# Patient Record
Sex: Female | Born: 1970
Health system: Southern US, Community
[De-identification: ages and names within clinical notes are randomized; demographics above are authoritative.]

## PROBLEM LIST (undated history)

## (undated) DIAGNOSIS — U071 COVID-19: Secondary | ICD-10-CM

## (undated) DIAGNOSIS — M797 Fibromyalgia: Secondary | ICD-10-CM

## (undated) DIAGNOSIS — F32A Depression, unspecified: Secondary | ICD-10-CM

## (undated) DIAGNOSIS — M1990 Unspecified osteoarthritis, unspecified site: Secondary | ICD-10-CM

## (undated) DIAGNOSIS — I10 Essential (primary) hypertension: Secondary | ICD-10-CM

## (undated) DIAGNOSIS — I2699 Other pulmonary embolism without acute cor pulmonale: Secondary | ICD-10-CM

## (undated) DIAGNOSIS — E119 Type 2 diabetes mellitus without complications: Secondary | ICD-10-CM

## (undated) DIAGNOSIS — K589 Irritable bowel syndrome without diarrhea: Secondary | ICD-10-CM

## (undated) DIAGNOSIS — F419 Anxiety disorder, unspecified: Secondary | ICD-10-CM

## (undated) HISTORY — PX: WRIST FRACTURE SURGERY: SHX121

## (undated) HISTORY — DX: Other pulmonary embolism without acute cor pulmonale: I26.99

## (undated) HISTORY — PX: OTHER SURGICAL HISTORY: SHX169

---
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
## 1999-07-09 ENCOUNTER — Ambulatory Visit (HOSPITAL_COMMUNITY): Admission: RE | Admit: 1999-07-09 | Discharge: 1999-07-09 | Payer: Self-pay | Admitting: Psychiatry

## 1999-07-20 ENCOUNTER — Ambulatory Visit (HOSPITAL_COMMUNITY): Admission: RE | Admit: 1999-07-20 | Discharge: 1999-07-20 | Payer: Self-pay | Admitting: Psychiatry

## 2001-12-07 ENCOUNTER — Encounter: Payer: Self-pay | Admitting: Emergency Medicine

## 2001-12-07 ENCOUNTER — Emergency Department (HOSPITAL_COMMUNITY): Admission: EM | Admit: 2001-12-07 | Discharge: 2001-12-07 | Payer: Self-pay | Admitting: Emergency Medicine

## 2003-06-23 ENCOUNTER — Emergency Department (HOSPITAL_COMMUNITY): Admission: EM | Admit: 2003-06-23 | Discharge: 2003-06-23 | Payer: Self-pay

## 2013-04-24 ENCOUNTER — Encounter (HOSPITAL_COMMUNITY): Payer: Self-pay | Admitting: Emergency Medicine

## 2013-04-24 ENCOUNTER — Emergency Department (HOSPITAL_COMMUNITY)
Admission: EM | Admit: 2013-04-24 | Discharge: 2013-04-24 | Disposition: A | Discharge status: Home / Self Care | Payer: BC Managed Care – PPO | Source: Home / Self Care | Attending: Family Medicine | Admitting: Family Medicine

## 2013-04-24 ENCOUNTER — Emergency Department (INDEPENDENT_AMBULATORY_CARE_PROVIDER_SITE_OTHER): Payer: BC Managed Care – PPO

## 2013-04-24 DIAGNOSIS — R0789 Other chest pain: Secondary | ICD-10-CM

## 2013-04-24 MED ORDER — DICLOFENAC POTASSIUM 50 MG PO TABS: 50.0000 mg | ORAL_TABLET | Freq: Three times a day (TID) | ORAL | Status: DC

## 2013-04-24 NOTE — ED Notes (Addendum)
Patient has concerned for weird feeling in chest, intermittent tightness, something doesn't feel right in chest.  Reports sitting in lobby felt fine.  Patient reports she feels fine until she starts thinking about chest and /or thinking of family/friends that have died recently or diagnosed with significant illnesses.  Patient recently returned to work out routine.  .  Patient reports the feeling is minor, its the location that concerns her.  Reports onset yesterday of symptoms

## 2013-04-24 NOTE — ED Provider Notes (Signed)
CSN: [id number]     Arrival date & time 04/24/13  0803 History   First MD Initiated Contact with Patient 04/24/13 [phone]     Chief Complaint  Patient presents with  . Muscle Pain   (Consider location/radiation/quality/duration/timing/severity/associated sxs/prior Treatment) Patient is a 42 y.o. female presenting with musculoskeletal pain. The history is provided by the patient.  Muscle Pain This is a new problem. The current episode started yesterday. The problem has been resolved. Associated symptoms include chest pain. Pertinent negatives include no abdominal pain, no headaches and no shortness of breath. Associated symptoms comments: 1 week ago started workout routine with weights, mother and friend with heart attakcks, pt worried.Marland Kitchen    History reviewed. No pertinent past medical history. History reviewed. No pertinent past surgical history. No family history on file. History  Substance Use Topics  . Smoking status: Current Every Day Smoker  . Smokeless tobacco: Not on file  . Alcohol Use: Yes   OB History   Grav Para Term Preterm Abortions TAB SAB Ect Mult Living                 Review of Systems  Constitutional: Negative.   Respiratory: Negative for cough, chest tightness and shortness of breath.   Cardiovascular: Positive for chest pain. Negative for palpitations and leg swelling.  Gastrointestinal: Negative.  Negative for abdominal pain.  Neurological: Negative for headaches.    Allergies  Review of patient's allergies indicates no known allergies.  Home Medications   Current Outpatient Rx  Name  Route  Sig  Dispense  Refill  . diclofenac (CATAFLAM) 50 MG tablet   Oral   Take 1 tablet (50 mg total) by mouth 3 (three) times daily. For chest pain   21 tablet   0    BP 154/86  Pulse 81  Temp(Src) 98.4 F (36.9 C) (Oral)  Resp 20  SpO2 98%  LMP 04/07/2013 Physical Exam  Nursing note and vitals reviewed. Constitutional: She is oriented to person, place, and  time. She appears well-developed and well-nourished.  HENT:  Head: Normocephalic.  Mouth/Throat: Oropharynx is clear and moist.  Eyes: Conjunctivae are normal. Pupils are equal, round, and reactive to light.  Neck: Normal range of motion. Neck supple.  Cardiovascular: Normal rate, regular rhythm, normal heart sounds and intact distal pulses.   Pulmonary/Chest: Effort normal and breath sounds normal. She exhibits tenderness.  Neurological: She is alert and oriented to person, place, and time.  Skin: Skin is warm and dry.    ED Course  Procedures (including critical care time) Labs Review Labs Reviewed - No data to display Imaging Review Dg Chest 2 View  04/24/2013   *RADIOLOGY REPORT*  Clinical Data: Left-sided chest pain, history smoking  CHEST - 2 VIEW  Comparison: None.  Findings:  Examination is degraded secondary to patient body habitus.  Normal cardiac silhouette and mediastinal contours. Lungs appear mildly hyperexpanded with mild diffuse slightly nodular thickening of the interstitium. Vertically aligned skin folds overlie the peripheral aspects of the bilateral lower lungs.  No definite pneumothorax.  No pleural effusion or evidence of pulmonary edema. No acute osseous abnormality.  IMPRESSION: Mild lung hyperexpansion and bronchitic change without acute cardiopulmonary disease.   Original Report Authenticated By: Tacey Ruiz, MD    MDM   1. Musculoskeletal chest pain    X-rays reviewed and report per radiologist.    Linna Hoff, MD 04/24/13 [phone]

## 2013-07-06 ENCOUNTER — Other Ambulatory Visit (HOSPITAL_COMMUNITY)
Admission: RE | Admit: 2013-07-06 | Discharge: 2013-07-06 | Disposition: A | Discharge status: Home / Self Care | Payer: BC Managed Care – PPO | Source: Ambulatory Visit | Attending: Family Medicine | Admitting: Family Medicine

## 2013-07-06 ENCOUNTER — Other Ambulatory Visit: Payer: Self-pay | Admitting: Family Medicine

## 2013-07-06 DIAGNOSIS — Z0000 Encounter for general adult medical examination without abnormal findings: Secondary | ICD-10-CM | POA: Insufficient documentation

## 2013-07-10 ENCOUNTER — Other Ambulatory Visit: Payer: Self-pay | Admitting: Family Medicine

## 2013-07-10 DIAGNOSIS — Z1231 Encounter for screening mammogram for malignant neoplasm of breast: Secondary | ICD-10-CM

## 2013-08-01 ENCOUNTER — Other Ambulatory Visit: Payer: Self-pay | Admitting: Family Medicine

## 2013-08-01 ENCOUNTER — Ambulatory Visit
Admission: RE | Admit: 2013-08-01 | Discharge: 2013-08-01 | Disposition: A | Discharge status: Home / Self Care | Payer: BC Managed Care – PPO | Source: Ambulatory Visit | Attending: Family Medicine | Admitting: Family Medicine

## 2013-08-01 DIAGNOSIS — M79609 Pain in unspecified limb: Secondary | ICD-10-CM

## 2013-08-23 DIAGNOSIS — I10 Essential (primary) hypertension: Secondary | ICD-10-CM

## 2013-08-23 HISTORY — DX: Essential (primary) hypertension: I10

## 2013-08-24 ENCOUNTER — Ambulatory Visit
Admission: RE | Admit: 2013-08-24 | Discharge: 2013-08-24 | Disposition: A | Discharge status: Home / Self Care | Payer: BC Managed Care – PPO | Source: Ambulatory Visit | Attending: Family Medicine | Admitting: Family Medicine

## 2013-08-24 DIAGNOSIS — Z1231 Encounter for screening mammogram for malignant neoplasm of breast: Secondary | ICD-10-CM

## 2013-08-28 ENCOUNTER — Other Ambulatory Visit: Payer: Self-pay | Admitting: Family Medicine

## 2013-08-28 DIAGNOSIS — N644 Mastodynia: Secondary | ICD-10-CM

## 2013-08-28 DIAGNOSIS — N63 Unspecified lump in unspecified breast: Secondary | ICD-10-CM

## 2013-09-05 ENCOUNTER — Ambulatory Visit
Admission: RE | Admit: 2013-09-05 | Discharge: 2013-09-05 | Disposition: A | Discharge status: Home / Self Care | Payer: BC Managed Care – PPO | Source: Ambulatory Visit | Attending: Family Medicine | Admitting: Family Medicine

## 2013-09-05 DIAGNOSIS — N63 Unspecified lump in unspecified breast: Secondary | ICD-10-CM

## 2013-09-05 DIAGNOSIS — N644 Mastodynia: Secondary | ICD-10-CM

## 2014-07-29 ENCOUNTER — Encounter (HOSPITAL_COMMUNITY): Payer: Self-pay

## 2014-07-29 ENCOUNTER — Emergency Department (HOSPITAL_COMMUNITY)
Admission: EM | Admit: 2014-07-29 | Discharge: 2014-07-29 | Disposition: A | Discharge status: Home / Self Care | Payer: BC Managed Care – PPO | Source: Home / Self Care | Attending: Family Medicine | Admitting: Family Medicine

## 2014-07-29 DIAGNOSIS — G5601 Carpal tunnel syndrome, right upper limb: Secondary | ICD-10-CM | POA: Diagnosis not present

## 2014-07-29 NOTE — ED Provider Notes (Signed)
CSN: [id number]     Arrival date & time 07/29/14  1255 History   First MD Initiated Contact with Patient 07/29/14 1407     Chief Complaint  Patient presents with  . Hand Problem   (Consider location/radiation/quality/duration/timing/severity/associated sxs/prior Treatment) Patient is a 43 y.o. female presenting with hand pain. The history is provided by the patient.  Hand Pain This is a new problem. The current episode started 6 to 12 hours ago. 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 no definite injury.). Pertinent negatives include no chest pain and no abdominal pain.    History reviewed. No pertinent past medical history. History reviewed. No pertinent past surgical history. History reviewed. No pertinent family history. History  Substance Use Topics  . Smoking status: Current Every Day Smoker  . Smokeless tobacco: Not on file  . Alcohol Use: Yes   OB History    No data available     Review of Systems  Constitutional: Negative.   Cardiovascular: Negative for chest pain.  Gastrointestinal: Negative for abdominal pain.  Musculoskeletal: Negative.   Skin: Negative.     Allergies  Review of patient's allergies indicates no known allergies.  Home Medications   Prior to Admission medications   Medication Sig Start Date End Date Taking? Authorizing Provider  diclofenac (CATAFLAM) 50 MG tablet Take 1 tablet (50 mg total) by mouth 3 (three) times daily. For chest pain 04/24/13   Linna HoffJames D Kitrina Maurin, MD   BP 154/95 mmHg  Pulse 90  Temp(Src) 99.1 F (37.3 C) (Oral)  Resp 18  SpO2 97% Physical Exam  Constitutional: She is oriented to person, place, and time. She appears well-developed and well-nourished.  Musculoskeletal: Normal range of motion. She exhibits tenderness.  No changes except increased sens to palp of finger., full rom.. Nl warmth.  Neurological: She is alert and  oriented to person, place, and time.  Skin: Skin is warm and dry. No rash noted. No erythema.  Nursing note and vitals reviewed.   ED Course  Procedures (including critical care time) Labs Review Labs Reviewed - No data to display  Imaging Review No results found.   MDM   1. Carpal tunnel syndrome on right        Linna HoffJames D Quiara Killian, MD 07/29/14 1455

## 2014-07-29 NOTE — ED Notes (Signed)
C/o episodic numbness, pain, swelling, hot/cold sensations in her right hand. 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 Denies injury

## 2015-04-05 ENCOUNTER — Encounter (HOSPITAL_COMMUNITY): Payer: Self-pay | Admitting: Emergency Medicine

## 2015-04-05 ENCOUNTER — Emergency Department (HOSPITAL_COMMUNITY)
Admission: EM | Admit: 2015-04-05 | Discharge: 2015-04-05 | Disposition: A | Discharge status: Home / Self Care | Payer: 59 | Attending: Emergency Medicine | Admitting: Emergency Medicine

## 2015-04-05 ENCOUNTER — Emergency Department (HOSPITAL_COMMUNITY): Payer: 59

## 2015-04-05 DIAGNOSIS — R1011 Right upper quadrant pain: Secondary | ICD-10-CM | POA: Diagnosis present

## 2015-04-05 DIAGNOSIS — Z79899 Other long term (current) drug therapy: Secondary | ICD-10-CM | POA: Diagnosis not present

## 2015-04-05 DIAGNOSIS — I10 Essential (primary) hypertension: Secondary | ICD-10-CM | POA: Diagnosis not present

## 2015-04-05 DIAGNOSIS — R000 Tachycardia, unspecified: Secondary | ICD-10-CM | POA: Insufficient documentation

## 2015-04-05 DIAGNOSIS — K8020 Calculus of gallbladder without cholecystitis without obstruction: Secondary | ICD-10-CM

## 2015-04-05 DIAGNOSIS — Z720 Tobacco use: Secondary | ICD-10-CM | POA: Diagnosis not present

## 2015-04-05 HISTORY — DX: Essential (primary) hypertension: I10

## 2015-04-05 LAB — CBC WITH DIFFERENTIAL/PLATELET
Basophils Absolute: 0 10*3/uL (ref 0.0–0.1)
Basophils Relative: 0 % (ref 0–1)
Eosinophils Absolute: 0.1 10*3/uL (ref 0.0–0.7)
Eosinophils Relative: 1 % (ref 0–5)
HCT: 43.8 % (ref 36.0–46.0)
Hemoglobin: 14.2 g/dL (ref 12.0–15.0)
Lymphocytes Relative: 21 % (ref 12–46)
Lymphs Abs: 2.8 10*3/uL (ref 0.7–4.0)
MCH: 28.1 pg (ref 26.0–34.0)
MCHC: 32.4 g/dL (ref 30.0–36.0)
MCV: 86.6 fL (ref 78.0–100.0)
Monocytes Absolute: 0.6 10*3/uL (ref 0.1–1.0)
Monocytes Relative: 4 % (ref 3–12)
Neutro Abs: 10.1 10*3/uL — ABNORMAL HIGH (ref 1.7–7.7)
Neutrophils Relative %: 74 % (ref 43–77)
Platelets: 431 10*3/uL — ABNORMAL HIGH (ref 150–400)
RBC: 5.06 MIL/uL (ref 3.87–5.11)
RDW: 14.5 % (ref 11.5–15.5)
WBC: 13.6 10*3/uL — ABNORMAL HIGH (ref 4.0–10.5)

## 2015-04-05 LAB — COMPREHENSIVE METABOLIC PANEL
ALT: 14 U/L (ref 14–54)
AST: 19 U/L (ref 15–41)
Albumin: 3.9 g/dL (ref 3.5–5.0)
Alkaline Phosphatase: 75 U/L (ref 38–126)
Anion gap: 10 (ref 5–15)
BUN: 13 mg/dL (ref 6–20)
CO2: 23 mmol/L (ref 22–32)
Calcium: 9 mg/dL (ref 8.9–10.3)
Chloride: 103 mmol/L (ref 101–111)
Creatinine, Ser: 0.61 mg/dL (ref 0.44–1.00)
GFR calc Af Amer: >60 mL/min (ref >60)
GFR calc non Af Amer: >60 mL/min (ref >60)
Glucose, Bld: 110 mg/dL — ABNORMAL HIGH (ref 65–99)
Potassium: 3.5 mmol/L (ref 3.5–5.1)
Sodium: 136 mmol/L (ref 135–145)
Total Bilirubin: 0.7 mg/dL (ref 0.3–1.2)
Total Protein: 7.9 g/dL (ref 6.5–8.1)

## 2015-04-05 LAB — I-STAT CG4 LACTIC ACID, ED: Lactic Acid, Venous: 2.15 mmol/L (ref 0.5–2.0)

## 2015-04-05 LAB — I-STAT CHEM 8, ED
BUN: 13 mg/dL (ref 6–20)
Calcium, Ion: 1.12 mmol/L (ref 1.12–1.23)
Chloride: 103 mmol/L (ref 101–111)
Creatinine, Ser: 0.6 mg/dL (ref 0.44–1.00)
Glucose, Bld: 112 mg/dL — ABNORMAL HIGH (ref 65–99)
HCT: 48 % — ABNORMAL HIGH (ref 36.0–46.0)
Hemoglobin: 16.3 g/dL — ABNORMAL HIGH (ref 12.0–15.0)
Potassium: 3.5 mmol/L (ref 3.5–5.1)
Sodium: 138 mmol/L (ref 135–145)
TCO2: 21 mmol/L (ref 0–100)

## 2015-04-05 LAB — LIPASE, BLOOD: Lipase: 16 U/L — ABNORMAL LOW (ref 22–51)

## 2015-04-05 MED ORDER — KETOROLAC TROMETHAMINE 30 MG/ML IJ SOLN
30.0000 mg | Freq: Once | INTRAMUSCULAR | Status: AC
  Administered 2015-04-05: 30 mg via INTRAVENOUS
  Filled 2015-04-05: qty 1

## 2015-04-05 MED ORDER — ONDANSETRON 4 MG PO TBDP: 4.0000 mg | ORAL_TABLET | Freq: Three times a day (TID) | ORAL | Status: DC | PRN

## 2015-04-05 MED ORDER — SODIUM CHLORIDE 0.9 % IV BOLUS (SEPSIS)
1000.0000 mL | Freq: Once | INTRAVENOUS | Status: AC
  Administered 2015-04-05: 1000 mL via INTRAVENOUS

## 2015-04-05 MED ORDER — TRAMADOL HCL 50 MG PO TABS: 50.0000 mg | ORAL_TABLET | Freq: Two times a day (BID) | ORAL | Status: DC | PRN

## 2015-04-05 NOTE — Discharge Instructions (Signed)
Cholelithiasis Holly Moon, your ultrasound results are below. Gallstones are causing your significant pain. Decrease your intake of fatty meals and spicy foods. Take tramadol for breakthrough pain. Use zofran as needed for nausea.  See surgery within 3 days for close follow up.  If symptoms worsen, come back to the ED immediately. Thank you. Cholelithiasis (also called gallstones) is a form of gallbladder disease. The gallbladder is a small organ that helps you digest fats. Symptoms of gallstones are:  Feeling sick to your stomach (nausea).  Throwing up (vomiting).  Belly pain.  Yellowing of the skin (jaundice).  Sudden pain. You may feel the pain for minutes to hours.  Fever.  Pain to the touch. HOME CARE  Only take medicines as told by your doctor.  Eat a low-fat diet until you see your doctor again. Eating fat can result in pain.  Follow up with your doctor as told. Attacks usually happen time after time. Surgery is usually needed for permanent treatment. GET HELP RIGHT AWAY IF:   Your pain gets worse.  Your pain is not helped by medicines.  You have a fever and lasting symptoms for more than 2-3 days.  You have a fever and your symptoms suddenly get worse.  You keep feeling sick to your stomach and throwing up. MAKE SURE YOU:   Understand these instructions.  Will watch your condition.  Will get help right away if you are not doing well or get worse. Document Released: 01/26/2008 Document Revised: 04/11/2013 Document Reviewed: 01/31/2013 [facility] Patient Information 2015 [place], [place]. This information is not intended to replace advice given to you by your health care provider. Make sure you discuss any questions you have with your health care provider.

## 2015-04-05 NOTE — ED Notes (Signed)
Pt returned from ultrasound

## 2015-04-05 NOTE — ED Notes (Signed)
Pt states she has pain under right rib and sometimes it transfers across under breast to left side

## 2015-04-05 NOTE — ED Provider Notes (Signed)
CSN: [id number]   Arrival date & time 04/05/15 0050  History  This chart was scribed for  Tomasita Crumble, MD by Bethel Born, ED Scribe. This patient was seen in room [facility] and the patient's care was started at 3:23 AM.  Chief Complaint  Patient presents with  . Flank Pain    HPI The history is provided by the patient. No language interpreter was used.   Holly Moon is a 44 y.o. female who presents to the Emergency Department complaining of constant right upper abdominal pain with sudden onset around 7 AM yesterday. The pain radiates to the back and across the abdomen.She describes the pain as burning and stabbing. Laying flat and deep breathing exacerbate the pain. Associated symptoms include nausea, heartburn, and anorexia (states that she last ate on 04/03/15). Pt denies vomiting and current diarrhea (notes an episode last week while on Augmentin for an ear infection), fever, dysuria, and hematuria. She saw her PCP at [facility] on 03/31/15 where she had lab work done.   Past Medical History  Diagnosis Date  . Hypertension 2015    Past Surgical History  Procedure Laterality Date  . Fracture surgery Left 1987    leg fracture    History reviewed. No pertinent family history.  Social History  Substance Use Topics  . Smoking status: Current Every Day Smoker  . Smokeless tobacco: None  . Alcohol Use: Yes     Review of Systems 10 Systems reviewed and all are negative for acute change except as noted in the HPI.  Home Medications   Prior to Admission medications   Medication Sig Start Date End Date Taking? Authorizing Provider  benazepril (LOTENSIN) 20 MG tablet Take 20 mg by mouth daily.   Yes Historical Provider, MD  buPROPion (WELLBUTRIN XL) 150 MG 24 hr tablet Take 150 mg by mouth daily.   Yes Historical Provider, MD  escitalopram (LEXAPRO) 20 MG tablet Take 20 mg by mouth daily.   Yes Historical Provider, MD  diclofenac (CATAFLAM) 50 MG tablet Take 1 tablet (50  mg total) by mouth 3 (three) times daily. For chest pain Patient not taking: Reported on 04/05/2015 04/24/13   Linna Hoff, MD    Allergies  Augmentin  Triage Vitals: BP 170/111 mmHg  Pulse 118  Temp(Src) 98.7 F (37.1 C) (Oral)  Resp 20  SpO2 98%  LMP 04/03/2015  Physical Exam  Constitutional: She is oriented to person, place, and time. She appears well-developed and well-nourished. No distress.  HENT:  Head: Normocephalic and atraumatic.  Nose: Nose normal.  Mouth/Throat: Oropharynx is clear and moist. No oropharyngeal exudate.  Eyes: Conjunctivae and EOM are normal. Pupils are equal, round, and reactive to light. No scleral icterus.  Neck: Normal range of motion. Neck supple. No JVD present. No tracheal deviation present. No thyromegaly present.  Cardiovascular: Regular rhythm and normal heart sounds.  Exam reveals no gallop and no friction rub.   No murmur heard. Tachycardia  Pulmonary/Chest: Effort normal and breath sounds normal. No respiratory distress. She has no wheezes. She exhibits no tenderness.  Abdominal: Soft. Bowel sounds are normal. She exhibits no distension and no mass. There is no tenderness. There is no rebound and no guarding.  Musculoskeletal: Normal range of motion. She exhibits no edema or tenderness.  Lymphadenopathy:    She has no cervical adenopathy.  Neurological: She is alert and oriented to person, place, and time. No cranial nerve deficit. She exhibits normal muscle tone.  Skin: Skin is  warm and dry. No rash noted. No erythema. No pallor.  Nursing note and vitals reviewed.   ED Course  Procedures   DIAGNOSTIC STUDIES: Oxygen Saturation is 98% on RA, normal by my interpretation.    COORDINATION OF CARE: 3:27 AM Discussed treatment plan which includes lab work, abdominal US, Toradol, and IVF with pt at bedside and pt agreed to plan.  Labs Review-  Labs Reviewed  CBC WITH DIFFERENTIAL/PLATELET - Abnormal; Notable for the following:    WBC  13.6 (*)    Platelets 431 (*)    Neutro Abs 10.1 (*)    All other components within normal limits  COMPREHENSIVE METABOLIC PANEL - Abnormal; Notable for the following:    Glucose, Bld 110 (*)    All other components within normal limits  LIPASE, BLOOD - Abnormal; Notable for the following:    Lipase 16 (*)    All other components within normal limits  I-STAT CHEM 8, ED - Abnormal; Notable for the following:    Glucose, Bld 112 (*)    Hemoglobin 16.3 (*)    HCT 48.0 (*)    All other components within normal limits  I-STAT CG4 LACTIC ACID, ED - Abnormal; Notable for the following:    Lactic Acid, Venous 2.15 (*)    All other components within normal limits    Imaging Review US Abdomen Complete  04/05/2015   CLINICAL DATA:  Acute onset of right upper quadrant and right flank pain. Initial encounter.  EXAM: ULTRASOUND ABDOMEN COMPLETE  COMPARISON:  None.  FINDINGS: Gallbladder: Multiple stones are seen dependently within the gallbladder, measuring up to 1.4 cm in size. No gallbladder wall thickening or pericholecystic fluid is seen. No ultrasonographic Murphy's sign is elicited.  Common bile duct: Diameter: 0.4 cm, within normal limits in caliber.  Liver: No focal lesion identified. Within normal limits in parenchymal echogenicity.  IVC: No abnormality visualized.  Pancreas: Not visualized.  Spleen: Size and appearance within normal limits.  Right Kidney: Length: 12.3 cm. Echogenicity within normal limits. No mass or hydronephrosis visualized.  Left Kidney: Length: 11.7 cm. Echogenicity within normal limits. No mass or hydronephrosis visualized.  Abdominal aorta: No aneurysm visualized. Not characterized proximally due to overlying bowel gas.  Other findings: None.  IMPRESSION: 1. Cholelithiasis noted; gallbladder otherwise unremarkable. 2. Otherwise unremarkable abdominal ultrasound.   Electronically Signed   By: Roanna Raider M.D.   On: 04/05/2015 05:01    EKG  Interpretation None      MDM   Final diagnoses:  None    Patient presents emergency department for right upper quadrant abdominal pain which radiates to her flank and mid abdomen. She denies it being worse with food however I have concern for gallbladder pathology. Will obtain ultrasound for evaluation. Patient was given IV fluids, and Toradol for pain control.  Ultrasound shows several gallstones in the gallbladder. This is likely the cause of her symptoms. Patient was given education regarding this diagnosis. She demonstrated understanding. Surgical follow-up was provided. Tachycardia has resolved to 70 after 1 L of IV fluids and Toradol. She'll be discharged with tramadol and Zofran to take at home as needed. She otherwise appears well and in no acute distress. Her vital signs were within her normal limits and she is safe for discharge.  I personally performed the services described in this documentation, which was scribed in my presence. The recorded information has been reviewed and is accurate.   Tomasita Crumble, MD 04/05/15 [phone]

## 2015-04-05 NOTE — ED Notes (Signed)
Pt arrived to the ED with a complaint of right sided flank pain with radiation to the both the upper back and abdomen.  Pt has experienced pain since Friday at 0700 hrs.  Pt states the pain is a throbbing pressure pain.

## 2016-11-07 ENCOUNTER — Encounter (HOSPITAL_COMMUNITY): Payer: Self-pay | Admitting: Emergency Medicine

## 2016-11-07 ENCOUNTER — Emergency Department (HOSPITAL_COMMUNITY): Payer: BLUE CROSS/BLUE SHIELD

## 2016-11-07 ENCOUNTER — Emergency Department (HOSPITAL_COMMUNITY)
Admission: EM | Admit: 2016-11-07 | Discharge: 2016-11-07 | Disposition: A | Discharge status: Home / Self Care | Payer: BLUE CROSS/BLUE SHIELD | Attending: Emergency Medicine | Admitting: Emergency Medicine

## 2016-11-07 DIAGNOSIS — F17200 Nicotine dependence, unspecified, uncomplicated: Secondary | ICD-10-CM | POA: Insufficient documentation

## 2016-11-07 DIAGNOSIS — I10 Essential (primary) hypertension: Secondary | ICD-10-CM | POA: Insufficient documentation

## 2016-11-07 DIAGNOSIS — K5732 Diverticulitis of large intestine without perforation or abscess without bleeding: Secondary | ICD-10-CM | POA: Diagnosis not present

## 2016-11-07 DIAGNOSIS — Z7982 Long term (current) use of aspirin: Secondary | ICD-10-CM | POA: Diagnosis not present

## 2016-11-07 DIAGNOSIS — R1032 Left lower quadrant pain: Secondary | ICD-10-CM | POA: Diagnosis present

## 2016-11-07 DIAGNOSIS — R1031 Right lower quadrant pain: Secondary | ICD-10-CM

## 2016-11-07 LAB — URINALYSIS, ROUTINE W REFLEX MICROSCOPIC
Bilirubin Urine: NEGATIVE
Glucose, UA: NEGATIVE mg/dL
Hgb urine dipstick: NEGATIVE
Ketones, ur: 20 mg/dL — AB
Leukocytes, UA: NEGATIVE
Nitrite: NEGATIVE
Protein, ur: NEGATIVE mg/dL
Specific Gravity, Urine: 1.02 (ref 1.005–1.030)
pH: 5 (ref 5.0–8.0)

## 2016-11-07 LAB — COMPREHENSIVE METABOLIC PANEL
ALT: 15 U/L (ref 14–54)
AST: 15 U/L (ref 15–41)
Albumin: 4 g/dL (ref 3.5–5.0)
Alkaline Phosphatase: 54 U/L (ref 38–126)
Anion gap: 7 (ref 5–15)
BUN: 16 mg/dL (ref 6–20)
CO2: 25 mmol/L (ref 22–32)
Calcium: 9.4 mg/dL (ref 8.9–10.3)
Chloride: 106 mmol/L (ref 101–111)
Creatinine, Ser: 0.73 mg/dL (ref 0.44–1.00)
GFR calc Af Amer: >60 mL/min (ref >60)
GFR calc non Af Amer: >60 mL/min (ref >60)
Glucose, Bld: 112 mg/dL — ABNORMAL HIGH (ref 65–99)
Potassium: 3.7 mmol/L (ref 3.5–5.1)
Sodium: 138 mmol/L (ref 135–145)
Total Bilirubin: 0.6 mg/dL (ref 0.3–1.2)
Total Protein: 7.6 g/dL (ref 6.5–8.1)

## 2016-11-07 LAB — PREGNANCY, URINE: Preg Test, Ur: NEGATIVE

## 2016-11-07 LAB — CBC
HCT: 45 % (ref 36.0–46.0)
Hemoglobin: 14.8 g/dL (ref 12.0–15.0)
MCH: 28.2 pg (ref 26.0–34.0)
MCHC: 32.9 g/dL (ref 30.0–36.0)
MCV: 85.7 fL (ref 78.0–100.0)
Platelets: 485 10*3/uL — ABNORMAL HIGH (ref 150–400)
RBC: 5.25 MIL/uL — ABNORMAL HIGH (ref 3.87–5.11)
RDW: 15.5 % (ref 11.5–15.5)
WBC: 13.2 10*3/uL — ABNORMAL HIGH (ref 4.0–10.5)

## 2016-11-07 LAB — LIPASE, BLOOD: Lipase: 14 U/L (ref 11–51)

## 2016-11-07 LAB — WET PREP, GENITAL
Sperm: NONE SEEN
Trich, Wet Prep: NONE SEEN
Yeast Wet Prep HPF POC: NONE SEEN

## 2016-11-07 MED ORDER — HYDROCODONE-ACETAMINOPHEN 5-325 MG PO TABS: 1.0000 | ORAL_TABLET | ORAL | 0 refills | Status: DC | PRN

## 2016-11-07 MED ORDER — MORPHINE SULFATE (PF) 4 MG/ML IV SOLN
4.0000 mg | Freq: Once | INTRAVENOUS | Status: AC
  Administered 2016-11-07: 4 mg via INTRAVENOUS
  Filled 2016-11-07: qty 1

## 2016-11-07 MED ORDER — CIPROFLOXACIN HCL 500 MG PO TABS
500.0000 mg | ORAL_TABLET | Freq: Once | ORAL | Status: AC
  Administered 2016-11-07: 500 mg via ORAL
  Filled 2016-11-07: qty 1

## 2016-11-07 MED ORDER — SODIUM CHLORIDE 0.9 % IV BOLUS (SEPSIS)
1000.0000 mL | Freq: Once | INTRAVENOUS | Status: AC
  Administered 2016-11-07: 1000 mL via INTRAVENOUS

## 2016-11-07 MED ORDER — METRONIDAZOLE 500 MG PO TABS
500.0000 mg | ORAL_TABLET | Freq: Once | ORAL | Status: AC
  Administered 2016-11-07: 500 mg via ORAL
  Filled 2016-11-07: qty 1

## 2016-11-07 MED ORDER — IOPAMIDOL (ISOVUE-300) INJECTION 61%
INTRAVENOUS | Status: AC
  Administered 2016-11-07: 100 mL
  Filled 2016-11-07: qty 100

## 2016-11-07 MED ORDER — CIPROFLOXACIN HCL 500 MG PO TABS: 500.0000 mg | ORAL_TABLET | Freq: Two times a day (BID) | ORAL | 0 refills | Status: DC

## 2016-11-07 MED ORDER — METRONIDAZOLE 500 MG PO TABS: 500.0000 mg | ORAL_TABLET | Freq: Three times a day (TID) | ORAL | 0 refills | Status: DC

## 2016-11-07 NOTE — ED Provider Notes (Signed)
[facility]-EMERGENCY DEPT Provider Note   CSN: [id number] Arrival date & time: 11/07/16  1802     History   Chief Complaint Chief Complaint  Patient presents with  . Abdominal Pain  . Urinary Frequency    HPI Holly Moon is a 46 y.o. female G0 presenting with 1 day of lower abdominal pain which is worse with movement and better when lying down predominantly on the left side and radiating across her lower abdomen. She also reports nausea from the pain but no vomiting. Has had urinary frequency but no burning or pain with urination. She reports history of constipation but has had a normal bowel movement yesterday. She also reports a recent history of  participating in [employer] and change in diet. She has not tried anything for the pain. Denies fever, chills, diarrhea, hematuria, blood in her stool. LMP 10/21/2016 HPI  Past Medical History:  Diagnosis Date  . Hypertension 2015    There are no active problems to display for this patient.   Past Surgical History:  Procedure Laterality Date  . FRACTURE SURGERY Left 1987   leg fracture    OB History    No data available       Home Medications    Prior to Admission medications   Medication Sig Start Date End Date Taking? Authorizing Provider  aspirin EC 325 MG tablet Take 325 mg by mouth daily as needed (headache).   Yes Historical Provider, MD  benazepril (LOTENSIN) 20 MG tablet Take 20 mg by mouth daily.   Yes Historical Provider, MD    Family History No family history on file.  Social History Social History  Substance Use Topics  . Smoking status: Current Every Day Smoker    Packs/day: 0.05  . Smokeless tobacco: Not on file  . Alcohol use Yes     Comment: very rarely      Allergies   Patient has no active allergies.   Review of Systems Review of Systems  Constitutional: Negative for appetite change, chills and fever.  HENT: Negative for congestion, ear pain and sore throat.   Eyes: Negative for  visual disturbance.  Respiratory: Negative for cough, chest tightness, shortness of breath, wheezing and stridor.   Cardiovascular: Negative for chest pain, palpitations and leg swelling.  Gastrointestinal: Positive for abdominal pain, constipation and nausea. Negative for abdominal distention, anal bleeding, blood in stool, diarrhea and vomiting.       LLQ  /suprapubic pain   Genitourinary: Positive for frequency. Negative for decreased urine volume, difficulty urinating, dysuria, flank pain, genital sores, hematuria, menstrual problem, urgency, vaginal bleeding, vaginal discharge and vaginal pain.  Musculoskeletal: Negative for back pain, gait problem, myalgias, neck pain and neck stiffness.  Skin: Negative for color change, pallor, rash and wound.  Neurological: Negative for dizziness, seizures, syncope, weakness, light-headedness and headaches.  Psychiatric/Behavioral: Negative for behavioral problems.     Physical Exam Updated Vital Signs BP 140/79 (BP Location: Left Arm)   Pulse (!) 101   Temp 98.1 F (36.7 C) (Oral)   Resp 18   Ht 5\' 5"  (1.651 m)   Wt 127 kg   SpO2 98%   BMI 46.59 kg/m   Physical Exam  Constitutional: She appears well-developed and well-nourished. No distress.  Patient is afebrile, nontoxic-appearing, lying in bed in mild discomfort in no acute distress.  HENT:  Head: Normocephalic and atraumatic.  Eyes: Conjunctivae are normal.  Neck: Normal range of motion. Neck supple.  Cardiovascular: Normal rate, regular rhythm  and normal heart sounds.   No murmur heard. Pulmonary/Chest: Effort normal and breath sounds normal. No respiratory distress. She has no wheezes. She has no rales. She exhibits no tenderness.  Abdominal: Soft. Bowel sounds are normal. She exhibits no distension and no mass. There is tenderness. There is no rebound and no guarding.  Mild left lower suprapubic tenderness to palpation.  Genitourinary: Vagina normal. No vaginal discharge found.    Genitourinary Comments: Some white discharge. No cervical motion tenderness. Adnexal mass or tenderness to palpation of adnexa.  Musculoskeletal: She exhibits no edema.  Neurological: She is alert.  Skin: Skin is warm. No rash noted. She is not diaphoretic. No erythema. No pallor.  Psychiatric: She has a normal mood and affect.  Nursing note and vitals reviewed.    ED Treatments / Results  Labs (all labs ordered are listed, but only abnormal results are displayed) Labs Reviewed  WET PREP, GENITAL - Abnormal; Notable for the following:       Result Value   Clue Cells Wet Prep HPF POC PRESENT (*)    WBC, Wet Prep HPF POC FEW (*)    All other components within normal limits  COMPREHENSIVE METABOLIC PANEL - Abnormal; Notable for the following:    Glucose, Bld 112 (*)    All other components within normal limits  CBC - Abnormal; Notable for the following:    WBC 13.2 (*)    RBC 5.25 (*)    Platelets 485 (*)    All other components within normal limits  URINALYSIS, ROUTINE W REFLEX MICROSCOPIC - Abnormal; Notable for the following:    Ketones, ur 20 (*)    All other components within normal limits  LIPASE, BLOOD  PREGNANCY, URINE  GC/CHLAMYDIA PROBE AMP ([place]) NOT AT [facility]    EKG  EKG Interpretation None       Radiology Ct Abdomen Pelvis W Contrast  Result Date: 11/07/2016 CLINICAL DATA:  Left-sided abdominal pain. EXAM: CT ABDOMEN AND PELVIS WITH CONTRAST TECHNIQUE: Multidetector CT imaging of the abdomen and pelvis was performed using the standard protocol following bolus administration of intravenous contrast. CONTRAST:  ISOVUE-300 IOPAMIDOL (ISOVUE-300) INJECTION 61% COMPARISON:  Abdominal ultrasound 04/05/2015 FINDINGS: Lower chest: No acute abnormality. Hepatobiliary: No focal liver abnormality is seen. No gallbladder wall thickening, or biliary dilatation. Biphasic attenuation of the gallbladder suggests layering gallbladder sludge or stones. Pancreas:  Unremarkable. No pancreatic ductal dilatation or surrounding inflammatory changes. Spleen: Normal in size without focal abnormality. Adrenals/Urinary Tract: Adrenal glands are unremarkable. Kidneys are normal, without renal calculi, focal lesion, or hydronephrosis. Bladder is unremarkable. Stomach/Bowel: Stomach is within normal limits. No evidence of appendicitis. No evidence of small bowel wall thickening, distention, or inflammatory changes. Diffuse left colonic diverticulosis with a relatively long segment of circumferential mucosal thickening of the sigmoid colon with pericolonic inflammatory fat stranding, particularly in the proximal sigmoid colon. Findings suggestive of diverticulitis. No evidence of abscess formation or rupture. Vascular/Lymphatic: No significant vascular findings are present. No enlarged abdominal or pelvic lymph nodes. Reproductive: Uterus and bilateral adnexa are unremarkable. Other: No abdominal wall hernia or abnormality. No abdominopelvic ascites. Musculoskeletal: No acute or significant osseous findings. IMPRESSION: Long segment of irregular mucosal thickening and pericolonic inflammatory changes of the sigmoid colon, on the background of advanced diverticulosis. The findings are most suggestive of diverticulitis, without evidence of rupture or abscess formation. Although malignancy cannot be entirely excluded, it is considered less likely with this appearance. Correlation with colonoscopy, after resolution of the acute  symptoms is recommended. Electronically Signed   By: Ted Mcalpineobrinka  Dimitrova M.D.   On: 11/07/2016 22:04    Procedures Procedures (including critical care time)  Medications Ordered in ED Medications  sodium chloride 0.9 % bolus 1,000 mL (0 mLs Intravenous Stopped 11/07/16 2200)  morphine 4 MG/ML injection 4 mg (4 mg Intravenous Given 11/07/16 2019)  iopamidol (ISOVUE-300) 61 % injection (100 mLs  Contrast Given 11/07/16 2141)  sodium chloride 0.9 % bolus 1,000 mL  (1,000 mLs Intravenous New Bag/Given 11/07/16 2202)     Initial Impression / Assessment and Plan / ED Course  I have reviewed the triage vital signs and the nursing notes.  Pertinent labs & imaging results that were available during my care of the patient were reviewed by me and considered in my medical decision making (see chart for details).   Patient presents with one days of lower suprapubic discomfort and urinary frequency. Tachycardic on arrival. Given IV fluids. Exam unremarkable, other than mild discomfort to palpation of LLQ/ lower suprapubic region > on the left.  Labs with mild leukocytosis Pelvic exam without abnormal discharge or cervical motion tenderness  Patient's pain was managed while in ED. She reported improvement on reassessment. Patient was discussed with Dr. Erma HeritageIsaacs who agrees with assessment and plan.  Ordered CT abdomen and pelvis  Transferred patient care at end of shift to [place], PA-C pending CT results. Anticipate DC home if negative and/or treat as appropriate based on CT results.  Final Clinical Impressions(s) / ED Diagnoses   Final diagnoses:  RLQ abdominal pain    New Prescriptions New Prescriptions   No medications on file     Gregary CromerJessica B Wilhelmine Krogstad, PA-C 11/07/16 2231    Shaune Pollackameron Isaacs, MD 11/08/16 [phone]

## 2016-11-07 NOTE — ED Triage Notes (Addendum)
Pt from home with complaints of abdominal pain that she describes as lower abdominal pain that she describes as "excruciating pressure". Pt state she has had urinary pressure with frequency. Pt states the pain move around her abdomen but is mostly in her lower front abdomen. Pain began last night

## 2016-11-07 NOTE — Discharge Instructions (Signed)
Follow up with gastroenterology for further evaluation and to discuss colonoscopy. Return to the emergency department if you develop a fever, severe pain, bloody stools, or other concern.

## 2016-11-07 NOTE — ED Provider Notes (Signed)
Patient signed out at the end of shift by Mathews RobinsonsJessica Mitchell, PA-C, after evaluation of abdominal pain, nausea. She has not had any fever. Getting CT r/o diverticulitis.    CT results supportive diagnosis of diverticulitis. Re-evaluation of the patient finds her comfortable. She has mild diffuse abdominal tenderness that is greatest in the LLQ. Discussed results with patient and family. Will start Cipro and Flagyl, give her something for pain and referral to GI. Discussed need for colonoscopy based on CT results.   Elpidio AnisShari Hoang Reich, PA-C 11/07/16 [id number]    Shaune Pollackameron Isaacs, MD 11/08/16 [phone]

## 2016-11-07 NOTE — ED Notes (Signed)
Patient reports that since November she has been dieting and exercising and has lost 55lbs.  Patient reports that she has has issues with constipation since diet change.  Yesterday patient reports taking laxative to help with constipation and had large BM, then small amount of BM today.  Patient was told by PCP to start taking Colace daily but patient denies starting yet.  Patient states that today has noticed increase in frequency of urination but denies any blood or dysuria.

## 2016-11-07 NOTE — ED Notes (Signed)
ED Provider at bedside. 

## 2016-11-08 LAB — GC/CHLAMYDIA PROBE AMP (~~LOC~~) NOT AT ARMC
Chlamydia: NEGATIVE
Neisseria Gonorrhea: NEGATIVE

## 2016-11-15 ENCOUNTER — Inpatient Hospital Stay
Admit: 2016-11-15 | Discharge: 2016-11-15 | Disposition: A | Discharge status: Home / Self Care | Payer: BLUE CROSS/BLUE SHIELD

## 2016-11-15 ENCOUNTER — Emergency Department: Admit: 2016-11-15 | Payer: BLUE CROSS/BLUE SHIELD

## 2016-11-15 DIAGNOSIS — S32019A Unspecified fracture of first lumbar vertebra, initial encounter for closed fracture: Principal | ICD-10-CM

## 2016-11-15 MED ORDER — CYCLOBENZAPRINE HCL 10 MG PO TABS
10 MG | ORAL_TABLET | Freq: Three times a day (TID) | ORAL | 0 refills | Status: AC | PRN
Start: 2016-11-15 — End: 2016-11-25

## 2016-11-15 MED ORDER — TETANUS-DIPHTH-ACELL PERTUSSIS 5-2.5-18.5 LF-MCG/0.5 IM SUSP
Freq: Once | INTRAMUSCULAR | Status: AC
Start: 2016-11-15 — End: 2016-11-15
  Administered 2016-11-15: 20:00:00 0.5 mL via INTRAMUSCULAR

## 2016-11-15 MED ORDER — KETOROLAC TROMETHAMINE 60 MG/2ML IM SOLN
60 MG/2ML | Freq: Once | INTRAMUSCULAR | Status: AC
Start: 2016-11-15 — End: 2016-11-15
  Administered 2016-11-15: 20:00:00 60 mg via INTRAMUSCULAR

## 2016-11-15 MED ORDER — ORPHENADRINE CITRATE 30 MG/ML IJ SOLN
30 MG/ML | Freq: Once | INTRAMUSCULAR | Status: AC
Start: 2016-11-15 — End: 2016-11-15
  Administered 2016-11-15: 20:00:00 60 mg via INTRAMUSCULAR

## 2016-11-15 MED ORDER — ETODOLAC 400 MG PO TABS
400 MG | ORAL_TABLET | Freq: Two times a day (BID) | ORAL | 0 refills | Status: AC
Start: 2016-11-15 — End: ?

## 2016-11-15 MED FILL — KETOROLAC TROMETHAMINE 60 MG/2ML IM SOLN: 60 MG/2ML | INTRAMUSCULAR | Qty: 2

## 2016-11-15 MED FILL — ORPHENADRINE CITRATE 30 MG/ML IJ SOLN: 30 MG/ML | INTRAMUSCULAR | Qty: 2

## 2016-11-15 MED FILL — BOOSTRIX 5-2.5-18.5 IM SUSP: INTRAMUSCULAR | Qty: 0.5
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
## 2017-01-19 ENCOUNTER — Other Ambulatory Visit: Payer: Self-pay | Admitting: Orthopedic Surgery

## 2017-01-19 DIAGNOSIS — M542 Cervicalgia: Secondary | ICD-10-CM

## 2017-01-30 ENCOUNTER — Ambulatory Visit
Admission: RE | Admit: 2017-01-30 | Discharge: 2017-01-30 | Disposition: A | Discharge status: Home / Self Care | Payer: BLUE CROSS/BLUE SHIELD | Source: Ambulatory Visit | Attending: Orthopedic Surgery | Admitting: Orthopedic Surgery

## 2017-01-30 DIAGNOSIS — M542 Cervicalgia: Secondary | ICD-10-CM

## 2017-03-07 ENCOUNTER — Other Ambulatory Visit: Payer: Self-pay | Admitting: Orthopedic Surgery

## 2017-03-07 DIAGNOSIS — M545 Low back pain: Secondary | ICD-10-CM

## 2017-03-23 ENCOUNTER — Ambulatory Visit
Admission: RE | Admit: 2017-03-23 | Discharge: 2017-03-23 | Disposition: A | Discharge status: Home / Self Care | Payer: BLUE CROSS/BLUE SHIELD | Source: Ambulatory Visit | Attending: Orthopedic Surgery | Admitting: Orthopedic Surgery

## 2017-03-23 DIAGNOSIS — M545 Low back pain: Secondary | ICD-10-CM

## 2017-04-06 ENCOUNTER — Other Ambulatory Visit: Payer: Self-pay | Admitting: Orthopedic Surgery

## 2017-04-06 DIAGNOSIS — G8929 Other chronic pain: Secondary | ICD-10-CM

## 2017-04-06 DIAGNOSIS — M545 Low back pain: Principal | ICD-10-CM

## 2017-04-14 ENCOUNTER — Ambulatory Visit
Admission: RE | Admit: 2017-04-14 | Discharge: 2017-04-14 | Disposition: A | Discharge status: Home / Self Care | Payer: BLUE CROSS/BLUE SHIELD | Source: Ambulatory Visit | Attending: Orthopedic Surgery | Admitting: Orthopedic Surgery

## 2017-04-14 DIAGNOSIS — G8929 Other chronic pain: Secondary | ICD-10-CM

## 2017-04-14 DIAGNOSIS — M545 Low back pain: Principal | ICD-10-CM

## 2017-04-14 MED ORDER — METHYLPREDNISOLONE ACETATE 40 MG/ML INJ SUSP (RADIOLOG
120.0000 mg | Freq: Once | INTRAMUSCULAR | Status: AC
  Administered 2017-04-14: 120 mg via EPIDURAL

## 2017-04-14 MED ORDER — IOPAMIDOL (ISOVUE-M 200) INJECTION 41%
1.0000 mL | Freq: Once | INTRAMUSCULAR | Status: AC
  Administered 2017-04-14: 1 mL via EPIDURAL

## 2017-04-14 NOTE — Discharge Instructions (Signed)

## 2017-05-04 DIAGNOSIS — Z6841 Body Mass Index (BMI) 40.0 and over, adult: Secondary | ICD-10-CM | POA: Insufficient documentation

## 2017-05-11 ENCOUNTER — Other Ambulatory Visit: Payer: Self-pay | Admitting: Orthopedic Surgery

## 2017-05-11 DIAGNOSIS — M545 Low back pain, unspecified: Secondary | ICD-10-CM

## 2017-05-11 DIAGNOSIS — G8929 Other chronic pain: Secondary | ICD-10-CM

## 2017-05-27 ENCOUNTER — Ambulatory Visit
Admission: RE | Admit: 2017-05-27 | Discharge: 2017-05-27 | Disposition: A | Discharge status: Home / Self Care | Payer: BLUE CROSS/BLUE SHIELD | Source: Ambulatory Visit | Attending: Orthopedic Surgery | Admitting: Orthopedic Surgery

## 2017-05-27 DIAGNOSIS — M545 Low back pain, unspecified: Secondary | ICD-10-CM

## 2017-05-27 DIAGNOSIS — G8929 Other chronic pain: Secondary | ICD-10-CM

## 2017-05-27 MED ORDER — METHYLPREDNISOLONE ACETATE 40 MG/ML INJ SUSP (RADIOLOG
120.0000 mg | Freq: Once | INTRAMUSCULAR | Status: AC
  Administered 2017-05-27: 120 mg via EPIDURAL

## 2017-05-27 MED ORDER — IOPAMIDOL (ISOVUE-M 200) INJECTION 41%
1.0000 mL | Freq: Once | INTRAMUSCULAR | Status: AC
  Administered 2017-05-27: 1 mL via EPIDURAL

## 2017-05-27 NOTE — Discharge Instructions (Signed)

## 2017-06-22 ENCOUNTER — Other Ambulatory Visit: Payer: Self-pay | Admitting: Orthopedic Surgery

## 2017-06-22 DIAGNOSIS — M25551 Pain in right hip: Secondary | ICD-10-CM

## 2017-06-27 ENCOUNTER — Other Ambulatory Visit: Payer: Self-pay | Admitting: Family Medicine

## 2017-06-27 DIAGNOSIS — Z1231 Encounter for screening mammogram for malignant neoplasm of breast: Secondary | ICD-10-CM

## 2017-07-08 ENCOUNTER — Ambulatory Visit
Admission: RE | Admit: 2017-07-08 | Discharge: 2017-07-08 | Disposition: A | Discharge status: Home / Self Care | Payer: BLUE CROSS/BLUE SHIELD | Source: Ambulatory Visit | Attending: Orthopedic Surgery | Admitting: Orthopedic Surgery

## 2017-07-08 DIAGNOSIS — M25551 Pain in right hip: Secondary | ICD-10-CM

## 2017-07-08 MED ORDER — IOPAMIDOL (ISOVUE-M 200) INJECTION 41%
15.0000 mL | Freq: Once | INTRAMUSCULAR | Status: AC
  Administered 2017-07-08: 15 mL via INTRA_ARTICULAR

## 2017-08-04 ENCOUNTER — Other Ambulatory Visit: Payer: Self-pay | Admitting: Orthopedic Surgery

## 2017-08-04 DIAGNOSIS — G8929 Other chronic pain: Secondary | ICD-10-CM

## 2017-08-04 DIAGNOSIS — M545 Low back pain: Principal | ICD-10-CM

## 2017-08-10 ENCOUNTER — Ambulatory Visit
Admission: RE | Admit: 2017-08-10 | Discharge: 2017-08-10 | Disposition: A | Discharge status: Home / Self Care | Payer: BLUE CROSS/BLUE SHIELD | Source: Ambulatory Visit | Attending: Family Medicine | Admitting: Family Medicine

## 2017-08-10 DIAGNOSIS — Z1231 Encounter for screening mammogram for malignant neoplasm of breast: Secondary | ICD-10-CM

## 2017-08-22 ENCOUNTER — Ambulatory Visit
Admission: RE | Admit: 2017-08-22 | Discharge: 2017-08-22 | Disposition: A | Discharge status: Home / Self Care | Payer: BLUE CROSS/BLUE SHIELD | Source: Ambulatory Visit | Attending: Orthopedic Surgery | Admitting: Orthopedic Surgery

## 2017-08-22 DIAGNOSIS — M545 Low back pain: Principal | ICD-10-CM

## 2017-08-22 DIAGNOSIS — G8929 Other chronic pain: Secondary | ICD-10-CM

## 2017-08-22 MED ORDER — METHYLPREDNISOLONE ACETATE 40 MG/ML INJ SUSP (RADIOLOG: 120.0000 mg | Freq: Once | INTRAMUSCULAR | Status: DC

## 2017-08-22 MED ORDER — IOPAMIDOL (ISOVUE-M 200) INJECTION 41%: 1.0000 mL | Freq: Once | INTRAMUSCULAR | Status: DC

## 2017-08-22 NOTE — Discharge Instructions (Signed)

## 2017-12-26 ENCOUNTER — Other Ambulatory Visit: Payer: Self-pay | Admitting: Orthopedic Surgery

## 2017-12-26 DIAGNOSIS — G8929 Other chronic pain: Secondary | ICD-10-CM

## 2017-12-26 DIAGNOSIS — M545 Low back pain: Principal | ICD-10-CM

## 2018-01-03 ENCOUNTER — Ambulatory Visit
Admission: RE | Admit: 2018-01-03 | Discharge: 2018-01-03 | Disposition: A | Discharge status: Home / Self Care | Payer: BLUE CROSS/BLUE SHIELD | Source: Ambulatory Visit | Attending: Orthopedic Surgery | Admitting: Orthopedic Surgery

## 2018-01-03 DIAGNOSIS — M545 Low back pain: Principal | ICD-10-CM

## 2018-01-03 DIAGNOSIS — G8929 Other chronic pain: Secondary | ICD-10-CM

## 2018-01-03 MED ORDER — IOPAMIDOL (ISOVUE-M 200) INJECTION 41%
1.0000 mL | Freq: Once | INTRAMUSCULAR | Status: AC
  Administered 2018-01-03: 1 mL via EPIDURAL

## 2018-01-03 MED ORDER — METHYLPREDNISOLONE ACETATE 40 MG/ML INJ SUSP (RADIOLOG
120.0000 mg | Freq: Once | INTRAMUSCULAR | Status: AC
  Administered 2018-01-03: 120 mg via EPIDURAL

## 2018-01-03 NOTE — Discharge Instructions (Signed)
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
## 2018-02-17 ENCOUNTER — Encounter (INDEPENDENT_AMBULATORY_CARE_PROVIDER_SITE_OTHER): Payer: Self-pay

## 2018-02-17 ENCOUNTER — Ambulatory Visit (INDEPENDENT_AMBULATORY_CARE_PROVIDER_SITE_OTHER): Payer: Self-pay

## 2018-02-17 ENCOUNTER — Ambulatory Visit: Payer: BLUE CROSS/BLUE SHIELD | Admitting: Rheumatology

## 2018-02-17 ENCOUNTER — Encounter: Payer: Self-pay | Admitting: Rheumatology

## 2018-02-17 VITALS — BP 150/96 | HR 80 | Ht 66.0 in | Wt 342.0 lb

## 2018-02-17 DIAGNOSIS — R52 Pain, unspecified: Secondary | ICD-10-CM

## 2018-02-17 DIAGNOSIS — M5136 Other intervertebral disc degeneration, lumbar region: Secondary | ICD-10-CM | POA: Diagnosis not present

## 2018-02-17 DIAGNOSIS — Z8679 Personal history of other diseases of the circulatory system: Secondary | ICD-10-CM | POA: Insufficient documentation

## 2018-02-17 DIAGNOSIS — Z86711 Personal history of pulmonary embolism: Secondary | ICD-10-CM | POA: Diagnosis not present

## 2018-02-17 DIAGNOSIS — G8929 Other chronic pain: Secondary | ICD-10-CM

## 2018-02-17 DIAGNOSIS — M533 Sacrococcygeal disorders, not elsewhere classified: Secondary | ICD-10-CM

## 2018-02-17 DIAGNOSIS — Z9889 Other specified postprocedural states: Secondary | ICD-10-CM | POA: Insufficient documentation

## 2018-02-17 DIAGNOSIS — R197 Diarrhea, unspecified: Secondary | ICD-10-CM

## 2018-02-17 DIAGNOSIS — Z8659 Personal history of other mental and behavioral disorders: Secondary | ICD-10-CM | POA: Insufficient documentation

## 2018-02-28 DIAGNOSIS — Z1589 Genetic susceptibility to other disease: Secondary | ICD-10-CM | POA: Insufficient documentation

## 2018-03-14 ENCOUNTER — Ambulatory Visit: Payer: Self-pay | Admitting: Rheumatology

## 2018-05-12 ENCOUNTER — Other Ambulatory Visit: Payer: Self-pay | Admitting: Orthopedic Surgery

## 2018-05-12 DIAGNOSIS — G8929 Other chronic pain: Secondary | ICD-10-CM

## 2018-05-12 DIAGNOSIS — M545 Low back pain: Principal | ICD-10-CM

## 2018-05-26 ENCOUNTER — Ambulatory Visit
Admission: RE | Admit: 2018-05-26 | Discharge: 2018-05-26 | Disposition: A | Discharge status: Home / Self Care | Payer: BLUE CROSS/BLUE SHIELD | Source: Ambulatory Visit | Attending: Orthopedic Surgery | Admitting: Orthopedic Surgery

## 2018-05-26 DIAGNOSIS — M545 Low back pain: Principal | ICD-10-CM

## 2018-05-26 DIAGNOSIS — G8929 Other chronic pain: Secondary | ICD-10-CM

## 2018-05-26 MED ORDER — METHYLPREDNISOLONE ACETATE 40 MG/ML INJ SUSP (RADIOLOG
120.0000 mg | Freq: Once | INTRAMUSCULAR | Status: AC
  Administered 2018-05-26: 120 mg via EPIDURAL

## 2018-05-26 MED ORDER — IOPAMIDOL (ISOVUE-M 200) INJECTION 41%
1.0000 mL | Freq: Once | INTRAMUSCULAR | Status: AC
  Administered 2018-05-26: 1 mL via EPIDURAL

## 2018-05-26 NOTE — Discharge Instructions (Signed)

## 2018-09-08 DIAGNOSIS — F329 Major depressive disorder, single episode, unspecified: Secondary | ICD-10-CM | POA: Insufficient documentation

## 2018-10-05 ENCOUNTER — Other Ambulatory Visit: Payer: Self-pay | Admitting: Orthopedic Surgery

## 2018-10-05 DIAGNOSIS — G8929 Other chronic pain: Secondary | ICD-10-CM

## 2018-10-05 DIAGNOSIS — M545 Low back pain: Principal | ICD-10-CM

## 2018-10-16 ENCOUNTER — Ambulatory Visit
Admission: RE | Admit: 2018-10-16 | Discharge: 2018-10-16 | Disposition: A | Discharge status: Home / Self Care | Payer: BLUE CROSS/BLUE SHIELD | Source: Ambulatory Visit | Attending: Orthopedic Surgery | Admitting: Orthopedic Surgery

## 2018-10-16 DIAGNOSIS — M545 Low back pain, unspecified: Secondary | ICD-10-CM

## 2018-10-16 DIAGNOSIS — G8929 Other chronic pain: Secondary | ICD-10-CM

## 2018-10-16 MED ORDER — IOPAMIDOL (ISOVUE-M 200) INJECTION 41%
1.0000 mL | Freq: Once | INTRAMUSCULAR | Status: AC
  Administered 2018-10-16: 1 mL via EPIDURAL

## 2018-10-16 MED ORDER — METHYLPREDNISOLONE ACETATE 40 MG/ML INJ SUSP (RADIOLOG
120.0000 mg | Freq: Once | INTRAMUSCULAR | Status: AC
  Administered 2018-10-16: 120 mg via EPIDURAL

## 2018-10-16 NOTE — Discharge Instructions (Signed)

## 2018-11-20 ENCOUNTER — Other Ambulatory Visit: Payer: Self-pay | Admitting: Orthopedic Surgery

## 2018-11-20 DIAGNOSIS — M545 Low back pain, unspecified: Secondary | ICD-10-CM

## 2019-01-22 ENCOUNTER — Other Ambulatory Visit: Payer: Self-pay

## 2019-01-22 ENCOUNTER — Ambulatory Visit
Admission: RE | Admit: 2019-01-22 | Discharge: 2019-01-22 | Disposition: A | Discharge status: Home / Self Care | Payer: BLUE CROSS/BLUE SHIELD | Source: Ambulatory Visit | Attending: Orthopedic Surgery | Admitting: Orthopedic Surgery

## 2019-01-22 DIAGNOSIS — M545 Low back pain, unspecified: Secondary | ICD-10-CM

## 2019-01-24 ENCOUNTER — Other Ambulatory Visit: Payer: Self-pay | Admitting: Orthopedic Surgery

## 2019-01-24 DIAGNOSIS — M545 Low back pain, unspecified: Secondary | ICD-10-CM

## 2019-01-24 DIAGNOSIS — G8929 Other chronic pain: Secondary | ICD-10-CM

## 2019-01-30 ENCOUNTER — Ambulatory Visit
Admission: RE | Admit: 2019-01-30 | Discharge: 2019-01-30 | Disposition: A | Discharge status: Home / Self Care | Payer: BLUE CROSS/BLUE SHIELD | Source: Ambulatory Visit | Attending: Orthopedic Surgery | Admitting: Orthopedic Surgery

## 2019-01-30 ENCOUNTER — Other Ambulatory Visit: Payer: Self-pay

## 2019-01-30 DIAGNOSIS — M545 Low back pain, unspecified: Secondary | ICD-10-CM

## 2019-01-30 DIAGNOSIS — G8929 Other chronic pain: Secondary | ICD-10-CM

## 2019-01-30 MED ORDER — IOPAMIDOL (ISOVUE-M 200) INJECTION 41%
1.0000 mL | Freq: Once | INTRAMUSCULAR | Status: AC
  Administered 2019-01-30: 1 mL via EPIDURAL

## 2019-01-30 MED ORDER — METHYLPREDNISOLONE ACETATE 40 MG/ML INJ SUSP (RADIOLOG
120.0000 mg | Freq: Once | INTRAMUSCULAR | Status: AC
  Administered 2019-01-30: 120 mg via EPIDURAL

## 2019-01-30 NOTE — Discharge Instructions (Signed)

## 2019-03-28 ENCOUNTER — Other Ambulatory Visit: Payer: Self-pay | Admitting: Sports Medicine

## 2019-03-28 DIAGNOSIS — M545 Low back pain, unspecified: Secondary | ICD-10-CM

## 2019-03-28 DIAGNOSIS — G8929 Other chronic pain: Secondary | ICD-10-CM

## 2019-04-04 ENCOUNTER — Other Ambulatory Visit: Payer: Self-pay

## 2019-04-04 ENCOUNTER — Ambulatory Visit
Admission: RE | Admit: 2019-04-04 | Discharge: 2019-04-04 | Disposition: A | Discharge status: Home / Self Care | Payer: BC Managed Care – PPO | Source: Ambulatory Visit | Attending: Sports Medicine | Admitting: Sports Medicine

## 2019-04-04 DIAGNOSIS — G8929 Other chronic pain: Secondary | ICD-10-CM

## 2019-04-04 DIAGNOSIS — M545 Low back pain, unspecified: Secondary | ICD-10-CM

## 2019-04-04 MED ORDER — IOPAMIDOL (ISOVUE-M 200) INJECTION 41%
1.0000 mL | Freq: Once | INTRAMUSCULAR | Status: AC
  Administered 2019-04-04: 1 mL via EPIDURAL

## 2019-04-04 MED ORDER — METHYLPREDNISOLONE ACETATE 40 MG/ML INJ SUSP (RADIOLOG
120.0000 mg | Freq: Once | INTRAMUSCULAR | Status: AC
  Administered 2019-04-04: 120 mg via EPIDURAL

## 2019-04-04 NOTE — Discharge Instructions (Signed)

## 2019-07-07 ENCOUNTER — Emergency Department (HOSPITAL_COMMUNITY): Payer: BC Managed Care – PPO

## 2019-07-07 ENCOUNTER — Other Ambulatory Visit: Payer: Self-pay

## 2019-07-07 ENCOUNTER — Inpatient Hospital Stay (HOSPITAL_COMMUNITY)
Admission: EM | Admit: 2019-07-07 | Discharge: 2019-07-26 | DRG: 356 | Disposition: A | Discharge status: Home Health | Payer: BC Managed Care – PPO | Attending: Internal Medicine | Admitting: Internal Medicine

## 2019-07-07 ENCOUNTER — Encounter (HOSPITAL_COMMUNITY): Payer: Self-pay

## 2019-07-07 DIAGNOSIS — Z841 Family history of disorders of kidney and ureter: Secondary | ICD-10-CM

## 2019-07-07 DIAGNOSIS — K651 Peritoneal abscess: Secondary | ICD-10-CM

## 2019-07-07 DIAGNOSIS — K5792 Diverticulitis of intestine, part unspecified, without perforation or abscess without bleeding: Secondary | ICD-10-CM | POA: Diagnosis present

## 2019-07-07 DIAGNOSIS — U071 COVID-19: Secondary | ICD-10-CM

## 2019-07-07 DIAGNOSIS — F329 Major depressive disorder, single episode, unspecified: Secondary | ICD-10-CM | POA: Diagnosis present

## 2019-07-07 DIAGNOSIS — Z6841 Body Mass Index (BMI) 40.0 and over, adult: Secondary | ICD-10-CM

## 2019-07-07 DIAGNOSIS — E43 Unspecified severe protein-calorie malnutrition: Secondary | ICD-10-CM | POA: Diagnosis not present

## 2019-07-07 DIAGNOSIS — Z86711 Personal history of pulmonary embolism: Secondary | ICD-10-CM

## 2019-07-07 DIAGNOSIS — K5720 Diverticulitis of large intestine with perforation and abscess without bleeding: Secondary | ICD-10-CM | POA: Diagnosis not present

## 2019-07-07 DIAGNOSIS — N179 Acute kidney failure, unspecified: Secondary | ICD-10-CM | POA: Diagnosis present

## 2019-07-07 DIAGNOSIS — A4189 Other specified sepsis: Secondary | ICD-10-CM | POA: Diagnosis not present

## 2019-07-07 DIAGNOSIS — Z7982 Long term (current) use of aspirin: Secondary | ICD-10-CM

## 2019-07-07 DIAGNOSIS — A419 Sepsis, unspecified organism: Secondary | ICD-10-CM | POA: Clinically undetermined

## 2019-07-07 DIAGNOSIS — R1084 Generalized abdominal pain: Secondary | ICD-10-CM | POA: Diagnosis not present

## 2019-07-07 DIAGNOSIS — M797 Fibromyalgia: Secondary | ICD-10-CM | POA: Diagnosis present

## 2019-07-07 DIAGNOSIS — Z20828 Contact with and (suspected) exposure to other viral communicable diseases: Secondary | ICD-10-CM | POA: Diagnosis present

## 2019-07-07 DIAGNOSIS — R001 Bradycardia, unspecified: Secondary | ICD-10-CM | POA: Diagnosis not present

## 2019-07-07 DIAGNOSIS — R6520 Severe sepsis without septic shock: Secondary | ICD-10-CM | POA: Diagnosis not present

## 2019-07-07 DIAGNOSIS — J9601 Acute respiratory failure with hypoxia: Secondary | ICD-10-CM | POA: Diagnosis not present

## 2019-07-07 DIAGNOSIS — J1289 Other viral pneumonia: Secondary | ICD-10-CM | POA: Diagnosis not present

## 2019-07-07 DIAGNOSIS — R188 Other ascites: Secondary | ICD-10-CM | POA: Diagnosis present

## 2019-07-07 DIAGNOSIS — F410 Panic disorder [episodic paroxysmal anxiety] without agoraphobia: Secondary | ICD-10-CM | POA: Diagnosis present

## 2019-07-07 DIAGNOSIS — M5136 Other intervertebral disc degeneration, lumbar region: Secondary | ICD-10-CM | POA: Diagnosis present

## 2019-07-07 DIAGNOSIS — J90 Pleural effusion, not elsewhere classified: Secondary | ICD-10-CM | POA: Diagnosis present

## 2019-07-07 DIAGNOSIS — I10 Essential (primary) hypertension: Secondary | ICD-10-CM | POA: Diagnosis present

## 2019-07-07 DIAGNOSIS — F17210 Nicotine dependence, cigarettes, uncomplicated: Secondary | ICD-10-CM | POA: Diagnosis present

## 2019-07-07 DIAGNOSIS — L02211 Cutaneous abscess of abdominal wall: Secondary | ICD-10-CM | POA: Diagnosis present

## 2019-07-07 DIAGNOSIS — R0902 Hypoxemia: Secondary | ICD-10-CM

## 2019-07-07 HISTORY — DX: Irritable bowel syndrome, unspecified: K58.9

## 2019-07-07 LAB — URINALYSIS, ROUTINE W REFLEX MICROSCOPIC
Bilirubin Urine: NEGATIVE
Glucose, UA: NEGATIVE mg/dL
Hgb urine dipstick: NEGATIVE
Ketones, ur: NEGATIVE mg/dL
Leukocytes,Ua: NEGATIVE
Nitrite: NEGATIVE
Protein, ur: 30 mg/dL — AB
Specific Gravity, Urine: 1.024 (ref 1.005–1.030)
pH: 5 (ref 5.0–8.0)

## 2019-07-07 LAB — COMPREHENSIVE METABOLIC PANEL
ALT: 22 U/L (ref 0–44)
AST: 22 U/L (ref 15–41)
Albumin: 4.1 g/dL (ref 3.5–5.0)
Alkaline Phosphatase: 73 U/L (ref 38–126)
Anion gap: 12 (ref 5–15)
BUN: 29 mg/dL — ABNORMAL HIGH (ref 6–20)
CO2: 21 mmol/L — ABNORMAL LOW (ref 22–32)
Calcium: 8.9 mg/dL (ref 8.9–10.3)
Chloride: 106 mmol/L (ref 98–111)
Creatinine, Ser: 1.26 mg/dL — ABNORMAL HIGH (ref 0.44–1.00)
GFR calc Af Amer: 58 mL/min — ABNORMAL LOW (ref >60)
GFR calc non Af Amer: 50 mL/min — ABNORMAL LOW (ref >60)
Glucose, Bld: 127 mg/dL — ABNORMAL HIGH (ref 70–99)
Potassium: 3.9 mmol/L (ref 3.5–5.1)
Sodium: 139 mmol/L (ref 135–145)
Total Bilirubin: 1.6 mg/dL — ABNORMAL HIGH (ref 0.3–1.2)
Total Protein: 7.9 g/dL (ref 6.5–8.1)

## 2019-07-07 LAB — I-STAT BETA HCG BLOOD, ED (MC, WL, AP ONLY): I-stat hCG, quantitative: <5 m[IU]/mL (ref <5)

## 2019-07-07 LAB — CBC
HCT: 48.6 % — ABNORMAL HIGH (ref 36.0–46.0)
Hemoglobin: 15 g/dL (ref 12.0–15.0)
MCH: 28.4 pg (ref 26.0–34.0)
MCHC: 30.9 g/dL (ref 30.0–36.0)
MCV: 91.9 fL (ref 80.0–100.0)
Platelets: 430 10*3/uL — ABNORMAL HIGH (ref 150–400)
RBC: 5.29 MIL/uL — ABNORMAL HIGH (ref 3.87–5.11)
RDW: 14.5 % (ref 11.5–15.5)
WBC: 22.2 10*3/uL — ABNORMAL HIGH (ref 4.0–10.5)
nRBC: 0 % (ref 0.0–0.2)

## 2019-07-07 LAB — LIPASE, BLOOD: Lipase: 17 U/L (ref 11–51)

## 2019-07-07 MED ORDER — ONDANSETRON HCL 4 MG/2ML IJ SOLN
4.0000 mg | Freq: Once | INTRAMUSCULAR | Status: AC
  Administered 2019-07-07: 21:00:00 4 mg via INTRAVENOUS
  Filled 2019-07-07: qty 2

## 2019-07-07 MED ORDER — MORPHINE SULFATE (PF) 4 MG/ML IV SOLN
6.0000 mg | Freq: Once | INTRAVENOUS | Status: AC
  Administered 2019-07-07: 6 mg via INTRAVENOUS
  Filled 2019-07-07: qty 2

## 2019-07-07 MED ORDER — MORPHINE SULFATE (PF) 4 MG/ML IV SOLN
4.0000 mg | Freq: Once | INTRAVENOUS | Status: AC
  Administered 2019-07-07: 23:00:00 4 mg via INTRAVENOUS
  Filled 2019-07-07: qty 1

## 2019-07-07 MED ORDER — SODIUM CHLORIDE 0.9 % IV BOLUS
1000.0000 mL | Freq: Once | INTRAVENOUS | Status: AC
  Administered 2019-07-07: 1000 mL via INTRAVENOUS

## 2019-07-07 MED ORDER — SODIUM CHLORIDE (PF) 0.9 % IJ SOLN
INTRAMUSCULAR | Status: AC
  Administered 2019-07-07: 23:00:00
  Filled 2019-07-07: qty 50

## 2019-07-07 MED ORDER — SODIUM CHLORIDE 0.9% FLUSH
3.0000 mL | Freq: Once | INTRAVENOUS | Status: AC
  Administered 2019-07-07: 21:00:00 3 mL via INTRAVENOUS

## 2019-07-07 MED ORDER — PIPERACILLIN-TAZOBACTAM 3.375 G IVPB 30 MIN
3.3750 g | Freq: Once | INTRAVENOUS | Status: AC
  Administered 2019-07-07: 3.375 g via INTRAVENOUS
  Filled 2019-07-07: qty 50

## 2019-07-07 MED ORDER — IOHEXOL 300 MG/ML  SOLN
100.0000 mL | Freq: Once | INTRAMUSCULAR | Status: AC | PRN
  Administered 2019-07-07: 100 mL via INTRAVENOUS

## 2019-07-07 NOTE — ED Provider Notes (Signed)
[facility]-EMERGENCY DEPT Provider Note   CSN: [id number] Arrival date & time: 07/07/19  1834     History   Chief Complaint Chief Complaint  Patient presents with   Abdominal Pain   Near Syncope   Fever   Diarrhea    HPI Holly Moon is a 48 y.o. female.     The history is provided by the patient. No language interpreter was used.  Abdominal Pain Associated symptoms: diarrhea and fever   Near Syncope Associated symptoms include abdominal pain.  Fever Associated symptoms: diarrhea   Diarrhea Associated symptoms: abdominal pain and fever      48 year old female recently exposed to someone with COVID-19 approximately a week ago presenting for evaluation of abdominal pain.  Patient reports since last night she developed abdominal pain.  She described pain as a crampy sharp sensation, waxing waning, spread throughout her abdomen with associated nausea, nonbloody nonbilious vomiting, and normal bowel movement.  She also endorsed low-grade fever and decrease in appetite.  Pain is moderate in severity.  She denies any associated chest pain shortness of breath or productive cough no loss of taste or smell and no dysuria.  She mention her sister recently diagnosed with COVID-19.  She was last seen and spent time with her sister approximately 1 week ago for lunch.  Her sister was diagnosed with COVID-19 2 days ago.  Patient report history of IBS and states that she has been trying to lose weight by monitoring her diet however she did eat pizza yesterday.  She still has an intact appendix.  Past Medical History:  Diagnosis Date   Hypertension 2015   IBS (irritable bowel syndrome)    Pulmonary embolism [facility])     Patient Active Problem List   Diagnosis Date Noted   History of carpal tunnel release 02/17/2018   History of anxiety 02/17/2018   History of hypertension 02/17/2018   DDD (degenerative disc disease), lumbar 02/17/2018   History of  pulmonary embolism 02/17/2018    Past Surgical History:  Procedure Laterality Date   FRACTURE SURGERY Left 1987   leg fracture   RIGHT CARPAL     WRIST FRACTURE SURGERY       OB History   No obstetric history on file.      Home Medications    Prior to Admission medications   Medication Sig Start Date End Date Taking? Authorizing Provider  aspirin EC 325 MG tablet Take 325 mg by mouth daily as needed (headache).    [provider]  aspirin EC 81 MG tablet Take 81 mg by mouth daily.    [provider]  benazepril (LOTENSIN) 20 MG tablet Take 20 mg by mouth daily.    [provider]  ciprofloxacin (CIPRO) 500 MG tablet Take 1 tablet (500 mg total) by mouth 2 (two) times daily. Patient not taking: Reported on 02/17/2018 11/07/16   Elpidio Anis, PA-C  gabapentin (NEURONTIN) 300 MG capsule  01/25/18   [provider]  HYDROcodone-acetaminophen (NORCO/VICODIN) 5-325 MG tablet Take 1 tablet by mouth every 4 (four) hours as needed. Patient not taking: Reported on 02/17/2018 11/07/16   Elpidio Anis, PA-C  metroNIDAZOLE (FLAGYL) 500 MG tablet Take 1 tablet (500 mg total) by mouth 3 (three) times daily. Patient not taking: Reported on 02/17/2018 11/07/16   Elpidio Anis, PA-C  naproxen (NAPROSYN) 500 MG tablet  02/04/18   [provider]  tiZANidine (ZANAFLEX) 4 MG tablet  02/04/18   [provider]  Family History Family History  Problem Relation Age of Onset   Kidney failure Father     Social History Social History   Tobacco Use   Smoking status: Current Every Day Smoker    Packs/day: 0.05   Smokeless tobacco: Never Used  Substance Use Topics   Alcohol use: Yes    Comment: very rarely    Drug use: No     Allergies   Patient has no known allergies.   Review of Systems Review of Systems  Constitutional: Positive for fever.  Cardiovascular: Positive for near-syncope.  Gastrointestinal: Positive for abdominal  pain and diarrhea.  All other systems reviewed and are negative.    Physical Exam Updated Vital Signs BP (!) 158/87 (BP Location: Left Arm)    Pulse (!) 108    Temp 99.2 F (37.3 C) (Oral)    Resp 18    Ht  (1.651 m)    Wt 133 kg    LMP 05/27/2019    SpO2 98%    BMI 48.79 kg/m   Physical Exam Vitals signs and nursing note reviewed.  Constitutional:      General: She is not in acute distress.    Appearance: She is well-developed. She is obese.  HENT:     Head: Atraumatic.  Eyes:     Conjunctiva/sclera: Conjunctivae normal.  Neck:     Musculoskeletal: Neck supple.  Cardiovascular:     Rate and Rhythm: Normal rate and regular rhythm.     Heart sounds: Normal heart sounds.  Pulmonary:     Effort: Pulmonary effort is normal.     Breath sounds: Normal breath sounds.  Abdominal:     General: Abdomen is flat. Bowel sounds are normal.     Palpations: Abdomen is soft.     Tenderness: There is abdominal tenderness (Abdomen is diffusely tender without focal point tenderness no guarding or rebound tenderness.).  Skin:    Findings: No rash.  Neurological:     Mental Status: She is alert.      ED Treatments / Results  Labs (all labs ordered are listed, but only abnormal results are displayed) Labs Reviewed  COMPREHENSIVE METABOLIC PANEL - Abnormal; Notable for the following components:      Result Value   CO2 21 (*)    Glucose, Bld 127 (*)    BUN 29 (*)    Creatinine, Ser 1.26 (*)    Total Bilirubin 1.6 (*)    GFR calc non Af Amer 50 (*)    GFR calc Af Amer 58 (*)    All other components within normal limits  CBC - Abnormal; Notable for the following components:   WBC 22.2 (*)    RBC 5.29 (*)    HCT 48.6 (*)    Platelets 430 (*)    All other components within normal limits  URINALYSIS, ROUTINE W REFLEX MICROSCOPIC - Abnormal; Notable for the following components:   Color, Urine AMBER (*)    APPearance CLOUDY (*)    Protein, ur 30 (*)    Bacteria, UA RARE (*)     All other components within normal limits  SARS CORONAVIRUS 2 (TAT 6-24 HRS)  LIPASE, BLOOD  I-STAT BETA HCG BLOOD, ED ([facility], [facility], [facility] ONLY)    EKG None  Radiology Ct Abdomen Pelvis W Contrast  Result Date: 07/07/2019 CLINICAL DATA:  Abdominal pain diarrhea EXAM: CT ABDOMEN AND PELVIS WITH CONTRAST TECHNIQUE: Multidetector CT imaging of the abdomen and pelvis was performed using the standard  protocol following bolus administration of intravenous contrast. CONTRAST:  100mL OMNIPAQUE IOHEXOL 300 MG/ML  SOLN COMPARISON:  None. FINDINGS: Lower chest: Lung bases demonstrate no acute consolidation or effusion. The heart size is within normal limits. Hepatobiliary: No focal hepatic abnormality or biliary dilatation. Increased intraluminal density within the gallbladder. Pancreas: Unremarkable. No pancreatic ductal dilatation or surrounding inflammatory changes. Spleen: Normal in size without focal abnormality. Adrenals/Urinary Tract: Adrenal glands are unremarkable. Kidneys are normal, without renal calculi, focal lesion, or hydronephrosis. Bladder is unremarkable. Stomach/Bowel: The stomach is nonenlarged. No dilated small bowel. Nondilated appendix. Hyperdense focus at the cecum near the origin of the appendix, possible diverticulum versus small appendicoliths but no evidence for appendiceal inflammatory change. Fluid within the colon. Sigmoid colon diverticular disease with wall thickening and inflammatory change consistent with acute diverticulitis. Multiple foci of extraluminal gas at the inflamed sigmoid colon. Vascular/Lymphatic: Nonaneurysmal aorta. No significantly enlarged lymph nodes. Reproductive: Uterus and bilateral adnexa are unremarkable. Other: Small foci of extraluminal gas within the right lower quadrant adjacent to the inflamed sigmoid colon. Tiny foci of gas within the right upper quadrant and overlying the dome of the liver. Musculoskeletal: No acute or suspicious osseous abnormality. There  are degenerative changes. IMPRESSION: 1. Focal inflammatory changes and wall thickening involving the distal sigmoid colon consistent with acute diverticulitis. Multiple small foci of gas adjacent to the inflamed sigmoid colon with small foci of free air adjacent to the liver, consistent with perforation. Negative for organized abscess at this time. 2. Increased intraluminal density within the gallbladder, possible stones or sludge. Critical Value/emergent results were called by telephone at the time of interpretation on 07/07/2019 at 11:12 pm to providerBOWIE Armanii Pressnell , who verbally acknowledged these results. Electronically Signed   By: Jasmine PangKim  Fujinaga M.D.   On: 07/07/2019 23:12   Dg Chest Portable 1 View  Result Date: 07/07/2019 CLINICAL DATA:  Fever, recent COVID exposure. EXAM: PORTABLE CHEST 1 VIEW COMPARISON:  04/24/2013 FINDINGS: The heart size and mediastinal contours are within normal limits. Both lungs are clear. The visualized skeletal structures are unremarkable. IMPRESSION: No active disease. Electronically Signed   By: Charlett NoseKevin  Dover M.D.   On: 07/07/2019 19:45    Procedures Procedures (including critical care time)  Medications Ordered in ED Medications  sodium chloride (PF) 0.9 % injection (has no administration in time range)  sodium chloride 0.9 % bolus 1,000 mL (has no administration in time range)  piperacillin-tazobactam (ZOSYN) IVPB 3.375 g (has no administration in time range)  morphine 4 MG/ML injection 4 mg (has no administration in time range)  sodium chloride flush (NS) 0.9 % injection 3 mL (3 mLs Intravenous Given 07/07/19 2110)  morphine 4 MG/ML injection 6 mg (6 mg Intravenous Given 07/07/19 2108)  ondansetron (ZOFRAN) injection 4 mg (4 mg Intravenous Given 07/07/19 2107)  sodium chloride 0.9 % bolus 1,000 mL (1,000 mLs Intravenous New Bag/Given 07/07/19 2106)  iohexol (OMNIPAQUE) 300 MG/ML solution 100 mL (100 mLs Intravenous Contrast Given 07/07/19 2243)     Initial  Impression / Assessment and Plan / ED Course  I have reviewed the triage vital signs and the nursing notes.  Pertinent labs & imaging results that were available during my care of the patient were reviewed by me and considered in my medical decision making (see chart for details).        BP 103/62    Pulse 89    Temp 98.4 F (36.9 C) (Oral)    Resp 17    Ht 5'  5" (1.651 m)    Wt 133 kg    LMP 05/27/2019    SpO2 98%    BMI 48.79 kg/m    Final Clinical Impressions(s) / ED Diagnoses   Final diagnoses:  Perforation of sigmoid colon due to diverticulitis    ED Discharge Orders    None     7:46 PM Patient here with abdominal pain nausea and vomiting reported having fever.  She has diffuse abdominal pain on exam.  Work-up initiated.  Abdominal pelvic CT scan ordered.  11:15 PM Labs remarkable for an elevated white count of 22.2.  Mild AKI with a creatinine of 1.26.  Abdominal pelvis CT scan obtained showing evidence of acute diverticulitis involving the distal sigmoid colon with evidence of perforation but no abscess noted.  Patient given pain medication, IV fluid, antibiotic including Zosyn.  Will consult general surgery for further management.  COVID-19 testing has been ordered.  Holly Moon was evaluated in Emergency Department on 07/07/2019 for the symptoms described in the history of present illness. She was evaluated in the context of the global COVID-19 pandemic, which necessitated consideration that the patient might be at risk for infection with the SARS-CoV-2 virus that causes COVID-19. Institutional protocols and algorithms that pertain to the evaluation of patients at risk for COVID-19 are in a state of rapid change based on information released by regulatory bodies including the CDC and federal and state organizations. These policies and algorithms were followed during the patient's care in the ED.  11:33 PM Appreciate consultation from General Surgery Dr. Dema Severin who  agrees to see and admit pt for further care.  Pt is aware of plan.    Domenic Moras, PA-C 07/07/19 2334    Virgel Manifold, MD 07/11/19 [phone]

## 2019-07-07 NOTE — ED Triage Notes (Signed)
Pt endorses generalized stomach pain x 1 day. Increase pain today. Diarrhea started last night. Multiple episodes last night however none today. Fever 100.2. Dizziness with pain only. Exposure to COVID 1 week ago. Has not gotten tested. Denies any other symptoms.

## 2019-07-08 ENCOUNTER — Other Ambulatory Visit: Payer: Self-pay

## 2019-07-08 DIAGNOSIS — K5792 Diverticulitis of intestine, part unspecified, without perforation or abscess without bleeding: Secondary | ICD-10-CM | POA: Diagnosis not present

## 2019-07-08 DIAGNOSIS — Z6841 Body Mass Index (BMI) 40.0 and over, adult: Secondary | ICD-10-CM | POA: Diagnosis not present

## 2019-07-08 DIAGNOSIS — L02211 Cutaneous abscess of abdominal wall: Secondary | ICD-10-CM | POA: Diagnosis present

## 2019-07-08 DIAGNOSIS — J9601 Acute respiratory failure with hypoxia: Secondary | ICD-10-CM | POA: Diagnosis not present

## 2019-07-08 DIAGNOSIS — Z86711 Personal history of pulmonary embolism: Secondary | ICD-10-CM | POA: Diagnosis not present

## 2019-07-08 DIAGNOSIS — R6520 Severe sepsis without septic shock: Secondary | ICD-10-CM | POA: Diagnosis not present

## 2019-07-08 DIAGNOSIS — Z20828 Contact with and (suspected) exposure to other viral communicable diseases: Secondary | ICD-10-CM | POA: Diagnosis not present

## 2019-07-08 DIAGNOSIS — R001 Bradycardia, unspecified: Secondary | ICD-10-CM | POA: Diagnosis not present

## 2019-07-08 DIAGNOSIS — M797 Fibromyalgia: Secondary | ICD-10-CM | POA: Diagnosis present

## 2019-07-08 DIAGNOSIS — A4189 Other specified sepsis: Secondary | ICD-10-CM | POA: Diagnosis not present

## 2019-07-08 DIAGNOSIS — K5720 Diverticulitis of large intestine with perforation and abscess without bleeding: Secondary | ICD-10-CM | POA: Diagnosis not present

## 2019-07-08 DIAGNOSIS — Z7982 Long term (current) use of aspirin: Secondary | ICD-10-CM | POA: Diagnosis not present

## 2019-07-08 DIAGNOSIS — F410 Panic disorder [episodic paroxysmal anxiety] without agoraphobia: Secondary | ICD-10-CM | POA: Diagnosis present

## 2019-07-08 DIAGNOSIS — N179 Acute kidney failure, unspecified: Secondary | ICD-10-CM | POA: Diagnosis not present

## 2019-07-08 DIAGNOSIS — U071 COVID-19: Secondary | ICD-10-CM | POA: Diagnosis not present

## 2019-07-08 DIAGNOSIS — F17210 Nicotine dependence, cigarettes, uncomplicated: Secondary | ICD-10-CM | POA: Diagnosis present

## 2019-07-08 DIAGNOSIS — M5136 Other intervertebral disc degeneration, lumbar region: Secondary | ICD-10-CM | POA: Diagnosis present

## 2019-07-08 DIAGNOSIS — J1289 Other viral pneumonia: Secondary | ICD-10-CM | POA: Diagnosis not present

## 2019-07-08 DIAGNOSIS — K651 Peritoneal abscess: Secondary | ICD-10-CM | POA: Diagnosis not present

## 2019-07-08 DIAGNOSIS — J069 Acute upper respiratory infection, unspecified: Secondary | ICD-10-CM | POA: Diagnosis not present

## 2019-07-08 DIAGNOSIS — F329 Major depressive disorder, single episode, unspecified: Secondary | ICD-10-CM | POA: Diagnosis present

## 2019-07-08 DIAGNOSIS — J90 Pleural effusion, not elsewhere classified: Secondary | ICD-10-CM | POA: Diagnosis not present

## 2019-07-08 DIAGNOSIS — I10 Essential (primary) hypertension: Secondary | ICD-10-CM | POA: Diagnosis present

## 2019-07-08 DIAGNOSIS — R1084 Generalized abdominal pain: Secondary | ICD-10-CM | POA: Diagnosis present

## 2019-07-08 DIAGNOSIS — R0902 Hypoxemia: Secondary | ICD-10-CM | POA: Diagnosis not present

## 2019-07-08 DIAGNOSIS — R188 Other ascites: Secondary | ICD-10-CM | POA: Diagnosis not present

## 2019-07-08 DIAGNOSIS — E43 Unspecified severe protein-calorie malnutrition: Secondary | ICD-10-CM | POA: Diagnosis not present

## 2019-07-08 DIAGNOSIS — J189 Pneumonia, unspecified organism: Secondary | ICD-10-CM | POA: Diagnosis not present

## 2019-07-08 DIAGNOSIS — Z841 Family history of disorders of kidney and ureter: Secondary | ICD-10-CM | POA: Diagnosis not present

## 2019-07-08 LAB — BASIC METABOLIC PANEL
Anion gap: 9 (ref 5–15)
BUN: 28 mg/dL — ABNORMAL HIGH (ref 6–20)
CO2: 19 mmol/L — ABNORMAL LOW (ref 22–32)
Calcium: 8.2 mg/dL — ABNORMAL LOW (ref 8.9–10.3)
Chloride: 109 mmol/L (ref 98–111)
Creatinine, Ser: 1.19 mg/dL — ABNORMAL HIGH (ref 0.44–1.00)
GFR calc Af Amer: >60 mL/min (ref >60)
GFR calc non Af Amer: 54 mL/min — ABNORMAL LOW (ref >60)
Glucose, Bld: 134 mg/dL — ABNORMAL HIGH (ref 70–99)
Potassium: 3.8 mmol/L (ref 3.5–5.1)
Sodium: 137 mmol/L (ref 135–145)

## 2019-07-08 LAB — CBC WITH DIFFERENTIAL/PLATELET
Abs Immature Granulocytes: 0.25 10*3/uL — ABNORMAL HIGH (ref 0.00–0.07)
Basophils Absolute: 0.1 10*3/uL (ref 0.0–0.1)
Basophils Relative: 0 %
Eosinophils Absolute: 0 10*3/uL (ref 0.0–0.5)
Eosinophils Relative: 0 %
HCT: 41.2 % (ref 36.0–46.0)
Hemoglobin: 12.7 g/dL (ref 12.0–15.0)
Immature Granulocytes: 2 %
Lymphocytes Relative: 7 %
Lymphs Abs: 1.1 10*3/uL (ref 0.7–4.0)
MCH: 28.4 pg (ref 26.0–34.0)
MCHC: 30.8 g/dL (ref 30.0–36.0)
MCV: 92.2 fL (ref 80.0–100.0)
Monocytes Absolute: 0.4 10*3/uL (ref 0.1–1.0)
Monocytes Relative: 3 %
Neutro Abs: 14.3 10*3/uL — ABNORMAL HIGH (ref 1.7–7.7)
Neutrophils Relative %: 88 %
Platelets: 372 10*3/uL (ref 150–400)
RBC: 4.47 MIL/uL (ref 3.87–5.11)
RDW: 14.6 % (ref 11.5–15.5)
WBC: 16.2 10*3/uL — ABNORMAL HIGH (ref 4.0–10.5)
nRBC: 0 % (ref 0.0–0.2)

## 2019-07-08 LAB — SARS CORONAVIRUS 2 (TAT 6-24 HRS): SARS Coronavirus 2: NEGATIVE

## 2019-07-08 LAB — HIV ANTIBODY (ROUTINE TESTING W REFLEX): HIV Screen 4th Generation wRfx: NONREACTIVE

## 2019-07-08 MED ORDER — GABAPENTIN 300 MG PO CAPS: 300.0000 mg | ORAL_CAPSULE | Freq: Three times a day (TID) | ORAL | Status: DC

## 2019-07-08 MED ORDER — DESVENLAFAXINE SUCCINATE ER 50 MG PO TB24
50.0000 mg | ORAL_TABLET | Freq: Every day | ORAL | Status: DC
  Administered 2019-07-08 – 2019-07-26 (×18): 50 mg via ORAL
  Filled 2019-07-08 (×20): qty 1

## 2019-07-08 MED ORDER — BUSPIRONE HCL 10 MG PO TABS
30.0000 mg | ORAL_TABLET | Freq: Two times a day (BID) | ORAL | Status: DC
  Administered 2019-07-08 – 2019-07-26 (×37): 30 mg via ORAL
  Filled 2019-07-08 (×4): qty 3
  Filled 2019-07-08: qty 6
  Filled 2019-07-08 (×11): qty 3
  Filled 2019-07-08 (×2): qty 6
  Filled 2019-07-08 (×20): qty 3

## 2019-07-08 MED ORDER — ONDANSETRON HCL 4 MG/2ML IJ SOLN
4.0000 mg | Freq: Four times a day (QID) | INTRAMUSCULAR | Status: DC | PRN
  Administered 2019-07-09 – 2019-07-25 (×31): 4 mg via INTRAVENOUS
  Filled 2019-07-08 (×34): qty 2

## 2019-07-08 MED ORDER — ALPRAZOLAM 0.5 MG PO TABS
0.5000 mg | ORAL_TABLET | Freq: Two times a day (BID) | ORAL | Status: DC | PRN
  Administered 2019-07-08 – 2019-07-20 (×9): 0.5 mg via ORAL
  Filled 2019-07-08 (×8): qty 1

## 2019-07-08 MED ORDER — HEPARIN SODIUM (PORCINE) 5000 UNIT/ML IJ SOLN
5000.0000 [IU] | Freq: Three times a day (TID) | INTRAMUSCULAR | Status: DC
  Administered 2019-07-08 – 2019-07-09 (×4): 5000 [IU] via SUBCUTANEOUS
  Filled 2019-07-08 (×4): qty 1

## 2019-07-08 MED ORDER — PNEUMOCOCCAL VAC POLYVALENT 25 MCG/0.5ML IJ INJ
0.5000 mL | INJECTION | INTRAMUSCULAR | Status: DC
  Filled 2019-07-08: qty 0.5

## 2019-07-08 MED ORDER — DESVENLAFAXINE SUCCINATE ER 50 MG PO TB24: 50.0000 mg | ORAL_TABLET | Freq: Every day | ORAL | Status: DC

## 2019-07-08 MED ORDER — PIPERACILLIN-TAZOBACTAM 3.375 G IVPB
3.3750 g | Freq: Three times a day (TID) | INTRAVENOUS | Status: DC
  Administered 2019-07-08 – 2019-07-24 (×49): 3.375 g via INTRAVENOUS
  Filled 2019-07-08 (×47): qty 50

## 2019-07-08 MED ORDER — SIMETHICONE 80 MG PO CHEW: 40.0000 mg | CHEWABLE_TABLET | Freq: Four times a day (QID) | ORAL | Status: DC | PRN

## 2019-07-08 MED ORDER — TRAMADOL HCL 50 MG PO TABS
50.0000 mg | ORAL_TABLET | Freq: Four times a day (QID) | ORAL | Status: DC | PRN
  Administered 2019-07-08 – 2019-07-11 (×5): 50 mg via ORAL
  Filled 2019-07-08 (×6): qty 1

## 2019-07-08 MED ORDER — GABAPENTIN 300 MG PO CAPS
300.0000 mg | ORAL_CAPSULE | Freq: Three times a day (TID) | ORAL | Status: DC
  Administered 2019-07-08 – 2019-07-23 (×44): 300 mg via ORAL
  Filled 2019-07-08 (×3): qty 1
  Filled 2019-07-08: qty 3
  Filled 2019-07-08 (×42): qty 1

## 2019-07-08 MED ORDER — ONDANSETRON 4 MG PO TBDP
4.0000 mg | ORAL_TABLET | Freq: Four times a day (QID) | ORAL | Status: DC | PRN
  Administered 2019-07-11 – 2019-07-26 (×4): 4 mg via ORAL
  Filled 2019-07-08 (×5): qty 1

## 2019-07-08 MED ORDER — ACETAMINOPHEN 500 MG PO TABS
1000.0000 mg | ORAL_TABLET | Freq: Four times a day (QID) | ORAL | Status: DC
  Administered 2019-07-08 – 2019-07-26 (×62): 1000 mg via ORAL
  Filled 2019-07-08 (×71): qty 2

## 2019-07-08 MED ORDER — DIPHENHYDRAMINE HCL 50 MG/ML IJ SOLN
12.5000 mg | Freq: Four times a day (QID) | INTRAMUSCULAR | Status: DC | PRN
  Administered 2019-07-25: 12.5 mg via INTRAVENOUS
  Filled 2019-07-08 (×2): qty 1

## 2019-07-08 MED ORDER — DIPHENHYDRAMINE HCL 12.5 MG/5ML PO ELIX
12.5000 mg | ORAL_SOLUTION | Freq: Four times a day (QID) | ORAL | Status: DC | PRN
  Administered 2019-07-24 (×3): 12.5 mg via ORAL
  Filled 2019-07-08 (×3): qty 5

## 2019-07-08 MED ORDER — HYDROMORPHONE HCL 1 MG/ML IJ SOLN
0.5000 mg | INTRAMUSCULAR | Status: DC | PRN
  Administered 2019-07-08 – 2019-07-11 (×8): 0.5 mg via INTRAVENOUS
  Filled 2019-07-08 (×9): qty 0.5

## 2019-07-08 MED ORDER — TIZANIDINE HCL 4 MG PO TABS
4.0000 mg | ORAL_TABLET | Freq: Every day | ORAL | Status: DC
  Administered 2019-07-08 – 2019-07-20 (×14): 4 mg via ORAL
  Filled 2019-07-08 (×14): qty 1

## 2019-07-08 MED ORDER — TOPIRAMATE 100 MG PO TABS
100.0000 mg | ORAL_TABLET | Freq: Every day | ORAL | Status: DC
  Administered 2019-07-08 – 2019-07-26 (×18): 100 mg via ORAL
  Filled 2019-07-08 (×18): qty 1

## 2019-07-08 MED ORDER — LACTATED RINGERS IV SOLN
INTRAVENOUS | Status: DC
  Administered 2019-07-08 (×2): via INTRAVENOUS

## 2019-07-08 MED ORDER — BENAZEPRIL HCL 20 MG PO TABS
20.0000 mg | ORAL_TABLET | Freq: Every day | ORAL | Status: DC
  Administered 2019-07-10 – 2019-07-26 (×16): 20 mg via ORAL
  Filled 2019-07-08 (×19): qty 1
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
## 2019-07-08 NOTE — Progress Notes (Signed)
Have paged Dr. Redmond Pulling and received returned call. Have reported pt's low BP. MD states to recheck BP in one hour. If still systolic in 76'B give one liter NS bolus. Will continue to monitor and treat as needed. Pt alert and without c/o at this time.

## 2019-07-08 NOTE — Progress Notes (Signed)
Patient ID: Holly Moon, female   DOB: 12/28/1970, 48 y.o.   MRN: [id number]   Acute Care Surgery Service Progress Note:    Chief Complaint/Subjective: Couldn't get comfortable last pm - chronic back issues, fibro But less generalized abd pain, now mainly in lower abdomen prob not as intense  Objective: Vital signs in last 24 hours: Temp:  [98.3 F (36.8 C)-99.2 F (37.3 C)] 98.3 F (36.8 C) (11/15 0538) Pulse Rate:  [66-108] 66 (11/15 0538) Resp:  [16-32] 18 (11/15 0538) BP: (99-158)/(54-128) 139/96 (11/15 0538) SpO2:  [92 %-99 %] 95 % (11/15 0538) Weight:  [133 kg] 133 kg (11/14 1900) Last BM Date: 07/06/19  Intake/Output from previous day: 11/14 0701 - 11/15 0700 In: 1562.8 [I.V.:462.8; IV Piggyback:1100] Out: -  Intake/Output this shift: No intake/output data recorded.  Lungs:  nonlabored  Cardiovascular: reg  Abd: obese, soft, TTP in lower abd - suprapubic, no peritonitis  Extremities: no edema, +SCDs  Neuro: alert, nonfocal  Gen: nontoxic, severe obesity  Lab Results: CBC  Recent Labs    07/07/19 2057 07/08/19 0532  WBC 22.2* 16.2*  HGB 15.0 12.7  HCT 48.6* 41.2  PLT 430* 372   BMET Recent Labs    07/07/19 2057 07/08/19 0532  NA 139 137  K 3.9 3.8  CL 106 109  CO2 21* 19*  GLUCOSE 127* 134*  BUN 29* 28*  CREATININE 1.26* 1.19*  CALCIUM 8.9 8.2*   LFT Hepatic Function Latest Ref Rng & Units 07/07/2019 11/07/2016 04/05/2015  Total Protein 6.5 - 8.1 g/dL 7.9 7.6 7.9  Albumin 3.5 - 5.0 g/dL 4.1 4.0 3.9  AST 15 - 41 U/L 22 15 19  ALT 0 - 44 U/L 22 15 14  Alk Phosphatase 38 - 126 U/L 73 54 75  Total Bilirubin 0.3 - 1.2 mg/dL 1.6(H) 0.6 0.7   PT/INR No results for input(s): LABPROT, INR in the last 72 hours. ABG No results for input(s): PHART, HCO3 in the last 72 hours.  Invalid input(s): PCO2, PO2  Studies/Results:  Anti-infectives: Anti-infectives (From admission, onward)   Start     Dose/Rate Route Frequency Ordered Stop   07/08/19 0600  piperacillin-tazobactam (ZOSYN) IVPB 3.375 g     3.375 g 12.5 mL/hr over 240 Minutes Intravenous Every 8 hours 07/08/19 0129     07/07/19 2315  piperacillin-tazobactam (ZOSYN) IVPB 3.375 g     3.375 g 100 mL/hr over 30 Minutes Intravenous  Once 07/07/19 2314 07/07/19 2356      Medications: Scheduled Meds: . acetaminophen  1,000 mg Oral Q6H  . benazepril  20 mg Oral Daily  . busPIRone  30 mg Oral BID  . desvenlafaxine  50 mg Oral Daily  . gabapentin  300 mg Oral TID  . heparin  5,000 Units Subcutaneous Q8H  . [START ON 07/09/2019] pneumococcal 23 valent vaccine  0.5 mL Intramuscular Tomorrow-1000  . tiZANidine  4 mg Oral QHS  . topiramate  100 mg Oral Daily   Continuous Infusions: . lactated ringers 125 mL/hr at 07/08/19 0600  . piperacillin-tazobactam (ZOSYN)  IV 3.375 g (07/08/19 0603)   PRN Meds:.ALPRAZolam, diphenhydrAMINE **OR** diphenhydrAMINE, HYDROmorphone (DILAUDID) injection, ondansetron **OR** ondansetron (ZOFRAN) IV, simethicone, traMADol  Assessment/Plan: Patient Active Problem List   Diagnosis Date Noted  . Diverticulitis 07/08/2019  . History of carpal tunnel release 02/17/2018  . History of anxiety 02/17/2018  . History of hypertension 02/17/2018  . DDD (degenerative disc disease), lumbar 02/17/2018  . History of pulmonary embolism 02/17/2018     Severe obesity Tobacco use Sigmoid diverticulitis with microperf H/o PE HTN IBS Chronic back pain  No fever, vitals improved, improved pain but still TTP, wbc better Will allow Clears Cont iv abx rediscussed diverticular dis and mgmt with pt; if this episode resolves, will need outpt GI consult to discuss colonoscopy OOB, in chair, ambulate VTE prophy - scds, subcu heparin  Disposition: clears, encouraged pt to take slow, ambulate, oob  LOS: 0 days    Leighton Ruff. Redmond Pulling, MD, FACS General, Bariatric, & Minimally Invasive Surgery [phone] [facility]

## 2019-07-09 MED ORDER — METHOCARBAMOL 500 MG PO TABS
500.0000 mg | ORAL_TABLET | Freq: Four times a day (QID) | ORAL | Status: DC | PRN
  Administered 2019-07-09 – 2019-07-15 (×4): 500 mg via ORAL
  Filled 2019-07-09 (×4): qty 1

## 2019-07-09 MED ORDER — ENOXAPARIN SODIUM 60 MG/0.6ML ~~LOC~~ SOLN
60.0000 mg | SUBCUTANEOUS | Status: DC
  Administered 2019-07-09 – 2019-07-16 (×8): 60 mg via SUBCUTANEOUS
  Filled 2019-07-09 (×8): qty 0.6

## 2019-07-09 MED ORDER — SODIUM CHLORIDE 0.9 % IV SOLN
INTRAVENOUS | Status: DC
  Administered 2019-07-09 – 2019-07-13 (×6): via INTRAVENOUS

## 2019-07-09 NOTE — Progress Notes (Signed)
CC: Abdominal pain she thought it was a recurrent UTI  Subjective: Continues to complain of abdominal pain she complains of it rather diffusely in the epigastric area and in both left and right lower quadrants.  I cannot sense that one area is more tender than another.  She also has fibromyalgia which she says is all over. Objective: Vital signs in last 24 hours: Temp:  [97.8 F (36.6 C)-98.4 F (36.9 C)] 98.4 F (36.9 C) (11/16 0521) Pulse Rate:  [62-84] 78 (11/16 0521) Resp:  [16-18] 16 (11/16 0521) BP: (81-123)/(42-75) 105/58 (11/16 0521) SpO2:  [95 %-98 %] 96 % (11/16 0521) Last BM Date: 07/06/19 240 p.o. 2100 IV Urine x1 recorded No BM recorded Afebrile vital signs are stable she was a little hypotensive early a.m. yesterday that somewhat better today. Creatinine improved at 1.19 WBC 16.2 H/H 12.7/41.2. Intake/Output from previous day: 11/15 0701 - 11/16 0700 In: 2313.7 [P.O.:240; I.V.:2023.7; IV Piggyback:50] Out: -  Intake/Output this shift: No intake/output data recorded.  General appearance: alert, cooperative and no distress Resp: clear to auscultation bilaterally GI: Soft, she complains of pain in the epigastric area, the right lower quadrant and the left lower quadrant.  She actually complains more of the right lower quadrant in the left lower quadrant.  Lab Results:  Recent Labs    07/07/19 2057 07/08/19 0532  WBC 22.2* 16.2*  HGB 15.0 12.7  HCT 48.6* 41.2  PLT 430* 372    BMET Recent Labs    07/07/19 2057 07/08/19 0532  NA 139 137  K 3.9 3.8  CL 106 109  CO2 21* 19*  GLUCOSE 127* 134*  BUN 29* 28*  CREATININE 1.26* 1.19*  CALCIUM 8.9 8.2*   PT/INR No results for input(s): LABPROT, INR in the last 72 hours.  Recent Labs  Lab 07/07/19 2057  AST 22  ALT 22  ALKPHOS 73  BILITOT 1.6*  PROT 7.9  ALBUMIN 4.1     Lipase     Component Value Date/Time   LIPASE 17 07/07/2019 2057   Prior to Admission medications   Medication  Sig Start Date End Date Taking? Authorizing Provider  ALPRAZolam Prudy Feeler) 0.5 MG tablet Take 0.5 mg by mouth 2 (two) times daily as needed. 06/30/19  Yes [provider]  aspirin EC 81 MG tablet Take 81 mg by mouth daily.   Yes [provider]  benazepril (LOTENSIN) 20 MG tablet Take 20 mg by mouth daily.   Yes [provider]  busPIRone (BUSPAR) 30 MG tablet Take 1 tablet by mouth 2 (two) times daily. 06/29/19  Yes [provider]  desvenlafaxine (PRISTIQ) 50 MG 24 hr tablet Take 50 mg by mouth daily. 07/04/19  Yes [provider]  gabapentin (NEURONTIN) 300 MG capsule  01/25/18  Yes [provider]  naproxen (NAPROSYN) 500 MG tablet  02/04/18  Yes [provider]  tiZANidine (ZANAFLEX) 4 MG tablet  02/04/18  Yes [provider]  topiramate (TOPAMAX) 100 MG tablet Take 1 tablet by mouth daily. 06/29/19  Yes [provider]  venlafaxine XR (EFFEXOR-XR) 150 MG 24 hr capsule Take 1 capsule by mouth daily. 06/29/19  Yes [provider]  aspirin EC 325 MG tablet Take 325 mg by mouth daily as needed (headache).    [provider]  ciprofloxacin (CIPRO) 500 MG tablet Take 1 tablet (500 mg total) by mouth 2 (two) times daily. Patient not taking: Reported on 02/17/2018 11/07/16   Elpidio Anis, PA-C  HYDROcodone-acetaminophen (NORCO/VICODIN) 5-325 MG tablet Take 1 tablet by mouth every 4 (four) hours as needed. Patient not taking: Reported on 02/17/2018 11/07/16   Charlann Lange, PA-C  metroNIDAZOLE (FLAGYL) 500 MG tablet Take 1 tablet (500 mg total) by mouth 3 (three) times daily. Patient not taking: Reported on 02/17/2018 11/07/16   Charlann Lange, PA-C      Medications: . acetaminophen  1,000 mg Oral Q6H  . benazepril  20 mg Oral Daily  . busPIRone  30 mg Oral BID  . desvenlafaxine  50 mg Oral Daily  . gabapentin  300 mg Oral TID  . heparin  5,000 Units Subcutaneous Q8H  . pneumococcal 23 valent vaccine  0.5  mL Intramuscular Tomorrow-1000  . tiZANidine  4 mg Oral QHS  . topiramate  100 mg Oral Daily   . lactated ringers 125 mL/hr at 07/08/19 1430  . piperacillin-tazobactam (ZOSYN)  IV 3.375 g (07/09/19 0631)   PCP:  Harrison Mons, PA  Assessment/Plan Hx IBS/questionable Crohn's disease AKI Hypertension Fibromyalgia -generalized Hx PE -off anticoagulants Tobacco use EtOH use Morbid obesity - BMI 48.79  Acute sigmoid diverticulitis  FEN: IV fluids/clear liquids ID: Zosyn 9/14 >> day 3 DVT: SCDs only; will start Lovenox today. Follow-up: TBD  Plan: Continue IV antibiotics and clear liquids as tolerated.  We are changing the IV to normal saline so she can get her Zosyn she only has 1 small IV access site.    LOS: 1 day    Sherel Fennell 07/09/2019 Please see Amion

## 2019-07-09 NOTE — Progress Notes (Signed)
ANTICOAGULATION CONSULT NOTE - Initial Consult  Pharmacy Consult for lovenox  Indication: VTE prophylaxis  No Known Allergies  Patient Measurements: Height: 5\' 5"  (165.1 cm) Weight: 293 lb 3.4 oz (133 kg) IBW/kg (Calculated) : 57 Heparin Dosing Weight:   Vital Signs: Temp: 98.4 F (36.9 C) (11/16 0521) Temp Source: Oral (11/16 0521) BP: 105/58 (11/16 0521) Pulse Rate: 78 (11/16 0521)  Labs: Recent Labs    07/07/19 2057 07/08/19 0532  HGB 15.0 12.7  HCT 48.6* 41.2  PLT 430* 372  CREATININE 1.26* 1.19*    Estimated Creatinine Clearance: 79.8 mL/min (A) (by C-G formula based on SCr of 1.19 mg/dL (H)).   Medical History: Past Medical History:  Diagnosis Date  . Hypertension 2015  . IBS (irritable bowel syndrome)   . Pulmonary embolism [facility])     Assessment: Patient is a 48 y.o F with hx IBS and diverticulitis presented to the ED on 11/14 with c/o abdominal pain, diarrhea and fever.  She was found to have acute sigmoid diverticulitis.  CCS recommends medical management at this time with abx.  Pharmacy has been consulted to transition patient from heparin SQ to Lovenox for VTE prophylaxis.  - BMI >30 - crcl 80    Plan:  - d/c heparin SQ - start lovenox 60 mg SQ q24h (~0.5 mg/kg/day- adjusted dose for high BMI) - cbc q72h - pharmacy will sign off. Re-consult [place] if need further assistance.  Tranae Laramie P 07/09/2019,12:28 PM

## 2019-07-10 LAB — CBC
HCT: 37.7 % (ref 36.0–46.0)
Hemoglobin: 11.4 g/dL — ABNORMAL LOW (ref 12.0–15.0)
MCH: 28 pg (ref 26.0–34.0)
MCHC: 30.2 g/dL (ref 30.0–36.0)
MCV: 92.6 fL (ref 80.0–100.0)
Platelets: 325 10*3/uL (ref 150–400)
RBC: 4.07 MIL/uL (ref 3.87–5.11)
RDW: 14.9 % (ref 11.5–15.5)
WBC: 11.7 10*3/uL — ABNORMAL HIGH (ref 4.0–10.5)
nRBC: 0 % (ref 0.0–0.2)

## 2019-07-10 MED ORDER — ALUM & MAG HYDROXIDE-SIMETH 200-200-20 MG/5ML PO SUSP
30.0000 mL | Freq: Four times a day (QID) | ORAL | Status: DC | PRN
  Administered 2019-07-10 – 2019-07-14 (×2): 30 mL via ORAL
  Filled 2019-07-10 (×2): qty 30

## 2019-07-10 NOTE — Progress Notes (Signed)
    CC: Abdominal pain  Subjective: Complains of pain with ambulation and getting up.  Some ongoing pain which is primarily in the right lower quadrant.  She seems to be feeling better though and would like to go forward with full liquids.  Objective: Vital signs in last 24 hours: Temp:  [97.6 F (36.4 C)-98.3 F (36.8 C)] 97.6 F (36.4 C) (11/17 0428) Pulse Rate:  [61-65] 61 (11/17 0428) Resp:  [18-20] 19 (11/17 0428) BP: (108-130)/(63-69) 108/63 (11/17 0428) SpO2:  [96 %-99 %] 99 % (11/17 0428) Last BM Date: 07/09/19 Nothing p.o. recorded 472 IV recorded  Stool not recorded Febrile vital signs are stable WBC improving down to 11.7 today.  intake/Output from previous day: 11/16 0701 - 11/17 0700 In: 472.4 [I.V.:421.7; IV Piggyback:50.7] Out: -  Intake/Output this shift: No intake/output data recorded.  General appearance: alert, cooperative and no distress Resp: clear to auscultation bilaterally GI: Soft, complains of pain primarily in the right lower quadrant but she is now that tender to palpation abdomen soft.  She is having bowel movements.  She did have some blood with her bowel movement earlier today.  She has a history of hemorrhoids.  Lab Results:  Recent Labs    07/08/19 0532 07/10/19 0642  WBC 16.2* 11.7*  HGB 12.7 11.4*  HCT 41.2 37.7  PLT 372 325    BMET Recent Labs    07/07/19 2057 07/08/19 0532  NA 139 137  K 3.9 3.8  CL 106 109  CO2 21* 19*  GLUCOSE 127* 134*  BUN 29* 28*  CREATININE 1.26* 1.19*  CALCIUM 8.9 8.2*   PT/INR No results for input(s): LABPROT, INR in the last 72 hours.  Recent Labs  Lab 07/07/19 2057  AST 22  ALT 22  ALKPHOS 73  BILITOT 1.6*  PROT 7.9  ALBUMIN 4.1     Lipase     Component Value Date/Time   LIPASE 17 07/07/2019 2057     Medications: . acetaminophen  1,000 mg Oral Q6H  . benazepril  20 mg Oral Daily  . busPIRone  30 mg Oral BID  . desvenlafaxine  50 mg Oral Daily  . enoxaparin (LOVENOX)  injection  60 mg Subcutaneous Q24H  . gabapentin  300 mg Oral TID  . pneumococcal 23 valent vaccine  0.5 mL Intramuscular Tomorrow-1000  . tiZANidine  4 mg Oral QHS  . topiramate  100 mg Oral Daily   . sodium chloride 100 mL/hr at 07/09/19 1047  . piperacillin-tazobactam (ZOSYN)  IV 3.375 g (07/10/19 0550)   Assessment/Plan Hx IBS/questionable Crohn's disease AKI Hypertension Fibromyalgia -generalized Hx PE -off anticoagulants Tobacco use EtOH use Morbid obesity - BMI 48.79  Acute sigmoid diverticulitis  FEN: IV fluids/clear liquids >> advance to full liquids ID: Zosyn 9/14 >> day 3 DVT: SCDs only;Lovenox Follow-up: TBD  Plan: Continue IV antibiotics and advance to full liquids.  Decrease IV fluids.      LOS: 2 days    Quindarius Cabello 07/10/2019 Please see Amion

## 2019-07-11 ENCOUNTER — Inpatient Hospital Stay (HOSPITAL_COMMUNITY): Payer: BC Managed Care – PPO

## 2019-07-11 LAB — BASIC METABOLIC PANEL
Anion gap: 8 (ref 5–15)
BUN: 17 mg/dL (ref 6–20)
CO2: 19 mmol/L — ABNORMAL LOW (ref 22–32)
Calcium: 8 mg/dL — ABNORMAL LOW (ref 8.9–10.3)
Chloride: 110 mmol/L (ref 98–111)
Creatinine, Ser: 0.91 mg/dL (ref 0.44–1.00)
GFR calc Af Amer: >60 mL/min (ref >60)
GFR calc non Af Amer: >60 mL/min (ref >60)
Glucose, Bld: 104 mg/dL — ABNORMAL HIGH (ref 70–99)
Potassium: 3.2 mmol/L — ABNORMAL LOW (ref 3.5–5.1)
Sodium: 137 mmol/L (ref 135–145)

## 2019-07-11 LAB — COMPREHENSIVE METABOLIC PANEL
ALT: 13 U/L (ref 0–44)
AST: 11 U/L — ABNORMAL LOW (ref 15–41)
Albumin: 3 g/dL — ABNORMAL LOW (ref 3.5–5.0)
Alkaline Phosphatase: 75 U/L (ref 38–126)
Anion gap: 9 (ref 5–15)
BUN: 16 mg/dL (ref 6–20)
CO2: 18 mmol/L — ABNORMAL LOW (ref 22–32)
Calcium: 7.9 mg/dL — ABNORMAL LOW (ref 8.9–10.3)
Chloride: 108 mmol/L (ref 98–111)
Creatinine, Ser: 0.91 mg/dL (ref 0.44–1.00)
GFR calc Af Amer: >60 mL/min (ref >60)
GFR calc non Af Amer: >60 mL/min (ref >60)
Glucose, Bld: 105 mg/dL — ABNORMAL HIGH (ref 70–99)
Potassium: 3.1 mmol/L — ABNORMAL LOW (ref 3.5–5.1)
Sodium: 135 mmol/L (ref 135–145)
Total Bilirubin: 1 mg/dL (ref 0.3–1.2)
Total Protein: 6.2 g/dL — ABNORMAL LOW (ref 6.5–8.1)

## 2019-07-11 LAB — CBC
HCT: 38.1 % (ref 36.0–46.0)
Hemoglobin: 11.8 g/dL — ABNORMAL LOW (ref 12.0–15.0)
MCH: 28.2 pg (ref 26.0–34.0)
MCHC: 31 g/dL (ref 30.0–36.0)
MCV: 91.1 fL (ref 80.0–100.0)
Platelets: 360 10*3/uL (ref 150–400)
RBC: 4.18 MIL/uL (ref 3.87–5.11)
RDW: 14.9 % (ref 11.5–15.5)
WBC: 13.4 10*3/uL — ABNORMAL HIGH (ref 4.0–10.5)
nRBC: 0 % (ref 0.0–0.2)

## 2019-07-11 LAB — MAGNESIUM: Magnesium: 2 mg/dL (ref 1.7–2.4)

## 2019-07-11 MED ORDER — OXYCODONE HCL 5 MG PO TABS
5.0000 mg | ORAL_TABLET | ORAL | Status: DC | PRN
  Administered 2019-07-11 – 2019-07-20 (×12): 10 mg via ORAL
  Filled 2019-07-11 (×16): qty 2

## 2019-07-11 MED ORDER — HYDROMORPHONE HCL 1 MG/ML IJ SOLN
0.5000 mg | INTRAMUSCULAR | Status: DC | PRN
  Administered 2019-07-11 – 2019-07-25 (×33): 1 mg via INTRAVENOUS
  Filled 2019-07-11 (×34): qty 1

## 2019-07-11 MED ORDER — IOHEXOL 300 MG/ML  SOLN
30.0000 mL | Freq: Once | INTRAMUSCULAR | Status: AC | PRN
  Administered 2019-07-11: 30 mL via ORAL

## 2019-07-11 MED ORDER — POTASSIUM CHLORIDE CRYS ER 20 MEQ PO TBCR
20.0000 meq | EXTENDED_RELEASE_TABLET | Freq: Two times a day (BID) | ORAL | Status: DC
  Administered 2019-07-11 – 2019-07-26 (×27): 20 meq via ORAL
  Filled 2019-07-11 (×29): qty 1

## 2019-07-11 MED ORDER — IOHEXOL 300 MG/ML  SOLN
100.0000 mL | Freq: Once | INTRAMUSCULAR | Status: AC | PRN
  Administered 2019-07-11: 100 mL via INTRAVENOUS

## 2019-07-11 NOTE — Progress Notes (Addendum)
    CC: Abdominal pain  Subjective: Having more pain and then had a panic attack after bending over.  Objective: Vital signs in last 24 hours: Temp:  [97.9 F (36.6 C)-99.3 F (37.4 C)] 99.3 F (37.4 C) (11/18 0516) Pulse Rate:  [53-75] 66 (11/18 0754) Resp:  [17-20] 20 (11/18 0754) BP: (112-156)/(63-92) 156/75 (11/18 0754) SpO2:  [95 %-100 %] 100 % (11/18 0754) Last BM Date: 07/10/19 590 p.o. Voided x4 Stool x3 Afebrile vital signs are stable Potassium 3.2 WBC up slightly 13.4 Intake/Output from previous day: 11/17 0701 - 11/18 0700 In: 590 [P.O.:590] Out: -  Intake/Output this shift: No intake/output data recorded.  General appearance: alert, cooperative and no distress Resp: clear to auscultation bilaterally GI: Soft complains of ongoing tenderness increased discomfort bending over.  Lab Results:  Recent Labs    07/10/19 0642 07/11/19 0557  WBC 11.7* 13.4*  HGB 11.4* 11.8*  HCT 37.7 38.1  PLT 325 360    BMET Recent Labs    07/11/19 0557  NA 137  K 3.2*  CL 110  CO2 19*  GLUCOSE 104*  BUN 17  CREATININE 0.91  CALCIUM 8.0*   PT/INR No results for input(s): LABPROT, INR in the last 72 hours.  Recent Labs  Lab 07/07/19 2057  AST 22  ALT 22  ALKPHOS 73  BILITOT 1.6*  PROT 7.9  ALBUMIN 4.1     Lipase     Component Value Date/Time   LIPASE 17 07/07/2019 2057     Medications: . acetaminophen  1,000 mg Oral Q6H  . benazepril  20 mg Oral Daily  . busPIRone  30 mg Oral BID  . desvenlafaxine  50 mg Oral Daily  . enoxaparin (LOVENOX) injection  60 mg Subcutaneous Q24H  . gabapentin  300 mg Oral TID  . pneumococcal 23 valent vaccine  0.5 mL Intramuscular Tomorrow-1000  . tiZANidine  4 mg Oral QHS  . topiramate  100 mg Oral Daily    Assessment/Plan Hx IBS/questionable Crohn's disease AKI Hypertension Fibromyalgia-generalized Back, hip, and leg pain Hx PE-off anticoagulants Tobacco use EtOH use Morbid obesity-BMI  48.79  Acute sigmoid diverticulitis  FEN: IV fluids/clear liquids >> advance to full liquids ID: Zosyn 9/14>>day 5 DVT: SCDs only;Lovenox Follow-up: TBD   Plan: Ongoing discomfort, WBC up slightly.  We will recheck labs and if she is not improved anticipate CT with contrast tomorrow.  Her last CT was 07/07/2019.  Called at [facility] pt complaining of ongoing sharpe stabbing pain right side.  No relieved by Tylenol, Dilaudid, robaxin,tramadol.  Will give her some additional Dilaudid and get CT this afternoon.    LOS: 3 days    Holly Moon 07/11/2019 Please see Amion

## 2019-07-11 NOTE — Progress Notes (Signed)
Pt c/o sharp stabbing pain RUQ/rib cage at 10/10 pain scale ongoing. Unrelieved with scheduled Tylenol and PRN flexeril,  tramadol and Dilaudid. Provider notified.   Received verbal order from [place] to give another 0.5mg  of Dilaudid, & to advise pt will order CT scan as well.

## 2019-07-12 LAB — BASIC METABOLIC PANEL
Anion gap: 10 (ref 5–15)
BUN: 10 mg/dL (ref 6–20)
CO2: 21 mmol/L — ABNORMAL LOW (ref 22–32)
Calcium: 7.6 mg/dL — ABNORMAL LOW (ref 8.9–10.3)
Chloride: 104 mmol/L (ref 98–111)
Creatinine, Ser: 0.95 mg/dL (ref 0.44–1.00)
GFR calc Af Amer: >60 mL/min (ref >60)
GFR calc non Af Amer: >60 mL/min (ref >60)
Glucose, Bld: 110 mg/dL — ABNORMAL HIGH (ref 70–99)
Potassium: 3.6 mmol/L (ref 3.5–5.1)
Sodium: 135 mmol/L (ref 135–145)

## 2019-07-12 NOTE — Progress Notes (Signed)
    CC: Abdominal pain  Subjective: Still has pain on the right side more so than the left today.  She seems less distressed today.  Pain is worse with movement.  Objective: Vital signs in last 24 hours: Temp:  [97.8 F (36.6 C)-98.4 F (36.9 C)] 97.8 F (36.6 C) (11/19 0537) Pulse Rate:  [65-70] 65 (11/19 0537) Resp:  [17-20] 17 (11/19 0537) BP: (117-137)/(61-63) 117/63 (11/19 0537) SpO2:  [94 %-96 %] 96 % (11/19 0537) Last BM Date: 07/10/19 590 p.o. Voided x4 No emesis recorded Stool x3 Afebrile vital signs are stable BMP stable CBC was not done Intake/Output from previous day: No intake/output data recorded. Intake/Output this shift: No intake/output data recorded.  General appearance: alert, cooperative and no distress Resp: clear to auscultation bilaterally GI: She is soft, she is tender to palpation especially on the right side.  No nausea or vomiting positive bowel sounds.  She does not have peritonitis.  Lab Results:  Recent Labs    07/10/19 0642 07/11/19 0557  WBC 11.7* 13.4*  HGB 11.4* 11.8*  HCT 37.7 38.1  PLT 325 360    BMET Recent Labs    07/11/19 0914 07/12/19 0606  NA 135 135  K 3.1* 3.6  CL 108 104  CO2 18* 21*  GLUCOSE 105* 110*  BUN 16 10  CREATININE 0.91 0.95  CALCIUM 7.9* 7.6*   PT/INR No results for input(s): LABPROT, INR in the last 72 hours.  Recent Labs  Lab 07/07/19 2057 07/11/19 0914  AST 22 11*  ALT 22 13  ALKPHOS 73 75  BILITOT 1.6* 1.0  PROT 7.9 6.2*  ALBUMIN 4.1 3.0*     Lipase     Component Value Date/Time   LIPASE 17 07/07/2019 2057     Medications: . acetaminophen  1,000 mg Oral Q6H  . benazepril  20 mg Oral Daily  . busPIRone  30 mg Oral BID  . desvenlafaxine  50 mg Oral Daily  . enoxaparin (LOVENOX) injection  60 mg Subcutaneous Q24H  . gabapentin  300 mg Oral TID  . pneumococcal 23 valent vaccine  0.5 mL Intramuscular Tomorrow-1000  . potassium chloride  20 mEq Oral BID  . tiZANidine  4 mg  Oral QHS  . topiramate  100 mg Oral Daily    Assessment/Plan Hx IBS/questionable Crohn's disease AKI Hypertension Fibromyalgia-generalized Back, hip, and leg pain Hx PE-off anticoagulants Tobacco use EtOH use Morbid obesity-BMI 48.79  Acute sigmoid diverticulitis  -CT 07/11/2019: Diffuse colonic diverticulosis, worsening inflammation around the sigmoid colon with new inflammation along the anterior wall of the ascending colon increased pneumoperitoneum throughout the abdomen pelvis along with perihepatic ascites.  Compatible with perforation around the ascending colon.  She had some of this before but there is more free air on this exam.  FEN: IV fluids/clear liquids>>advancetofull liquids ID: Zosyn 9/14>>day 5 DVT: SCDs only;Lovenox Follow-up: TBD  Plan: Continue antibiotics, I will keep her on clear liquids for now.  Recheck labs in a.m.      LOS: 4 days    Kamill Fulbright 07/12/2019 Please see Amion

## 2019-07-13 LAB — BASIC METABOLIC PANEL
Anion gap: 6 (ref 5–15)
BUN: 7 mg/dL (ref 6–20)
CO2: 23 mmol/L (ref 22–32)
Calcium: 7.7 mg/dL — ABNORMAL LOW (ref 8.9–10.3)
Chloride: 106 mmol/L (ref 98–111)
Creatinine, Ser: 0.83 mg/dL (ref 0.44–1.00)
GFR calc Af Amer: >60 mL/min (ref >60)
GFR calc non Af Amer: >60 mL/min (ref >60)
Glucose, Bld: 98 mg/dL (ref 70–99)
Potassium: 3.2 mmol/L — ABNORMAL LOW (ref 3.5–5.1)
Sodium: 135 mmol/L (ref 135–145)

## 2019-07-13 LAB — CBC
HCT: 41.2 % (ref 36.0–46.0)
Hemoglobin: 12.7 g/dL (ref 12.0–15.0)
MCH: 28 pg (ref 26.0–34.0)
MCHC: 30.8 g/dL (ref 30.0–36.0)
MCV: 90.9 fL (ref 80.0–100.0)
Platelets: 454 10*3/uL — ABNORMAL HIGH (ref 150–400)
RBC: 4.53 MIL/uL (ref 3.87–5.11)
RDW: 15.4 % (ref 11.5–15.5)
WBC: 15 10*3/uL — ABNORMAL HIGH (ref 4.0–10.5)
nRBC: 0 % (ref 0.0–0.2)

## 2019-07-13 MED ORDER — POTASSIUM CHLORIDE IN NACL 20-0.9 MEQ/L-% IV SOLN
INTRAVENOUS | Status: DC
  Administered 2019-07-13 – 2019-07-17 (×7): via INTRAVENOUS
  Filled 2019-07-13 (×10): qty 1000

## 2019-07-13 MED ORDER — PRO-STAT SUGAR FREE PO LIQD
30.0000 mL | Freq: Two times a day (BID) | ORAL | Status: DC
  Administered 2019-07-13 – 2019-07-26 (×12): 30 mL via ORAL
  Filled 2019-07-13 (×14): qty 30

## 2019-07-13 MED ORDER — ENSURE MAX PROTEIN PO LIQD
11.0000 [oz_av] | Freq: Two times a day (BID) | ORAL | Status: DC
  Administered 2019-07-13 – 2019-07-26 (×9): 11 [oz_av] via ORAL
  Filled 2019-07-13 (×27): qty 330

## 2019-07-13 MED ORDER — ADULT MULTIVITAMIN W/MINERALS CH
1.0000 | ORAL_TABLET | Freq: Every day | ORAL | Status: DC
  Administered 2019-07-13 – 2019-07-15 (×3): 1 via ORAL
  Filled 2019-07-13 (×3): qty 1

## 2019-07-13 NOTE — Progress Notes (Signed)
Initial Nutrition Assessment  RD working remotely.   DOCUMENTATION CODES:   Morbid obesity  INTERVENTION:  - will order Ensure Max BID, each supplement provides 150 kcal and 30 grams protein. - will order 30 ml prostat BID, each packet provides 100 kcal and 15 grams protein. - will order daily multivitamin with minerals. - continue to encourage PO intakes and to advance diet as medically feasible.    NUTRITION DIAGNOSIS:   Inadequate oral intake related to other (see comment)(current diet order) as evidenced by other (comment)(FLD does not meet estimated needs.).  GOAL:   Patient will meet greater than or equal to 90% of their needs  MONITOR:   PO intake, Supplement acceptance, Diet advancement, Labs, Weight trends  REASON FOR ASSESSMENT:   Consult Assessment of nutrition requirement/status  ASSESSMENT:   48 y.o. female with hx of HTN, IBS, and diverticulitis. She presented to the ED on 11/14 due to abdominal pain which began on 11/13 after eating pizza for dinner. She experienced N/V x1 PTA. She has family members, including her sister, who tested positive for COVID-19. She reported that Rheumatologist told her she may have Crohn's disease, but it is unclear what this is based on.  Diet changes since admission are as follows: 11/15 at 0853: NPO to CLD 11/17 at 91: CLD to [place] 11/18 at 1955: [place] to NPO 11/19 at 34: NPO to CLD 11/20 at 84: CLD to FLD  The only documented intakes since admission are 100% of lunch on 11/17 and 0% of breakfast this AM. Will order supplements as outlined above to assist while on liquid diet. Surgery PA consult received requesting that patient be provided increased nutrition but not glucose. Supplements ordered provide the least amount of added sugar/carb of those that we have on formulary.   Per chart review, current weight is 293 lb. Weight on 9/30 at [facility] was 336 lb. This indicates 43 lb weight loss (12.7% body weight) in the past 2  months; question accuracy of this but will continue to monitor weight trends closely.   Per notes: - ongoing R-sided abdominal pain--improving and L-side is completely pain-free - acute sigmoid diverticulitis   Labs reviewed; K: 3.2 mmol/l, Ca: 7.7 mg/dl. Medications reviewed; 20 mEq Klor-Con BID.  IVF; NS-20 mEq KCl @ 100 ml/hr.     NUTRITION - FOCUSED PHYSICAL EXAM:  unable to complete at this time.   Diet Order:   Diet Order            Diet full liquid Room service appropriate? Yes; Fluid consistency: Thin  Diet effective now              EDUCATION NEEDS:   Not appropriate for education at this time  Skin:  Skin Assessment: Reviewed RN Assessment  Last BM:  11/19  Height:   Ht Readings from Last 1 Encounters:  07/07/19 5\' 5"  (1.651 m)    Weight:   Wt Readings from Last 1 Encounters:  07/07/19 133 kg    Ideal Body Weight:  56.8 kg  BMI:  Body mass index is 48.79 kg/m.  Estimated Nutritional Needs:   Kcal:  3419-3790 kcal  Protein:  100-113 grams  Fluid:  >/= 2 L/day      Jarome Matin, MS, RD, LDN, [facility] Inpatient Clinical Dietitian Pager # [phone] After hours/weekend pager # [phone]

## 2019-07-13 NOTE — Progress Notes (Signed)
    CC: Abdominal pain  Subjective: She feels better this a.m.  She still has pain primarily on the right side.  The left side is completely pain-free.  Pain is with movement and palpation.  She does not seem to be significantly uncomfortable with either.  She came from the bathroom into the room without any issues.  Objective: Vital signs in last 24 hours: Temp:  [98.3 F (36.8 C)-99.1 F (37.3 C)] 98.3 F (36.8 C) (11/20 0445) Pulse Rate:  [64-84] 64 (11/20 0445) Resp:  [18-19] 18 (11/20 0445) BP: (135-144)/(59-71) 135/71 (11/20 0445) SpO2:  [91 %-97 %] 97 % (11/20 0445) Last BM Date: 07/12/19 240 p.o. IV not recorded Voided x3 No BM recorded Afebrile vital signs are stable WBC still elevated 15.0 K+ 3.2 CT 11/18 Intake/Output from previous day: 11/19 0701 - 11/20 0700 In: 240 [P.O.:240] Out: -  Intake/Output this shift: No intake/output data recorded.  General appearance: alert, cooperative and no distress Resp: clear to auscultation bilaterally GI: Soft, no tenderness on the left side at all.  She still has some right upper and lower quadrant discomfort on palpation.  Lab Results:  Recent Labs    07/11/19 0557 07/13/19 0616  WBC 13.4* 15.0*  HGB 11.8* 12.7  HCT 38.1 41.2  PLT 360 454*    BMET Recent Labs    07/12/19 0606 07/13/19 0616  NA 135 135  K 3.6 3.2*  CL 104 106  CO2 21* 23  GLUCOSE 110* 98  BUN 10 7  CREATININE 0.95 0.83  CALCIUM 7.6* 7.7*   PT/INR No results for input(s): LABPROT, INR in the last 72 hours.  Recent Labs  Lab 07/07/19 2057 07/11/19 0914  AST 22 11*  ALT 22 13  ALKPHOS 73 75  BILITOT 1.6* 1.0  PROT 7.9 6.2*  ALBUMIN 4.1 3.0*     Lipase     Component Value Date/Time   LIPASE 17 07/07/2019 2057     Medications: . acetaminophen  1,000 mg Oral Q6H  . benazepril  20 mg Oral Daily  . busPIRone  30 mg Oral BID  . desvenlafaxine  50 mg Oral Daily  . enoxaparin (LOVENOX) injection  60 mg Subcutaneous Q24H  .  gabapentin  300 mg Oral TID  . pneumococcal 23 valent vaccine  0.5 mL Intramuscular Tomorrow-1000  . potassium chloride  20 mEq Oral BID  . tiZANidine  4 mg Oral QHS  . topiramate  100 mg Oral Daily   . sodium chloride 100 mL/hr at 07/13/19 0046  . 0.9 % NaCl with KCl 20 mEq / L 100 mL/hr at 07/13/19 3295  . piperacillin-tazobactam (ZOSYN)  IV 3.375 g (07/13/19 1884)   Assessment/Plan Hx IBS/questionable Crohn's disease AKI Hypertension Fibromyalgia-generalized Back,hip,and leg pain Hx PE-off anticoagulants Tobacco use EtOH use Morbid obesity-BMI 48.79  Acute sigmoid diverticulitis  -CT 07/11/2019: Diffuse colonic diverticulosis, worsening inflammation around the sigmoid colon with new inflammation along the anterior wall of the ascending colon increased pneumoperitoneum throughout the abdomen pelvis along with perihepatic ascites.  Compatible with perforation around the ascending colon.  She had some of this before but there is more free air on this exam.  FEN: IV fluids/clear liquids>>advancetofull liquids ID: Zosyn 9/14>>day5 DVT: SCDs only;Lovenox Follow-up: TBD  Plan: Again advance her to full liquids and see how she does.  Recheck labs in AM.   LOS: 5 days    Holly Moon 07/13/2019 Please see Amion

## 2019-07-14 LAB — CBC
HCT: 37.1 % (ref 36.0–46.0)
Hemoglobin: 11.4 g/dL — ABNORMAL LOW (ref 12.0–15.0)
MCH: 27.9 pg (ref 26.0–34.0)
MCHC: 30.7 g/dL (ref 30.0–36.0)
MCV: 90.9 fL (ref 80.0–100.0)
Platelets: 476 10*3/uL — ABNORMAL HIGH (ref 150–400)
RBC: 4.08 MIL/uL (ref 3.87–5.11)
RDW: 15.6 % — ABNORMAL HIGH (ref 11.5–15.5)
WBC: 15.1 10*3/uL — ABNORMAL HIGH (ref 4.0–10.5)
nRBC: 0 % (ref 0.0–0.2)

## 2019-07-14 LAB — BASIC METABOLIC PANEL
Anion gap: 7 (ref 5–15)
BUN: 7 mg/dL (ref 6–20)
CO2: 23 mmol/L (ref 22–32)
Calcium: 8 mg/dL — ABNORMAL LOW (ref 8.9–10.3)
Chloride: 109 mmol/L (ref 98–111)
Creatinine, Ser: 0.74 mg/dL (ref 0.44–1.00)
GFR calc Af Amer: >60 mL/min (ref >60)
GFR calc non Af Amer: >60 mL/min (ref >60)
Glucose, Bld: 92 mg/dL (ref 70–99)
Potassium: 3.7 mmol/L (ref 3.5–5.1)
Sodium: 139 mmol/L (ref 135–145)

## 2019-07-14 NOTE — Progress Notes (Signed)
    CC: Abdominal pain  Subjective: She feels better this a.m.  Mainly having pain when she first gets up out of bed.   Pain is with movement and palpation.  She does not seem to be significantly uncomfortable sitting up eating breakfast  Objective: Vital signs in last 24 hours: Temp:  [98.2 F (36.8 C)-98.9 F (37.2 C)] 98.3 F (36.8 C) (11/21 0439) Pulse Rate:  [66-88] 71 (11/21 0439) Resp:  [16-17] 16 (11/21 0439) BP: (135-181)/(66-93) 135/66 (11/21 0439) SpO2:  [96 %-98 %] 97 % (11/21 0439) Last BM Date: 07/13/19  Intake/Output from previous day: 11/20 0701 - 11/21 0700 In: 8285.7 [P.O.:1320; I.V.:6508.9; IV Piggyback:456.8] Out: -  Intake/Output this shift: No intake/output data recorded.  General appearance: alert, cooperative and no distress GI: Soft  Lab Results:  Recent Labs    07/13/19 0616 07/14/19 0602  WBC 15.0* 15.1*  HGB 12.7 11.4*  HCT 41.2 37.1  PLT 454* 476*    BMET Recent Labs    07/13/19 0616 07/14/19 0602  NA 135 139  K 3.2* 3.7  CL 106 109  CO2 23 23  GLUCOSE 98 92  BUN 7 7  CREATININE 0.83 0.74  CALCIUM 7.7* 8.0*   PT/INR No results for input(s): LABPROT, INR in the last 72 hours.  Recent Labs  Lab 07/07/19 2057 07/11/19 0914  AST 22 11*  ALT 22 13  ALKPHOS 73 75  BILITOT 1.6* 1.0  PROT 7.9 6.2*  ALBUMIN 4.1 3.0*     Lipase     Component Value Date/Time   LIPASE 17 07/07/2019 2057     Medications: . acetaminophen  1,000 mg Oral Q6H  . benazepril  20 mg Oral Daily  . busPIRone  30 mg Oral BID  . desvenlafaxine  50 mg Oral Daily  . enoxaparin (LOVENOX) injection  60 mg Subcutaneous Q24H  . feeding supplement (PRO-STAT SUGAR FREE 64)  30 mL Oral BID  . gabapentin  300 mg Oral TID  . multivitamin with minerals  1 tablet Oral Daily  . pneumococcal 23 valent vaccine  0.5 mL Intramuscular Tomorrow-1000  . potassium chloride  20 mEq Oral BID  . Ensure Max Protein  11 oz Oral BID  . tiZANidine  4 mg Oral QHS  .  topiramate  100 mg Oral Daily   . sodium chloride 100 mL/hr at 07/13/19 0048  . 0.9 % NaCl with KCl 20 mEq / L 100 mL/hr at 07/13/19 1955  . piperacillin-tazobactam (ZOSYN)  IV 3.375 g (07/14/19 0557)   Assessment/Plan Hx IBS/questionable Crohn's disease AKI Hypertension Fibromyalgia-generalized Back,hip,and leg pain Hx PE-off anticoagulants Tobacco use EtOH use Morbid obesity-BMI 48.79  Acute sigmoid diverticulitis  -CT 07/11/2019: Diffuse colonic diverticulosis, worsening inflammation around the sigmoid colon with new inflammation along the anterior wall of the ascending colon increased pneumoperitoneum throughout the abdomen pelvis along with perihepatic ascites.  FEN: IV fluids/clear liquids>>Contfull liquids ID: Zosyn 9/14>>day6 DVT: SCDs only;Lovenox Follow-up: TBD  Plan: Cont full liquids and see how she does.  Recheck labs in AM.  May need repeat CT if wbc continues to be elevated   LOS: 6 days    Rosario Adie 65/68/1275 Please see Amion

## 2019-07-15 LAB — CBC
HCT: 37.3 % (ref 36.0–46.0)
Hemoglobin: 11.5 g/dL — ABNORMAL LOW (ref 12.0–15.0)
MCH: 27.8 pg (ref 26.0–34.0)
MCHC: 30.8 g/dL (ref 30.0–36.0)
MCV: 90.3 fL (ref 80.0–100.0)
Platelets: 460 10*3/uL — ABNORMAL HIGH (ref 150–400)
RBC: 4.13 MIL/uL (ref 3.87–5.11)
RDW: 15.7 % — ABNORMAL HIGH (ref 11.5–15.5)
WBC: 13.3 10*3/uL — ABNORMAL HIGH (ref 4.0–10.5)
nRBC: 0 % (ref 0.0–0.2)

## 2019-07-15 MED ORDER — LIP MEDEX EX OINT
TOPICAL_OINTMENT | CUTANEOUS | Status: DC | PRN
  Filled 2019-07-15 (×2): qty 7

## 2019-07-15 NOTE — Progress Notes (Signed)
    CC: Abdominal pain  Subjective: She feels better this a.m.  Had a fever to 102 yesterday.  Doesn't feel like eating much.  Ambulating better Objective: Vital signs in last 24 hours: Temp:  [98.4 F (36.9 C)-101.9 F (38.8 C)] 99.8 F (37.7 C) (11/22 0529) Pulse Rate:  [79-83] 79 (11/22 0529) Resp:  [20-24] 20 (11/22 0529) BP: (143-186)/(75-97) 150/79 (11/22 0529) SpO2:  [95 %-100 %] 96 % (11/22 0529) Last BM Date: 07/13/19  Intake/Output from previous day: 11/21 0701 - 11/22 0700 In: 3518.1 [I.V.:3317.3; IV Piggyback:200.8] Out: -  Intake/Output this shift: No intake/output data recorded.  General appearance: alert, cooperative and no distress GI: Soft  Lab Results:  Recent Labs    07/14/19 0602 07/15/19 0545  WBC 15.1* 13.3*  HGB 11.4* 11.5*  HCT 37.1 37.3  PLT 476* 460*    BMET Recent Labs    07/13/19 0616 07/14/19 0602  NA 135 139  K 3.2* 3.7  CL 106 109  CO2 23 23  GLUCOSE 98 92  BUN 7 7  CREATININE 0.83 0.74  CALCIUM 7.7* 8.0*   PT/INR No results for input(s): LABPROT, INR in the last 72 hours.  Recent Labs  Lab 07/11/19 0914  AST 11*  ALT 13  ALKPHOS 75  BILITOT 1.0  PROT 6.2*  ALBUMIN 3.0*     Lipase     Component Value Date/Time   LIPASE 17 07/07/2019 2057     Medications: . acetaminophen  1,000 mg Oral Q6H  . benazepril  20 mg Oral Daily  . busPIRone  30 mg Oral BID  . desvenlafaxine  50 mg Oral Daily  . enoxaparin (LOVENOX) injection  60 mg Subcutaneous Q24H  . feeding supplement (PRO-STAT SUGAR FREE 64)  30 mL Oral BID  . gabapentin  300 mg Oral TID  . multivitamin with minerals  1 tablet Oral Daily  . pneumococcal 23 valent vaccine  0.5 mL Intramuscular Tomorrow-1000  . potassium chloride  20 mEq Oral BID  . Ensure Max Protein  11 oz Oral BID  . tiZANidine  4 mg Oral QHS  . topiramate  100 mg Oral Daily   . sodium chloride 100 mL/hr at 07/13/19 0048  . 0.9 % NaCl with KCl 20 mEq / L 100 mL/hr at 07/15/19 0548   . piperacillin-tazobactam (ZOSYN)  IV 3.375 g (07/15/19 1856)   Assessment/Plan Hx IBS/questionable Crohn's disease AKI Hypertension Fibromyalgia-generalized Back,hip,and leg pain Hx PE-off anticoagulants Tobacco use EtOH use Morbid obesity-BMI 48.79  Acute sigmoid diverticulitis  -CT 07/11/2019: Diffuse colonic diverticulosis, worsening inflammation around the sigmoid colon with new inflammation along the anterior wall of the ascending colon increased pneumoperitoneum throughout the abdomen pelvis along with perihepatic ascites.  FEN: IV fluids/clear liquids>>Contfull liquids ID: Zosyn 9/14>>day6 DVT: SCDs only;Lovenox Follow-up: TBD  Plan: Cont full liquids and see how she does.  Recheck labs in AM.  Will repeat CT tom if she continues to have fevers   LOS: 7 days    Rosario Adie 31/49/7026 Please see Amion

## 2019-07-16 ENCOUNTER — Inpatient Hospital Stay (HOSPITAL_COMMUNITY): Payer: BC Managed Care – PPO

## 2019-07-16 ENCOUNTER — Other Ambulatory Visit: Payer: Self-pay | Admitting: General Surgery

## 2019-07-16 LAB — CBC
HCT: 40.1 % (ref 36.0–46.0)
Hemoglobin: 12.2 g/dL (ref 12.0–15.0)
MCH: 27.7 pg (ref 26.0–34.0)
MCHC: 30.4 g/dL (ref 30.0–36.0)
MCV: 90.9 fL (ref 80.0–100.0)
Platelets: 446 10*3/uL — ABNORMAL HIGH (ref 150–400)
RBC: 4.41 MIL/uL (ref 3.87–5.11)
RDW: 16.1 % — ABNORMAL HIGH (ref 11.5–15.5)
WBC: 11.5 10*3/uL — ABNORMAL HIGH (ref 4.0–10.5)
nRBC: 0 % (ref 0.0–0.2)

## 2019-07-16 LAB — COMPREHENSIVE METABOLIC PANEL
ALT: 15 U/L (ref 0–44)
AST: 22 U/L (ref 15–41)
Albumin: 2.3 g/dL — ABNORMAL LOW (ref 3.5–5.0)
Alkaline Phosphatase: 94 U/L (ref 38–126)
Anion gap: 10 (ref 5–15)
BUN: 7 mg/dL (ref 6–20)
CO2: 20 mmol/L — ABNORMAL LOW (ref 22–32)
Calcium: 8 mg/dL — ABNORMAL LOW (ref 8.9–10.3)
Chloride: 105 mmol/L (ref 98–111)
Creatinine, Ser: 0.69 mg/dL (ref 0.44–1.00)
GFR calc Af Amer: >60 mL/min (ref >60)
GFR calc non Af Amer: >60 mL/min (ref >60)
Glucose, Bld: 93 mg/dL (ref 70–99)
Potassium: 3.9 mmol/L (ref 3.5–5.1)
Sodium: 135 mmol/L (ref 135–145)
Total Bilirubin: 0.4 mg/dL (ref 0.3–1.2)
Total Protein: 6.6 g/dL (ref 6.5–8.1)

## 2019-07-16 MED ORDER — IOHEXOL 9 MG/ML PO SOLN
ORAL | Status: AC
  Administered 2019-07-16: 500 mL via ORAL
  Filled 2019-07-16: qty 1000

## 2019-07-16 MED ORDER — IOHEXOL 9 MG/ML PO SOLN
500.0000 mL | ORAL | Status: AC
  Administered 2019-07-16: 15:00:00 500 mL via ORAL

## 2019-07-16 MED ORDER — SODIUM CHLORIDE (PF) 0.9 % IJ SOLN
INTRAMUSCULAR | Status: AC
  Filled 2019-07-16: qty 50

## 2019-07-16 MED ORDER — IOHEXOL 300 MG/ML  SOLN
100.0000 mL | Freq: Once | INTRAMUSCULAR | Status: AC | PRN
  Administered 2019-07-16: 100 mL via INTRAVENOUS

## 2019-07-16 MED ORDER — IOHEXOL 350 MG/ML SOLN
100.0000 mL | Freq: Once | INTRAVENOUS | Status: AC | PRN
  Administered 2019-07-16: 80 mL via INTRAVENOUS

## 2019-07-16 NOTE — Progress Notes (Signed)
Patient complaining of shortness of breath after returning to the unit from CT. Patients vital signs 164/69 Bp 95 SP02. 90 pulse. 20 RR. Rapid response notified. Recommendation to page the MD. Dr. Barry Dienes paged. Will order CT chest and give the lovenox dose which was refused by the patient earlier in the day.

## 2019-07-16 NOTE — Progress Notes (Signed)
   Vital Signs MEWS/VS Documentation      07/15/2019 2046 07/16/2019 0535 07/16/2019 0918 07/16/2019 1342   MEWS Score:  0  0  0  5   MEWS Score Color:  Green  Green  Green  Red   Resp:  17  18  -  (!) 36   Pulse:  79  66  -  95   BP:  (!) 160/86  (!) 152/75  -  (!) 156/83   Temp:  99.3 F (37.4 C)  97.9 F (36.6 C)  -  (!) 103 F (39.4 C)   O2 Device:  Room Air  Room Air  -  Room Air   Level of Consciousness:  -  -  Alert  Alert      RR notified. Tylenol given.  [facility] floor coverage paged.     Hulda Humphrey, [place] 07/16/2019,2:14 PM

## 2019-07-16 NOTE — Progress Notes (Signed)
    CC: Abdominal pain  Subjective: She feels better this a.m.  No fevers yesterday.  Eating some.  Ambulating better Objective: Vital signs in last 24 hours: Temp:  [97.9 F (36.6 C)-99.3 F (37.4 C)] 97.9 F (36.6 C) (11/23 0535) Pulse Rate:  [66-79] 66 (11/23 0535) Resp:  [16-18] 18 (11/23 0535) BP: (151-160)/(75-99) 152/75 (11/23 0535) SpO2:  [98 %-100 %] 100 % (11/23 0535) Last BM Date: 07/15/19  Intake/Output from previous day: No intake/output data recorded. Intake/Output this shift: No intake/output data recorded.  General appearance: alert, cooperative and no distress GI: Soft, TTP RUQ  Lab Results:  Recent Labs    07/14/19 0602 07/15/19 0545  WBC 15.1* 13.3*  HGB 11.4* 11.5*  HCT 37.1 37.3  PLT 476* 460*    BMET Recent Labs    07/14/19 0602  NA 139  K 3.7  CL 109  CO2 23  GLUCOSE 92  BUN 7  CREATININE 0.74  CALCIUM 8.0*   PT/INR No results for input(s): LABPROT, INR in the last 72 hours.  Recent Labs  Lab 07/11/19 0914  AST 11*  ALT 13  ALKPHOS 75  BILITOT 1.0  PROT 6.2*  ALBUMIN 3.0*     Lipase     Component Value Date/Time   LIPASE 17 07/07/2019 2057     Medications: . acetaminophen  1,000 mg Oral Q6H  . benazepril  20 mg Oral Daily  . busPIRone  30 mg Oral BID  . desvenlafaxine  50 mg Oral Daily  . enoxaparin (LOVENOX) injection  60 mg Subcutaneous Q24H  . feeding supplement (PRO-STAT SUGAR FREE 64)  30 mL Oral BID  . gabapentin  300 mg Oral TID  . multivitamin with minerals  1 tablet Oral Daily  . pneumococcal 23 valent vaccine  0.5 mL Intramuscular Tomorrow-1000  . potassium chloride  20 mEq Oral BID  . Ensure Max Protein  11 oz Oral BID  . tiZANidine  4 mg Oral QHS  . topiramate  100 mg Oral Daily   . 0.9 % NaCl with KCl 20 mEq / L 100 mL/hr at 07/16/19 0529  . piperacillin-tazobactam (ZOSYN)  IV 3.375 g (07/16/19 0603)   Assessment/Plan Hx IBS/questionable Crohn's  disease AKI Hypertension Fibromyalgia-generalized Back,hip,and leg pain Hx PE-off anticoagulants Tobacco use EtOH use Morbid obesity-BMI 48.79  Acute sigmoid diverticulitis  -CT 07/11/2019: Diffuse colonic diverticulosis, worsening inflammation around the sigmoid colon with new inflammation along the anterior wall of the ascending colon increased pneumoperitoneum throughout the abdomen pelvis along with perihepatic ascites.  FEN: IV fluids/clear liquids>>advance to soft diet ID: Zosyn 9/14>>day7 DVT: SCDs only;Lovenox Follow-up: TBD  Plan: Try soft diet. Awating CBC.  Will repeat CT tom if she continues if wbc elevated   LOS: 8 days    Holly Moon 43/15/4008 Please see Amion

## 2019-07-16 NOTE — Significant Event (Signed)
Rapid Response Event Note  Overview: Call Time: [id number] Respond Time: 5573 Event Type: MEWS  Initial Focused Assessment:  Called to assess patient due to change in MEWS status. On assessment patient resting in bed, tachypnic however in no acute distress. Patient has fever of 103, accompanied by tachycardia. Bedside nursing staff had already administered Tylenol. Patient has had a past PE but reports this tachypnea is different. No new respiratory pain. Lung sounds clear on auscultation. Blood pressure stable, alert and oriented. Patient assisted to sit up in bed.   Plan of Care (if not transferred):  Plan to monitor patients response to Tylenol, and recheck vital signs per unit MEWS protocol. Please call RRT back if no change in Vital signs with resolve of fever.   Event Summary:  Outcome: Stayed in room and stabalized  Event End Time: [place]

## 2019-07-17 ENCOUNTER — Inpatient Hospital Stay: Payer: Self-pay

## 2019-07-17 DIAGNOSIS — K5792 Diverticulitis of intestine, part unspecified, without perforation or abscess without bleeding: Secondary | ICD-10-CM

## 2019-07-17 DIAGNOSIS — J189 Pneumonia, unspecified organism: Secondary | ICD-10-CM

## 2019-07-17 LAB — FERRITIN: Ferritin: 221 ng/mL (ref 11–307)

## 2019-07-17 LAB — PROCALCITONIN: Procalcitonin: 3.36 ng/mL

## 2019-07-17 LAB — COMPREHENSIVE METABOLIC PANEL
ALT: 14 U/L (ref 0–44)
AST: 23 U/L (ref 15–41)
Albumin: 2.4 g/dL — ABNORMAL LOW (ref 3.5–5.0)
Alkaline Phosphatase: 87 U/L (ref 38–126)
Anion gap: 9 (ref 5–15)
BUN: 6 mg/dL (ref 6–20)
CO2: 22 mmol/L (ref 22–32)
Calcium: 7.7 mg/dL — ABNORMAL LOW (ref 8.9–10.3)
Chloride: 104 mmol/L (ref 98–111)
Creatinine, Ser: 0.72 mg/dL (ref 0.44–1.00)
GFR calc Af Amer: >60 mL/min (ref >60)
GFR calc non Af Amer: >60 mL/min (ref >60)
Glucose, Bld: 83 mg/dL (ref 70–99)
Potassium: 3.6 mmol/L (ref 3.5–5.1)
Sodium: 135 mmol/L (ref 135–145)
Total Bilirubin: 0.7 mg/dL (ref 0.3–1.2)
Total Protein: 6.8 g/dL (ref 6.5–8.1)

## 2019-07-17 LAB — CBC
HCT: 35.1 % — ABNORMAL LOW (ref 36.0–46.0)
Hemoglobin: 11 g/dL — ABNORMAL LOW (ref 12.0–15.0)
MCH: 27.7 pg (ref 26.0–34.0)
MCHC: 31.3 g/dL (ref 30.0–36.0)
MCV: 88.4 fL (ref 80.0–100.0)
Platelets: 366 10*3/uL (ref 150–400)
RBC: 3.97 MIL/uL (ref 3.87–5.11)
RDW: 15.9 % — ABNORMAL HIGH (ref 11.5–15.5)
WBC: 16.7 10*3/uL — ABNORMAL HIGH (ref 4.0–10.5)
nRBC: 0 % (ref 0.0–0.2)

## 2019-07-17 LAB — SARS CORONAVIRUS 2 (TAT 6-24 HRS): SARS Coronavirus 2: POSITIVE — AB

## 2019-07-17 LAB — LACTATE DEHYDROGENASE: LDH: 179 U/L (ref 98–192)

## 2019-07-17 LAB — MAGNESIUM: Magnesium: 2.1 mg/dL (ref 1.7–2.4)

## 2019-07-17 LAB — PHOSPHORUS: Phosphorus: 2.8 mg/dL (ref 2.5–4.6)

## 2019-07-17 LAB — C-REACTIVE PROTEIN: CRP: 30.4 mg/dL — ABNORMAL HIGH (ref <1.0)

## 2019-07-17 LAB — PROTIME-INR
INR: 1.5 — ABNORMAL HIGH (ref 0.8–1.2)
Prothrombin Time: 18 seconds — ABNORMAL HIGH (ref 11.4–15.2)

## 2019-07-17 LAB — D-DIMER, QUANTITATIVE: D-Dimer, Quant: 9.11 ug/mL-FEU — ABNORMAL HIGH (ref 0.00–0.50)

## 2019-07-17 LAB — PREALBUMIN: Prealbumin: <5 mg/dL — ABNORMAL LOW (ref 18–38)

## 2019-07-17 MED ORDER — CHLORHEXIDINE GLUCONATE CLOTH 2 % EX PADS
6.0000 | MEDICATED_PAD | Freq: Every day | CUTANEOUS | Status: DC
  Administered 2019-07-18 – 2019-07-25 (×9): 6 via TOPICAL

## 2019-07-17 MED ORDER — SODIUM CHLORIDE 0.9 % IV SOLN
200.0000 mg | Freq: Once | INTRAVENOUS | Status: AC
  Administered 2019-07-17: 200 mg via INTRAVENOUS
  Filled 2019-07-17: qty 40

## 2019-07-17 MED ORDER — SODIUM CHLORIDE 0.9 % IV SOLN
100.0000 mg | Freq: Every day | INTRAVENOUS | Status: AC
  Administered 2019-07-19 – 2019-07-21 (×3): 100 mg via INTRAVENOUS
  Filled 2019-07-17 (×3): qty 20

## 2019-07-17 MED ORDER — DEXAMETHASONE SODIUM PHOSPHATE 10 MG/ML IJ SOLN
6.0000 mg | INTRAMUSCULAR | Status: DC
  Administered 2019-07-17 – 2019-07-18 (×2): 6 mg via INTRAVENOUS
  Filled 2019-07-17 (×2): qty 1

## 2019-07-17 MED ORDER — SODIUM CHLORIDE 0.9% FLUSH
10.0000 mL | INTRAVENOUS | Status: DC | PRN
  Administered 2019-07-18 – 2019-07-20 (×2): 10 mL
  Filled 2019-07-17 (×2): qty 40

## 2019-07-17 MED ORDER — TRAVASOL 10 % IV SOLN
INTRAVENOUS | Status: AC
  Administered 2019-07-17: 18:00:00 via INTRAVENOUS
  Filled 2019-07-17: qty 528

## 2019-07-17 MED ORDER — POTASSIUM CHLORIDE IN NACL 20-0.9 MEQ/L-% IV SOLN
INTRAVENOUS | Status: AC
  Administered 2019-07-17 – 2019-07-18 (×2): via INTRAVENOUS
  Filled 2019-07-17 (×2): qty 1000

## 2019-07-17 MED ORDER — VANCOMYCIN HCL 10 G IV SOLR
2500.0000 mg | Freq: Once | INTRAVENOUS | Status: AC
  Administered 2019-07-17: 2500 mg via INTRAVENOUS
  Filled 2019-07-17 (×2): qty 2500

## 2019-07-17 MED ORDER — SODIUM CHLORIDE 0.9 % IV SOLN
100.0000 mg | Freq: Once | INTRAVENOUS | Status: AC
  Administered 2019-07-18: 100 mg via INTRAVENOUS
  Filled 2019-07-17: qty 20

## 2019-07-17 MED ORDER — INSULIN ASPART 100 UNIT/ML ~~LOC~~ SOLN
0.0000 [IU] | Freq: Four times a day (QID) | SUBCUTANEOUS | Status: DC
  Administered 2019-07-18: 2 [IU] via SUBCUTANEOUS
  Administered 2019-07-18 – 2019-07-19 (×2): 1 [IU] via SUBCUTANEOUS
  Administered 2019-07-19: 3 [IU] via SUBCUTANEOUS

## 2019-07-17 MED ORDER — FUROSEMIDE 10 MG/ML IJ SOLN
40.0000 mg | Freq: Once | INTRAMUSCULAR | Status: AC
  Administered 2019-07-17: 40 mg via INTRAVENOUS
  Filled 2019-07-17: qty 4

## 2019-07-17 MED ORDER — VANCOMYCIN HCL 10 G IV SOLR
1250.0000 mg | Freq: Two times a day (BID) | INTRAVENOUS | Status: DC
  Administered 2019-07-18 – 2019-07-19 (×3): 1250 mg via INTRAVENOUS
  Filled 2019-07-17 (×4): qty 1250

## 2019-07-17 NOTE — Progress Notes (Signed)
Pharmacy Antibiotic Note  Holly Moon is a 48 y.o. female admitted on 07/07/2019 with  abd pain, acute sigmoid diverticulitis with pneumoperitoneum, ascites .  Pharmacy has been consulted for Vancomycin dosing for new bilateral lung infiltrates, PNA vs Covid (admit Covid neg)  Plan: No change Zosyn 3.375gm q8, 4 hr infusion Vancomycin 2500mg  x1, then 1250mg  q12 for AUC 476, using SCr 0.69, Vd 0.5 Repeat Covid test  Height: 5\' 5"  (165.1 cm) Weight: 293 lb 3.4 oz (133 kg) IBW/kg (Calculated) : 57  Temp (24hrs), Avg:99.9 F (37.7 C), Min:98.3 F (36.8 C), Max:103 F (39.4 C)  Recent Labs  Lab 07/11/19 0914 07/12/19 0606 07/13/19 0616 07/14/19 0602 07/15/19 0545 07/16/19 0548 07/16/19 1433 07/17/19 0615  WBC  --   --  15.0* 15.1* 13.3* 11.5*  --  16.7*  CREATININE 0.91 0.95 0.83 0.74  --   --  0.69  --     Estimated Creatinine Clearance: 118.7 mL/min (by C-G formula based on SCr of 0.69 mg/dL).    No Known Allergies  Antimicrobials this admission: 11/15 Zosyn >>  11/24 Vanc >>   Dose adjustments this admission:  Microbiology results: 11/24 BCx (x1 set): sent 11/14 Covid: neg 11/24 Covid: pending  Thank you for allowing pharmacy to be a part of this patient's care.  Minda Ditto PharmD 07/17/2019 9:56 AM

## 2019-07-17 NOTE — TOC Progression Note (Signed)
Transition of Care [facility]) - Progression Note    Patient Details  Name: Holly Moon MRN: [id number] Date of Birth: 05/19/1971  [facility] CM/SW Contact  Purcell Mouton, RN Phone Number: 07/17/2019, 4:37 PM  Clinical Narrative:    Pt transferred from 5W to 1429.        Expected Discharge Plan and Services                                                 Social Determinants of Health (SDOH) Interventions    Readmission Risk Interventions No flowsheet data found.

## 2019-07-17 NOTE — Consult Note (Addendum)
Chief Complaint: Patient was evaluated in consultation today for CT-guided drainage of abdominal fluid collection/abscesses Chief Complaint  Patient presents with   Abdominal Pain   Near Syncope   Fever   Diarrhea    Referring Physician(s): Thomas,A  Supervising Physician: Simonne ComeWatts, John  [facility]  History of Present Illness: Holly Moon is a 48 y.o. female with history of hypertension, irritable bowel syndrome, fibromyalgia, prior PE, morbid obesity, and currently Covid positive who [facility] with lower abdominal pain as well as intermittent fevers.  Subsequent imaging revealed :   There are changes consistent with progressive inflammatory change involving the colon primarily within the ascending colon in an area where prior small pericolonic air collections were noted. This results in a large somewhat bilobed air-fluid collection which extends along the lateral aspect of the liver as well as anterior to the ascending colon. It is possible the collection anterior to the ascending colon is dilatation of the ascending colon although felt to be less likely given the overall appearance. A third collection is noted along the course of the sigmoid colon consistent with increasing pericolonic abscess as described.  Bilateral pleural effusions which have increased in the interval from the prior exam. Additionally increasing patchy infiltrate is noted throughout both lungs primarily within the bases although now seen within the upper lobes bilaterally  Last temp 100.3.  Creatinine 0.72, WBC 16.7, hemoglobin 11, platelets 366K; PT 18, INR 1.5.  Request now received from surgery for image guided drainage of abdominal fluid collections.      Past Medical History:  Diagnosis Date   Hypertension 2015   IBS (irritable bowel syndrome)    Pulmonary embolism (HCC)     Past Surgical History:  Procedure Laterality  Date   FRACTURE SURGERY Left 1987   leg fracture   RIGHT CARPAL     WRIST FRACTURE SURGERY      Allergies: Patient has no known allergies.  Medications: Prior to Admission medications   Medication Sig Start Date End Date Taking? Authorizing Provider  ALPRAZolam Prudy Feeler(XANAX) 0.5 MG tablet Take 0.5 mg by mouth 2 (two) times daily as needed. 06/30/19  Yes [provider]  aspirin EC 81 MG tablet Take 81 mg by mouth daily.   Yes [provider]  benazepril (LOTENSIN) 20 MG tablet Take 20 mg by mouth daily.   Yes [provider]  busPIRone (BUSPAR) 30 MG tablet Take 1 tablet by mouth 2 (two) times daily. 06/29/19  Yes [provider]  desvenlafaxine (PRISTIQ) 50 MG 24 hr tablet Take 50 mg by mouth daily. 07/04/19  Yes [provider]  gabapentin (NEURONTIN) 300 MG capsule  01/25/18  Yes [provider]  naproxen (NAPROSYN) 500 MG tablet  02/04/18  Yes [provider]  tiZANidine (ZANAFLEX) 4 MG tablet  02/04/18  Yes [provider]  topiramate (TOPAMAX) 100 MG tablet Take 1 tablet by mouth daily. 06/29/19  Yes [provider]  venlafaxine XR (EFFEXOR-XR) 150 MG 24 hr capsule Take 1 capsule by mouth daily. 06/29/19  Yes [provider]  aspirin EC 325 MG tablet Take 325 mg by mouth daily as needed (headache).    [provider]  ciprofloxacin (CIPRO) 500 MG tablet Take 1 tablet (500 mg total) by mouth 2 (two) times daily. Patient not taking: Reported on 02/17/2018 11/07/16   Elpidio AnisUpstill, Shari, PA-C  HYDROcodone-acetaminophen (NORCO/VICODIN) 5-325 MG tablet Take 1 tablet by mouth every 4 (four)  hours as needed. Patient not taking: Reported on 02/17/2018 11/07/16   Elpidio Anis, PA-C  metroNIDAZOLE (FLAGYL) 500 MG tablet Take 1 tablet (500 mg total) by mouth 3 (three) times daily. Patient not taking: Reported on 02/17/2018 11/07/16   Elpidio Anis, PA-C     Family History  Problem Relation Age of Onset    Kidney failure Father     Social History   Socioeconomic History   Marital status: Single    Spouse name: Not on file   Number of children: Not on file   Years of education: Not on file   Highest education level: Not on file  Occupational History   Not on file  Social Needs   Financial resource strain: Not on file   Food insecurity    Worry: Not on file    Inability: Not on file   Transportation needs    Medical: Not on file    Non-medical: Not on file  Tobacco Use   Smoking status: Current Every Day Smoker    Packs/day: 0.05   Smokeless tobacco: Never Used  Substance and Sexual Activity   Alcohol use: Yes    Comment: very rarely    Drug use: No   Sexual activity: Not on file  Lifestyle   Physical activity    Days per week: Not on file    Minutes per session: Not on file   Stress: Not on file  Relationships   Social connections    Talks on phone: Not on file    Gets together: Not on file    Attends religious service: Not on file    Active member of club or organization: Not on file    Attends meetings of clubs or organizations: Not on file    Relationship status: Not on file  Other Topics Concern   Not on file  Social History Narrative   Not on file      Review of Systems SEE ABOVE  Vital Signs: BP (!) 183/83 (BP Location: Right Arm)    Pulse 86    Temp 100.3 F (37.9 C) (Oral)    Resp (!) 22    Ht 5\' 5"  (1.651 m)    Wt 293 lb 3.4 oz (133 kg)    LMP 05/27/2019    SpO2 92%    BMI 48.79 kg/m   Physical Exam latest exam today per admitting physician: patient awake, alert.  Chest with diminished breath sounds bilaterally.  Heart with regular rate and rhythm.  Abdomen obese, soft, tenderness over right upper quadrant and lower quadrant, positive bowel sounds; trace lower extremity edema.  Imaging: Ct Angio Chest Pe W Or Wo Contrast  Result Date: 07/16/2019 CLINICAL DATA:  Acute onset of shortness of breath today EXAM: CT ANGIOGRAPHY CHEST  WITH CONTRAST TECHNIQUE: Multidetector CT imaging of the chest was performed using the standard protocol during bolus administration of intravenous contrast. Multiplanar CT image reconstructions and MIPs were obtained to evaluate the vascular anatomy. CONTRAST:  73mL OMNIPAQUE IOHEXOL 350 MG/ML SOLN COMPARISON:  Chest x-ray 07/07/2019 FINDINGS: Cardiovascular: Satisfactory opacification of the pulmonary arteries to the segmental level. No evidence of pulmonary embolism. Normal heart size. Tiny pericardial effusion. Mediastinum/Nodes: No enlarged mediastinal, hilar, or axillary lymph nodes. Thyroid gland, trachea, and esophagus demonstrate no significant findings. Lungs/Pleura: Patient has extensive patchy bilateral pulmonary infiltrates primarily in the upper lobes. Small bilateral pleural effusions, slightly greater on the left. Slight atelectasis in the right middle lobe and in the right lower lobe.  Upper Abdomen: Free air under the right hemidiaphragm. See CT scan of the abdomen report for further information. Musculoskeletal: No chest wall abnormality. No acute or significant osseous findings. Review of the MIP images confirms the above findings. IMPRESSION: 1. No pulmonary emboli. 2. Extensive patchy bilateral pulmonary infiltrates primarily in the upper lobes. 3. Small bilateral pleural effusions, slightly greater on the left. 4. Free air under the right hemidiaphragm. See CT scan of the abdomen report for further information. Electronically Signed   By: Francene Boyers M.D.   On: 07/16/2019 21:25   Ct Abdomen Pelvis W Contrast  Result Date: 07/16/2019 CLINICAL DATA:  Abdominal pain and recurrent fever EXAM: CT ABDOMEN AND PELVIS WITH CONTRAST TECHNIQUE: Multidetector CT imaging of the abdomen and pelvis was performed using the standard protocol following bolus administration of intravenous contrast. CONTRAST:  OMNIPAQUE IOHEXOL 300 MG/ML  SOLN COMPARISON:  07/11/2019 FINDINGS: Lower chest: Bilateral  small effusions are noted which have increased somewhat in the interval from the prior exam. Patchy infiltrate is again identified in the lung bases but has increased somewhat from the prior exams particularly in the upper lobes bilaterally. Hepatobiliary: The liver is within normal limits. The gallbladder is well distended with multiple gallstones within. Pancreas: Unremarkable. No pancreatic ductal dilatation or surrounding inflammatory changes. Spleen: Normal in size without focal abnormality. Adrenals/Urinary Tract: Adrenal glands are within normal limits. Kidneys demonstrate a normal enhancement pattern bilaterally. Delayed images demonstrate normal excretion without obstructive change. The bladder is partially distended. Stomach/Bowel: There again noted changes consistent with diverticular disease throughout the colon. Adjacent to the sigmoid colon however in an area of previous inflammation there is now an air-fluid collection that measures at least 7.8 x 4.3 cm in dimension within air-fluid level within consistent with a pericolonic abscess. Additionally there are findings along the ascending colon suggestive of rupture of the ascending colon with increase in intraperitoneal air. A large air fluid collection is identified in the right mid abdomen which measures at least 13 by 7 cm in dimension best seen on image number 65 of series 2. This lies adjacent to the inflamed ascending colon and is also consistent with a pericolonic abscess. Additionally communicating with this large collection is a third air-fluid collection which lies along the lateral aspect of the right lobe of the liver causing mass effect upon the adjacent liver measuring 15 x 6.2 cm in greatest dimension. The appendix is within normal limits. The small bowel is also unremarkable. The stomach is within normal limits. Vascular/Lymphatic: No significant vascular findings are present. No enlarged abdominal or pelvic lymph nodes. Reproductive:  Uterus and bilateral adnexa are unremarkable. Other: No abdominal wall hernia or abnormality. No abdominopelvic ascites. Musculoskeletal: Degenerative changes of the lumbar spine are seen. IMPRESSION: There are changes consistent with progressive inflammatory change involving the colon primarily within the ascending colon in an area where prior small pericolonic air collections were noted. This results in a large somewhat bilobed air-fluid collection which extends along the lateral aspect of the liver as well as anterior to the ascending colon. It is possible the collection anterior to the ascending colon is dilatation of the ascending colon although felt to be less likely given the overall appearance. A third collection is noted along the course of the sigmoid colon consistent with increasing pericolonic abscess as described. Bilateral pleural effusions which have increased in the interval from the prior exam. Additionally increasing patchy infiltrate is noted throughout both lungs primarily within the bases although now seen  within the upper lobes bilaterally. These results will be called to the ordering clinician or representative by the Radiologist Assistant, and communication documented in the PACS or zVision Dashboard. Electronically Signed   By: Alcide Clever M.D.   On: 07/16/2019 19:49   Ct Abdomen Pelvis W Contrast  Addendum Date: 07/11/2019   ADDENDUM REPORT: 07/11/2019 20:01 ADDENDUM: Critical Value/emergent results were called by telephone at the time of interpretation on 07/11/2019 at 8:01 pm to providerDR. Romie Levee , who verbally acknowledged these results. Electronically Signed   By: Charlett Nose M.D.   On: 07/11/2019 20:01   Result Date: 07/11/2019 CLINICAL DATA:  Abdominal pain EXAM: CT ABDOMEN AND PELVIS WITH CONTRAST TECHNIQUE: Multidetector CT imaging of the abdomen and pelvis was performed using the standard protocol following bolus administration of intravenous contrast. CONTRAST:   OMNIPAQUE IOHEXOL 300 MG/ML SOLN, 30mL OMNIPAQUE IOHEXOL 300 MG/ML SOLN COMPARISON:  07/07/2019 FINDINGS: Lower chest: Trace bilateral pleural effusions. Bibasilar atelectasis. Heart is normal size. Hepatobiliary: Gallstones within the gallbladder. No focal hepatic abnormality. Pancreas: No focal abnormality or ductal dilatation. Spleen: No focal abnormality.  Normal size. Adrenals/Urinary Tract: No adrenal abnormality. No focal renal abnormality. No stones or hydronephrosis. Urinary bladder is unremarkable. Stomach/Bowel: Extensive colonic diverticulosis. Stranding/inflammation around the sigmoid colon has progressed since prior study. There is also abnormal stranding along the anterior aspect of the ascending colon which is new since prior study. Increasing pneumoperitoneum with locules of gas throughout the peritoneum. Is difficult to determine if the source is the sigmoid colon or the ascending colon. Appendix is normal. Stomach and small bowel decompressed. Vascular/Lymphatic: No evidence of aneurysm or adenopathy. Reproductive: Uterus and adnexa unremarkable.  No mass. Other: Small amount of perihepatic ascites. Musculoskeletal: No acute bony abnormality. IMPRESSION: Diffuse colonic diverticulosis. Worsening inflammation around the sigmoid colon with new inflammation along the anterior wall of the ascending colon. There is increasing pneumoperitoneum throughout the abdomen and pelvis along with new perihepatic ascites. Findings compatible with perforation. It is difficult to determine exact source of free air. The majority of the free intraperitoneal air is centered anterior to the ascending colon and liver suggesting the possibility that the ascending colon is the source. Cholelithiasis. Trace bilateral effusions.  Bibasilar atelectasis. Electronically Signed: By: Charlett Nose M.D. On: 07/11/2019 19:32   Ct Abdomen Pelvis W Contrast  Result Date: 07/07/2019 CLINICAL DATA:  Abdominal pain diarrhea  EXAM: CT ABDOMEN AND PELVIS WITH CONTRAST TECHNIQUE: Multidetector CT imaging of the abdomen and pelvis was performed using the standard protocol following bolus administration of intravenous contrast. CONTRAST:  OMNIPAQUE IOHEXOL 300 MG/ML  SOLN COMPARISON:  None. FINDINGS: Lower chest: Lung bases demonstrate no acute consolidation or effusion. The heart size is within normal limits. Hepatobiliary: No focal hepatic abnormality or biliary dilatation. Increased intraluminal density within the gallbladder. Pancreas: Unremarkable. No pancreatic ductal dilatation or surrounding inflammatory changes. Spleen: Normal in size without focal abnormality. Adrenals/Urinary Tract: Adrenal glands are unremarkable. Kidneys are normal, without renal calculi, focal lesion, or hydronephrosis. Bladder is unremarkable. Stomach/Bowel: The stomach is nonenlarged. No dilated small bowel. Nondilated appendix. Hyperdense focus at the cecum near the origin of the appendix, possible diverticulum versus small appendicoliths but no evidence for appendiceal inflammatory change. Fluid within the colon. Sigmoid colon diverticular disease with wall thickening and inflammatory change consistent with acute diverticulitis. Multiple foci of extraluminal gas at the inflamed sigmoid colon. Vascular/Lymphatic: Nonaneurysmal aorta. No significantly enlarged lymph nodes. Reproductive: Uterus and bilateral adnexa are unremarkable. Other: Small foci  of extraluminal gas within the right lower quadrant adjacent to the inflamed sigmoid colon. Tiny foci of gas within the right upper quadrant and overlying the dome of the liver. Musculoskeletal: No acute or suspicious osseous abnormality. There are degenerative changes. IMPRESSION: 1. Focal inflammatory changes and wall thickening involving the distal sigmoid colon consistent with acute diverticulitis. Multiple small foci of gas adjacent to the inflamed sigmoid colon with small foci of free air adjacent to  the liver, consistent with perforation. Negative for organized abscess at this time. 2. Increased intraluminal density within the gallbladder, possible stones or sludge. Critical Value/emergent results were called by telephone at the time of interpretation on 07/07/2019 at 11:12 pm to providerBOWIE TRAN , who verbally acknowledged these results. Electronically Signed   By: Jasmine Pang M.D.   On: 07/07/2019 23:12   Dg Chest Portable 1 View  Result Date: 07/07/2019 CLINICAL DATA:  Fever, recent COVID exposure. EXAM: PORTABLE CHEST 1 VIEW COMPARISON:  04/24/2013 FINDINGS: The heart size and mediastinal contours are within normal limits. Both lungs are clear. The visualized skeletal structures are unremarkable. IMPRESSION: No active disease. Electronically Signed   By: Charlett Nose M.D.   On: 07/07/2019 19:45   [place] Ekg Site Rite  Result Date: 07/17/2019 If Site Rite image not attached, placement could not be confirmed due to current cardiac rhythm.   Labs:  CBC: Recent Labs    07/14/19 0602 07/15/19 0545 07/16/19 0548 07/17/19 0615  WBC 15.1* 13.3* 11.5* 16.7*  HGB 11.4* 11.5* 12.2 11.0*  HCT 37.1 37.3 40.1 35.1*  PLT 476* 460* 446* 366    COAGS: No results for input(s): INR, APTT in the last 8760 hours.  BMP: Recent Labs    07/13/19 0616 07/14/19 0602 07/16/19 1433 07/17/19 1034  NA 135 139 135 135  K 3.2* 3.7 3.9 3.6  CL 106 109 105 104  CO2 23 23 20* 22  GLUCOSE 98 92 93 83  BUN CALCIUM 7.7* 8.0* 8.0* 7.7*  CREATININE 0.83 0.74 0.69 0.72  GFRNONAA >60 >60 >60 >60  GFRAA >60 >60 >60 >60    LIVER FUNCTION TESTS: Recent Labs    07/07/19 2057 07/11/19 0914 07/16/19 1433 07/17/19 1034  BILITOT 1.6* 1.0 0.4 0.7  AST 22 11* 22 23  ALT ALKPHOS 73 75 94 87  PROT 7.9 6.2* 6.6 6.8  ALBUMIN 4.1 3.0* 2.3* 2.4*    TUMOR MARKERS: No results for input(s): AFPTM, CEA, CA199, CHROMGRNA in the last 8760 hours.  Assessment and Plan: 48 y.o.  female with history of hypertension, irritable bowel syndrome, fibromyalgia, prior PE, morbid obesity, and currently COVID-19 positive who presented to  [facility] 07/07/19 with lower abdominal pain as well as intermittent fevers.  Subsequent imaging revealed :  There are changes consistent with progressive inflammatory change involving the colon primarily within the ascending colon in an area where prior small pericolonic air collections were noted. This results in a large somewhat bilobed air-fluid collection which extends along the lateral aspect of the liver as well as anterior to the ascending colon. It is possible the collection anterior to the ascending colon is dilatation of the ascending colon although felt to be less likely given the overall appearance. A third collection is noted along the course of the sigmoid colon consistent with increasing pericolonic abscess as described.  Bilateral pleural effusions which have increased in the interval from the prior exam. Additionally increasing patchy infiltrate  is noted throughout both lungs primarily within the bases although now seen within the upper lobes bilaterally  Last temp 100.3.  Creatinine 0.72, WBC 16.7, hemoglobin 11, platelets 366K; PT 18, INR 1.5.  Request now received from surgery for image guided drainage of abdominal fluid collections.  Imaging studies have been reviewed by Dr. Kathlene Cote.Risks and benefits discussed with the patient including bleeding, infection, damage to adjacent structures, bowel perforation/fistula connection, and sepsis.  All of the patient's questions were answered, patient is agreeable to proceed. Consent signed and in chart.  Patient prefers to have drain procedure performed on 11/25.  Surgery notified.   Thank you for this interesting consult.  I greatly enjoyed meeting Holly Moon and look forward to participating in their care.  A copy of this report was sent to the  requesting provider on this date.  Electronically Signed: D. Rowe Robert, PA-C 07/17/2019, 4:28 PM

## 2019-07-17 NOTE — Progress Notes (Signed)
   07/17/19 1000  Clinical Encounter Type  Visited With Patient  Visit Type Initial;Psychological support;Spiritual support  Referral From Nurse  Consult/Referral To Chaplain  Spiritual Encounters  Spiritual Needs Prayer;Emotional;Other (Comment) (Spiritual Care Conversation/Support)  Stress Factors  Patient Stress Factors Health changes;Lack of knowledge;Other (Comment)   I spoke with Mahum per spiritual care consult. When I talked with her this morning she stated that she "wasn't doing great today." Margurette states that she is "confused" about when her surgical procedure will be and is anxious to get her covid test results back. Wilmoth is anxious that her husband cannot be up here to see her and support her in person, and requests that I visit with her.   I will continue to follow up with Ayslin and provide support.   Chaplain Shanon Ace M.Div., [facility]

## 2019-07-17 NOTE — Progress Notes (Signed)
Peripherally Inserted Central Catheter/Midline Placement  The IV Nurse has discussed with the patient and/or persons authorized to consent for the patient, the purpose of this procedure and the potential benefits and risks involved with this procedure.  The benefits include less needle sticks, lab draws from the catheter, and the patient may be discharged home with the catheter. Risks include, but not limited to, infection, bleeding, blood clot (thrombus formation), and puncture of an artery; nerve damage and irregular heartbeat and possibility to perform a PICC exchange if needed/ordered by physician.  Alternatives to this procedure were also discussed.  Bard Power PICC patient education guide, fact sheet on infection prevention and patient information card has been provided to patient /or left at bedside.    PICC/Midline Placement Documentation  PICC Double Lumen 02/54/27 PICC Right Basilic 38 cm 0 cm (Active)  Indication for Insertion or Continuance of Line Prolonged intravenous therapies 07/17/19 1426  Exposed Catheter (cm) 0 cm 07/17/19 1426  Site Assessment Clean;Dry;Intact 07/17/19 1426  Lumen #1 Status Flushed;Blood return noted;Saline locked 07/17/19 1426  Lumen #2 Status Flushed;Blood return noted;Saline locked 07/17/19 1426  Dressing Type Transparent 07/17/19 1426  Dressing Status Clean;Dry;Intact;Antimicrobial disc in place 07/17/19 1426  Dressing Change Due 07/24/19 07/17/19 1426       Scotty Court 07/17/2019, 2:28 PM

## 2019-07-17 NOTE — Progress Notes (Signed)
Patient's husband has been updated of the patients new room.

## 2019-07-17 NOTE — Progress Notes (Signed)
Patient called RN to the room due to labored breathing. SpO2 89% on room air. Temp 100.2. Rapid Response at bedside. Patient placed on nasal canula 3l, spo2 97%. Patient requesting mask to retreive oxygen. MD paged.

## 2019-07-17 NOTE — Progress Notes (Addendum)
CRITICAL VALUE ALERT  Critical Value:  COVID (+)  Date & Time Notied: 07/17/19 1515  Provider Notified: [facility]/  Transferred to [facility], on hold for 10 mins call disconnected. Unable to report result. Will report to primary RN.  Orders Received/Actions taken: N/A

## 2019-07-17 NOTE — Progress Notes (Signed)
CC: Abdominal pain  Subjective: Patient still has some pain on her right side.  She is pretty anxious over everything.  We just put up the Covid precautions and she is getting retested for Covid.  We explained she has some changes in her lung the CT scan and Medicine consult is pending.  We have also requested an IR consult explained that they will look at her CT and see if they can put a drain in the right sided air-fluid collection.   Objective: Vital signs in last 24 hours: Temp:  [98.3 F (36.8 C)-103 F (39.4 C)] 98.6 F (37 C) (11/24 0418) Pulse Rate:  [72-99] 73 (11/24 0418) Resp:  [16-40] 16 (11/24 0418) BP: (110-187)/(51-143) 110/69 (11/24 0418) SpO2:  [89 %-100 %] 95 % (11/24 0418) Last BM Date: 07/15/19 420 p.o. Voided x4 No BM recorded T-max 103 at 11 AM.  Low-grade fevers 1900/ 2300 hrs: 100.4/100.7 respectively. Labs show CMP is stable. Prealbumin is less than 5 WBC 16.7 Hemoglobin 11/hematocrit 35. Repeat CT 11/23: Diverticular disease with 7.8 x 4.3 cm air-fluid collection; ascending colon air/fluid collection 13 x 7 cm also consistent with possible pericolonic abscess, a third collection lateral aspect the right lobe of the liver measuring 15 x 6.2 causing mass-effect upon the liver.  -CT also showed bilateral pleural effusions which have increased since the last CT.  Additional patchy infiltrate noted throughout both lungs primarily within the bases although now seen in the upper lobes bilaterally.  -CT of the chest 11/23: No pulmonary emboli, extensive patchy bilateral pulmonary infiltrates primarily in the upper lobes.  Small bilateral pleural effusions left greater than right free air under the right hemidiaphragm Intake/Output from previous day: 11/23 0701 - 11/24 0700 In: 420 [P.O.:420] Out: 0  Intake/Output this shift: No intake/output data recorded.  Patient's alert, anxious but in no distress. Resp: Some anterior wheezing.  I could not do  auscultation; no stethoscope in the room. GI: Her abdomen soft she still some soreness on the left.  Lab Results:  Recent Labs    07/16/19 0548 07/17/19 0615  WBC 11.5* 16.7*  HGB 12.2 11.0*  HCT 40.1 35.1*  PLT 446* 366    BMET Recent Labs    07/16/19 1433  NA 135  K 3.9  CL 105  CO2 20*  GLUCOSE 93  BUN 7  CREATININE 0.69  CALCIUM 8.0*   PT/INR No results for input(s): LABPROT, INR in the last 72 hours.  Recent Labs  Lab 07/11/19 0914 07/16/19 1433  AST 11* 22  ALT 13 15  ALKPHOS 75 94  BILITOT 1.0 0.4  PROT 6.2* 6.6  ALBUMIN 3.0* 2.3*     Lipase     Component Value Date/Time   LIPASE 17 07/07/2019 2057     Medications: . acetaminophen  1,000 mg Oral Q6H  . benazepril  20 mg Oral Daily  . busPIRone  30 mg Oral BID  . desvenlafaxine  50 mg Oral Daily  . enoxaparin (LOVENOX) injection  60 mg Subcutaneous Q24H  . feeding supplement (PRO-STAT SUGAR FREE 64)  30 mL Oral BID  . gabapentin  300 mg Oral TID  . multivitamin with minerals  1 tablet Oral Daily  . pneumococcal 23 valent vaccine  0.5 mL Intramuscular Tomorrow-1000  . potassium chloride  20 mEq Oral BID  . Ensure Max Protein  11 oz Oral BID  . tiZANidine  4 mg Oral QHS  . topiramate  100 mg Oral Daily  Assessment/Plan Hx IBS/questionable Crohn's disease AKI Hypertension Fibromyalgia-generalized Back,hip,and leg pain Hx PE-off anticoagulants Tobacco use EtOH use Morbid obesity-BMI 48.79 Extensive bilateral patchy pulmonary infiltrates primarly upper lobes; small bilateral effusions  - Medicine consult   Acute sigmoid diverticulitis -CT 07/11/2019: Diffuse colonic diverticulosis, worsening inflammation around the sigmoid colon with new inflammation along the anterior wall of the ascending colon increased pneumoperitoneum throughout the abdomen pelvis along with perihepatic ascites.   -Fever 11/21 and 11/23  -Repeat CT 11/23: Diverticular disease with 7.8 x 4.3 cm air-fluid  collection; ascending colon air/fluid collection 13 x 7 cm also consistent with possible pericolonic abscess, a third collection lateral aspect the right lobe of the liver measuring 15 x 6.2 causing mass-effect upon the liver.  -CT also showed bilateral pleural effusions which have increased since the last CT.  Additional patchy infiltrate noted throughout both lungs primarily within the bases although now seen in the upper lobes bilaterally.  -CT of the chest 11/23: No pulmonary emboli, extensive patchy bilateral pulmonary infiltrates primarily in the upper lobes.  Small bilateral pleural effusions left greater than right free air under the right hemidiaphragm  FEN: IV fluids/clear liquids>>advance to soft diet ID: Zosyn 9/14>>day11 DVT: SCDs only;Lovenox Follow-up: TBD   Plan:Medicine consult for pulmonary infiltrates both lungs.  She reports her last exposure to her sister was on the weekend of November 7.  Requested IR to evaluate for possible drain placement.  Continue IV antibiotics.  LOS: 9 days    Houa Ackert 07/17/2019 Please see Amion

## 2019-07-17 NOTE — Progress Notes (Signed)
Rapid Response Event Note  Overview: called to room for labored breathing and pending covid test   Initial Focused Assessment: When arrived in room, patient on 3L [place] 97%, respirations regular and unlabored. patient tearful and asking "am I going to die". Emotional support provided to patient. Patient continued to state she needs more oxygen and that she is a "mouth breather". Placed patient on 15L NR[facility] per her request. Patient remained on 15L for about 10 minutes and transferred to room air, continued to sat 99% on RA. HR SR 80's.    Interventions: previous transfer order to [facility]- 1429 bed ready and assisted with transfer.

## 2019-07-17 NOTE — Consult Note (Addendum)
[facility] Medical Consultation  Holly Moon JJK:[id number] DOB: Jul 06, 1971 DOA: 07/07/2019 PCP: Harrison Mons, PA   Requesting physician: Leighton Ruff  Date of consultation: 07/17/2019   Reason for consultation: Fever, lung infiltrates  Impression/Recommendations Active Problems:   Diverticulitis  Intermittent fever , new pulmonary bilateral upper lobe infiltrate with effusion.  Persistent and uptrending leukocytosis.  History of Covid in the family on the weekend of November 7.  This could be healthcare associated pneumonia versus developing Covid pneumonia.  Will put the patient on isolation precautions for now.  Check for COVID-19 PCR.  Check procalcitonin.  Check inflammatory biomarkers.  Add iv vancomycin for possible healthcare associated pneumonia..  Send blood for cultures x2.  Continue respiratory support as needed. Could consider IV Lasix by tomorrow once patient is off n.p.o.  Complicated acute sigmoid diverticulitis with developing fluid collection and abscesses.  Abdominal CT scan on 07/11/2019 showed diffuse colonic diverticulosis with worsening inflammation around the sigmoid colon with increased pneumoperitoneum and perihepatic ascites.  Repeat CT scan of the abdomen was performed 07/16/2019 with possible abscesses.  Patient is on IV Zosyn. IR has been consulted for image guided drainage.    Management as per primary team.   Morbid obesity.  Lifestyle modification to be discussed  Essential hypertension.  Continue  antihypertensive medication with benazepril.  History of fibromyalgia.  Continue Topamax Zanaflex narcotics.  Currently relatively controlled.  History of pulmonary embolism off anticoagulants.  CT angiogram of the chest done 1123 showed no evidence of pulmonary volume but pneumonia.  Continue Lovenox  History of alcohol/tobacco abuse.  Patient states that she has not smoked in last 10 days.  No signs of withdrawal noted  DVT prophylaxis on  Lovenox.  [facility] team will followup again tomorrow. Please contact me if I can be of assistance in the meanwhile. Thank you for this consultation.  Chief Complaint: Fever, lung infiltrates  HPI:  Patient is a 48 years old female with past medical history of essential hypertension, irritable bowel syndrome, morbid obesity, hypertension, fibromyalgia, history of pulmonary embolism not on anticoagulation currently presented to hospital 10 days back with complaints of abdominal pain in the lower abdomen which was described as crampy in nature.  Patient was admitted to hospital for acute diverticulitis.  Patient did have a history of diverticulitis in the past and was managed by antibiotics.  There was some concern for Crohn's disease but this has not been confirmed.  Has been receiving Zosyn IV for acute diverticulitis with pneumoperitoneum.  On 07/16/2019 patient had a rapid response due to change in her status.  She was tachypneic with a fever of 103 fever and tachycardia was noted.  She did have a CT angiogram scan of the chest which did not show evidence of pulmonary embolism but extensive patchy bilateral pulmonary infiltrates primarily in the upper lobe with a small bilateral pleural effusions left more than the right..  A CT scan of the abdomen on 11/23 showed multiple abdominal fluid collections-suggestive of possible abscess with free air under the diaphragm. Today patient complains of pain over the right upper quadrant.  Patient stated she had had cough with some productive sputum production recently and including yesterday.  She did see some pink frothy and dark phlegm production.  Patient denies any urinary urgency, frequency or dysuria.  Patient has been having loose watery stools since that she has been mostly on liquids.  Patient denies dizziness, lightheadedness or syncope.  Patient denies any chest pain, palpitation or increasing shortness  of breath.    IR has been consulted for drainage of the  right upper quadrant abscess.  Review of Systems:  All systems were reviewed and were negative except as in the history of presenting illness  Past Medical History:  Diagnosis Date  . Hypertension 2015  . IBS (irritable bowel syndrome)   . Pulmonary embolism [facility])    Past Surgical History:  Procedure Laterality Date  . FRACTURE SURGERY Left 1987   leg fracture  . RIGHT CARPAL    . WRIST FRACTURE SURGERY     Social History:  reports that she has been smoking. She has been smoking about 0.05 packs per day. She has never used smokeless tobacco. She reports current alcohol use. She reports that she does not use drugs.  No Known Allergies Family History  Problem Relation Age of Onset  . Kidney failure Father     Prior to Admission medications   Medication Sig Start Date End Date Taking? Authorizing Provider  ALPRAZolam Holly Moon) 0.5 MG tablet Take 0.5 mg by mouth 2 (two) times daily as needed. 06/30/19  Yes [provider]  aspirin EC 81 MG tablet Take 81 mg by mouth daily.   Yes [provider]  benazepril (LOTENSIN) 20 MG tablet Take 20 mg by mouth daily.   Yes [provider]  busPIRone (BUSPAR) 30 MG tablet Take 1 tablet by mouth 2 (two) times daily. 06/29/19  Yes [provider]  desvenlafaxine (PRISTIQ) 50 MG 24 hr tablet Take 50 mg by mouth daily. 07/04/19  Yes [provider]  gabapentin (NEURONTIN) 300 MG capsule  01/25/18  Yes [provider]  naproxen (NAPROSYN) 500 MG tablet  02/04/18  Yes [provider]  tiZANidine (ZANAFLEX) 4 MG tablet  02/04/18  Yes [provider]  topiramate (TOPAMAX) 100 MG tablet Take 1 tablet by mouth daily. 06/29/19  Yes [provider]  venlafaxine XR (EFFEXOR-XR) 150 MG 24 hr capsule Take 1 capsule by mouth daily. 06/29/19  Yes [provider]  aspirin EC 325 MG tablet Take 325 mg by mouth daily as needed (headache).    [provider]  ciprofloxacin  (CIPRO) 500 MG tablet Take 1 tablet (500 mg total) by mouth 2 (two) times daily. Patient not taking: Reported on 02/17/2018 11/07/16   Elpidio Anis, PA-C  HYDROcodone-acetaminophen (NORCO/VICODIN) 5-325 MG tablet Take 1 tablet by mouth every 4 (four) hours as needed. Patient not taking: Reported on 02/17/2018 11/07/16   Elpidio Anis, PA-C  metroNIDAZOLE (FLAGYL) 500 MG tablet Take 1 tablet (500 mg total) by mouth 3 (three) times daily. Patient not taking: Reported on 02/17/2018 11/07/16   Elpidio Anis, PA-C    Physical Exam: Blood pressure 110/69, pulse 73, temperature 98.6 F (37 C), resp. rate 16, height 5\' 5"  (1.651 m), weight 133 kg, last menstrual period 05/27/2019, SpO2 95 %. Vitals:   07/17/19 0037 07/17/19 0418  BP: (!) 122/51 110/69  Pulse: 72 73  Resp: 19 16  Temp: (!) 100.7 F (38.2 C) 98.6 F (37 C)  SpO2: 94% 95%   Body mass index is 48.79 kg/m.   Intake/Output Summary (Last 24 hours) at 07/17/2019 0851 Last data filed at 07/16/2019 1731 Gross per 24 hour  Intake 420 ml  Output 0 ml  Net 420 ml     General: Morbidly obese, not in obvious distress, saturating 95% on room air HENT: Normocephalic, pupils equally reacting to light and accommodation.  No scleral pallor or icterus noted. Oral mucosa  is moist.  Chest: Diminished breath sounds noted bilaterally.  CVS: S1 &S2 heard. No murmur.  Regular rate and rhythm. Abdomen: Soft, tenderness over the right upper quadrant and lower quadrant...  Bowel sounds are heard. Extremities: No cyanosis, clubbing but trace edema.  Peripheral pulses are palpable. Psych: Alert, awake and oriented, normal mood CNS:  No cranial nerve deficits.  Power equal in all extremities.  No sensory deficits noted.  No cerebellar signs.   Skin: Warm and dry.  No rashes noted.  Laboratory Data:  CBC: Recent Labs  Lab 07/13/19 0616 07/14/19 0602 07/15/19 0545 07/16/19 0548 07/17/19 0615  WBC 15.0* 15.1* 13.3* 11.5* 16.7*  HGB 12.7 11.4*  11.5* 12.2 11.0*  HCT 41.2 37.1 37.3 40.1 35.1*  MCV 90.9 90.9 90.3 90.9 88.4  PLT 454* 476* 460* 446* 366    Basic Metabolic Panel: Recent Labs  Lab 07/11/19 0914 07/12/19 0606 07/13/19 0616 07/14/19 0602 07/16/19 1433  NA 135 135 135 139 135  K 3.1* 3.6 3.2* 3.7 3.9  CL 108 104 106 109 105  CO2 18* 21* 23 23 20*  GLUCOSE 105* 110* 98 92 93  BUN CREATININE 0.91 0.95 0.83 0.74 0.69  CALCIUM 7.9* 7.6* 7.7* 8.0* 8.0*  MG 2.0  --   --   --   --     Liver Function Tests: Recent Labs  Lab 07/11/19 0914 07/16/19 1433  AST 11* 22  ALT 13 15  ALKPHOS 75 94  BILITOT 1.0 0.4  PROT 6.2* 6.6  ALBUMIN 3.0* 2.3*   No results for input(s): LIPASE, AMYLASE in the last 168 hours. No results for input(s): AMMONIA in the last 168 hours.  Cardiac Enzymes: No results for input(s): CKTOTAL, CKMB, CKMBINDEX, TROPONINI in the last 168 hours.  BNP: Invalid input(s): POCBNP  CBG: No results for input(s): GLUCAP in the last 168 hours.  Radiological Exams on Admission: Ct Angio Chest Pe W Or Wo Contrast  Result Date: 07/16/2019 CLINICAL DATA:  Acute onset of shortness of breath today EXAM: CT ANGIOGRAPHY CHEST WITH CONTRAST TECHNIQUE: Multidetector CT imaging of the chest was performed using the standard protocol during bolus administration of intravenous contrast. Multiplanar CT image reconstructions and MIPs were obtained to evaluate the vascular anatomy. CONTRAST:  80mL OMNIPAQUE IOHEXOL 350 MG/ML SOLN COMPARISON:  Chest x-ray 07/07/2019 FINDINGS: Cardiovascular: Satisfactory opacification of the pulmonary arteries to the segmental level. No evidence of pulmonary embolism. Normal heart size. Tiny pericardial effusion. Mediastinum/Nodes: No enlarged mediastinal, hilar, or axillary lymph nodes. Thyroid gland, trachea, and esophagus demonstrate no significant findings. Lungs/Pleura: Patient has extensive patchy bilateral pulmonary infiltrates primarily in the upper lobes. Small  bilateral pleural effusions, slightly greater on the left. Slight atelectasis in the right middle lobe and in the right lower lobe. Upper Abdomen: Free air under the right hemidiaphragm. See CT scan of the abdomen report for further information. Musculoskeletal: No chest wall abnormality. No acute or significant osseous findings. Review of the MIP images confirms the above findings. IMPRESSION: 1. No pulmonary emboli. 2. Extensive patchy bilateral pulmonary infiltrates primarily in the upper lobes. 3. Small bilateral pleural effusions, slightly greater on the left. 4. Free air under the right hemidiaphragm. See CT scan of the abdomen report for further information. Electronically Signed   By: Francene Boyers M.D.   On: 07/16/2019 21:25   Ct Abdomen Pelvis W Contrast  Result Date: 07/16/2019 CLINICAL DATA:  Abdominal pain and recurrent fever EXAM: CT ABDOMEN AND  PELVIS WITH CONTRAST TECHNIQUE: Multidetector CT imaging of the abdomen and pelvis was performed using the standard protocol following bolus administration of intravenous contrast. CONTRAST:  100mL OMNIPAQUE IOHEXOL 300 MG/ML  SOLN COMPARISON:  07/11/2019 FINDINGS: Lower chest: Bilateral small effusions are noted which have increased somewhat in the interval from the prior exam. Patchy infiltrate is again identified in the lung bases but has increased somewhat from the prior exams particularly in the upper lobes bilaterally. Hepatobiliary: The liver is within normal limits. The gallbladder is well distended with multiple gallstones within. Pancreas: Unremarkable. No pancreatic ductal dilatation or surrounding inflammatory changes. Spleen: Normal in size without focal abnormality. Adrenals/Urinary Tract: Adrenal glands are within normal limits. Kidneys demonstrate a normal enhancement pattern bilaterally. Delayed images demonstrate normal excretion without obstructive change. The bladder is partially distended. Stomach/Bowel: There again noted changes  consistent with diverticular disease throughout the colon. Adjacent to the sigmoid colon however in an area of previous inflammation there is now an air-fluid collection that measures at least 7.8 x 4.3 cm in dimension within air-fluid level within consistent with a pericolonic abscess. Additionally there are findings along the ascending colon suggestive of rupture of the ascending colon with increase in intraperitoneal air. A large air fluid collection is identified in the right mid abdomen which measures at least 13 by 7 cm in dimension best seen on image number 65 of series 2. This lies adjacent to the inflamed ascending colon and is also consistent with a pericolonic abscess. Additionally communicating with this large collection is a third air-fluid collection which lies along the lateral aspect of the right lobe of the liver causing mass effect upon the adjacent liver measuring 15 x 6.2 cm in greatest dimension. The appendix is within normal limits. The small bowel is also unremarkable. The stomach is within normal limits. Vascular/Lymphatic: No significant vascular findings are present. No enlarged abdominal or pelvic lymph nodes. Reproductive: Uterus and bilateral adnexa are unremarkable. Other: No abdominal wall hernia or abnormality. No abdominopelvic ascites. Musculoskeletal: Degenerative changes of the lumbar spine are seen. IMPRESSION: There are changes consistent with progressive inflammatory change involving the colon primarily within the ascending colon in an area where prior small pericolonic air collections were noted. This results in a large somewhat bilobed air-fluid collection which extends along the lateral aspect of the liver as well as anterior to the ascending colon. It is possible the collection anterior to the ascending colon is dilatation of the ascending colon although felt to be less likely given the overall appearance. A third collection is noted along the course of the sigmoid colon  consistent with increasing pericolonic abscess as described. Bilateral pleural effusions which have increased in the interval from the prior exam. Additionally increasing patchy infiltrate is noted throughout both lungs primarily within the bases although now seen within the upper lobes bilaterally. These results will be called to the ordering clinician or representative by the Radiologist Assistant, and communication documented in the PACS or zVision Dashboard. Electronically Signed   By: Alcide CleverMark  Lukens M.D.   On: 07/16/2019 19:49    EKG: Not available for review   Huma Imhoff [facility] 07/17/2019

## 2019-07-17 NOTE — Significant Event (Signed)
Covid PCR positive at this time.  Did require supplemental oxygen and is hypoxic on room air.  Reviewed inflammatory biomarkers with elevated CRP at 30 and D-dimer at 9.1.Marland Kitchen  Ferritin 220, LDH 179.  At this time, patient has a elevated procalcitonin and ongoing infection in her abdomen which could complicate the inflammatory markers.  Despite that, patient does have fever new respiratory symptoms and classic Covid pattern pneumonia.  I would start the patient on dexamethasone IV starting today.  Patient does have normal liver function test.  Will consult pharmacy to see if patient would benefit from remdesivir.  Since the patient will be on dexamethasone will need to closely monitor for hypoglycemia and worsening infection.

## 2019-07-17 NOTE — Progress Notes (Signed)
Nutrition Follow-up  DOCUMENTATION CODES:   Morbid obesity  INTERVENTION:   -TPN per Pharmacy -Will order supplements once diet is advanced  NUTRITION DIAGNOSIS:   Inadequate oral intake related to other (see comment)(current diet order) as evidenced by other (comment)(FLD does not meet estimated needs.).  Ongoing. Now NPO.  GOAL:   Patient will meet greater than or equal to 90% of their needs  Not meeting.  MONITOR:   PO intake, Supplement acceptance, Diet advancement, Labs, Weight trends  REASON FOR ASSESSMENT:   Consult New TPN/TNA, Assessment of nutrition requirement/status  ASSESSMENT:   48 y.o. female with hx of HTN, IBS, and diverticulitis. She presented to the ED on 11/14 due to abdominal pain which began on 11/13 after eating pizza for dinner. She experienced N/V x1 PTA. She has family members, including her sister, who tested positive for COVID-19. She reported that Rheumatologist told her she may have Crohn's disease, but it is unclear what this is based on.  11/14: admitted w/ abdominal pain  Patient with ever changing diet since admission. Currently NPO. Surgery plans to start TPN.  Pt was started on supplements when on a full liquid diet on 11/20. Pt was drinking Ensure Max. Diet was advanced to soft diet on 11/23 but changed later to clears given worsening SOB. CT scan revealed small bilateral pleural effusions. Pt now being re-tested for COVID-19 and moved to 4[facility]. Results still pending. IR consulted for possible drain placement for abscess.  TPN has been ordered for 40 ml/hr (providing 990 kcals, 52g protein). Just had PICC placed.  No new weights have been measured since 11/14.  Labs reviewed. Medications: IV Lasix, KLOR-CON tablet,   Diet Order:   Diet Order            Diet NPO time specified  Diet effective midnight              EDUCATION NEEDS:   Not appropriate for education at this time  Skin:  Skin Assessment: Reviewed RN  Assessment  Last BM:  11/24  Height:   Ht Readings from Last 1 Encounters:  07/17/19 5\' 5"  (1.651 m)    Weight:   Wt Readings from Last 1 Encounters:  07/07/19 133 kg    Ideal Body Weight:  56.8 kg  BMI:  Body mass index is 48.79 kg/m.  Estimated Nutritional Needs:   Kcal:  2536-6440 kcal  Protein:  100-113 grams  Fluid:  >/= 2 L/day  Clayton Bibles, MS, RD, LDN Inpatient Clinical Dietitian Pager: [phone] After Hours Pager: [phone]

## 2019-07-17 NOTE — Progress Notes (Signed)
PHARMACY - ADULT TOTAL PARENTERAL NUTRITION CONSULT NOTE   Pharmacy Consult for TPN Indication:  Intra-abd infection  HPI:  52 yoF admit 11/14 with abd pain, acute sigmoid diverticulitis with pneumoperitoneum, ascites  TPN Access: plan PICC 11/24 TPN start date:    Patient Measurements: Estimated body mass index is 48.79 kg/m as calculated from the following:   Height as of this encounter: 5\' 5"  (1.651 m).   Weight as of this encounter: 293 lb 3.4 oz (133 kg).  Intake/Output:    Estimated Nutritional Needs - Per RD recommendations on  kCal: Protein:  Fluid:  Rx initial calc: 118 gm protein, 2230 kCal per day at goal rate 90 ml/hr  Current Nutrition: NPO  IVF: NS with 20 mEq KCl per L at 75 ml/hr  Insulin Requirements:    Recent Labs    07/16/19 1433 07/17/19 0615  NA 135  --   K 3.9  --   CL 105  --   CO2 20*  --   GLUCOSE 93  --   BUN 7  --   CREATININE 0.69  --   CALCIUM 8.0*  --   ALBUMIN 2.3*  --   ALKPHOS 94  --   AST 22  --   ALT 15  --   BILITOT 0.4  --   PREALBUMIN  --  <5*   ASSESSMENT                                                                                                           Glucose - wnl, no hx DM  Electrolytes - wnl, Corr Ca ~ 9  Renal - wnl  LFTs -  wnl  TGs - will order  Prealbumin - < 5 (11/24)   PLAN                                                                                                                         At 1800 today:  Start/continue custom TPN at 40 ml/hr.  TPN to contain standard multivitamins and trace elements.  Reduce IVF to 60 ml/hr at 1800  Add q6h sensitive scale Novolog coverage Plan to advance TPN as tolerated to the goal rate. TPN lab panels on Mondays & Thursdays. F/u daily.   Minda Ditto  PharmD  07/17/2019 9:57 AM

## 2019-07-17 NOTE — Progress Notes (Signed)
IR has evaluated and planned to try and place a drain today, but patient has ask to wait until tomorrow.  Dr. Kathlene Cote reviewed the CT and thinks it is classic for COVID and recommended treating it as COVID even if the PCR is negative.  We are placing a PICC line today also and starting TPN for Severe Malnutrition.  I have also ask Nutrition to see and they are following for the malnutrition also.    PCR is still pending.

## 2019-07-18 ENCOUNTER — Inpatient Hospital Stay (HOSPITAL_COMMUNITY): Payer: BC Managed Care – PPO

## 2019-07-18 DIAGNOSIS — J9601 Acute respiratory failure with hypoxia: Secondary | ICD-10-CM

## 2019-07-18 DIAGNOSIS — U071 COVID-19: Secondary | ICD-10-CM

## 2019-07-18 DIAGNOSIS — K651 Peritoneal abscess: Secondary | ICD-10-CM

## 2019-07-18 DIAGNOSIS — J069 Acute upper respiratory infection, unspecified: Secondary | ICD-10-CM

## 2019-07-18 DIAGNOSIS — K5720 Diverticulitis of large intestine with perforation and abscess without bleeding: Principal | ICD-10-CM

## 2019-07-18 LAB — CBC WITH DIFFERENTIAL/PLATELET
Abs Immature Granulocytes: 0.21 K/uL — ABNORMAL HIGH (ref 0.00–0.07)
Basophils Absolute: 0 K/uL (ref 0.0–0.1)
Basophils Relative: 0 %
Eosinophils Absolute: 0 K/uL (ref 0.0–0.5)
Eosinophils Relative: 0 %
HCT: 32.8 % — ABNORMAL LOW (ref 36.0–46.0)
Hemoglobin: 10.6 g/dL — ABNORMAL LOW (ref 12.0–15.0)
Immature Granulocytes: 2 %
Lymphocytes Relative: 6 %
Lymphs Abs: 0.7 K/uL (ref 0.7–4.0)
MCH: 27.8 pg (ref 26.0–34.0)
MCHC: 32.3 g/dL (ref 30.0–36.0)
MCV: 86.1 fL (ref 80.0–100.0)
Monocytes Absolute: 0.3 K/uL (ref 0.1–1.0)
Monocytes Relative: 3 %
Neutro Abs: 11 K/uL — ABNORMAL HIGH (ref 1.7–7.7)
Neutrophils Relative %: 89 %
Platelets: 372 K/uL (ref 150–400)
RBC: 3.81 MIL/uL — ABNORMAL LOW (ref 3.87–5.11)
RDW: 15.6 % — ABNORMAL HIGH (ref 11.5–15.5)
WBC: 12.2 K/uL — ABNORMAL HIGH (ref 4.0–10.5)
nRBC: 0 % (ref 0.0–0.2)

## 2019-07-18 LAB — TRIGLYCERIDES: Triglycerides: 185 mg/dL — ABNORMAL HIGH (ref <150)

## 2019-07-18 LAB — D-DIMER, QUANTITATIVE: D-Dimer, Quant: 6.41 ug/mL-FEU — ABNORMAL HIGH (ref 0.00–0.50)

## 2019-07-18 LAB — COMPREHENSIVE METABOLIC PANEL WITH GFR
ALT: 16 U/L (ref 0–44)
AST: 30 U/L (ref 15–41)
Albumin: 2.2 g/dL — ABNORMAL LOW (ref 3.5–5.0)
Alkaline Phosphatase: 79 U/L (ref 38–126)
Anion gap: 7 (ref 5–15)
BUN: 14 mg/dL (ref 6–20)
CO2: 24 mmol/L (ref 22–32)
Calcium: 7.6 mg/dL — ABNORMAL LOW (ref 8.9–10.3)
Chloride: 105 mmol/L (ref 98–111)
Creatinine, Ser: 0.58 mg/dL (ref 0.44–1.00)
GFR calc Af Amer: >60 mL/min
GFR calc non Af Amer: >60 mL/min
Glucose, Bld: 151 mg/dL — ABNORMAL HIGH (ref 70–99)
Potassium: 3.8 mmol/L (ref 3.5–5.1)
Sodium: 136 mmol/L (ref 135–145)
Total Bilirubin: 0.6 mg/dL (ref 0.3–1.2)
Total Protein: 6.6 g/dL (ref 6.5–8.1)

## 2019-07-18 LAB — GLUCOSE, CAPILLARY
Glucose-Capillary: 101 mg/dL — ABNORMAL HIGH (ref 70–99)
Glucose-Capillary: 147 mg/dL — ABNORMAL HIGH (ref 70–99)
Glucose-Capillary: 167 mg/dL — ABNORMAL HIGH (ref 70–99)
Glucose-Capillary: 193 mg/dL — ABNORMAL HIGH (ref 70–99)
Glucose-Capillary: 94 mg/dL (ref 70–99)

## 2019-07-18 LAB — PROTIME-INR
INR: 1.5 — ABNORMAL HIGH (ref 0.8–1.2)
Prothrombin Time: 17.8 s — ABNORMAL HIGH (ref 11.4–15.2)

## 2019-07-18 LAB — PHOSPHORUS: Phosphorus: 3.3 mg/dL (ref 2.5–4.6)

## 2019-07-18 LAB — MAGNESIUM: Magnesium: 2.2 mg/dL (ref 1.7–2.4)

## 2019-07-18 LAB — C-REACTIVE PROTEIN: CRP: 30 mg/dL — ABNORMAL HIGH

## 2019-07-18 LAB — FIBRINOGEN: Fibrinogen: 709 mg/dL — ABNORMAL HIGH (ref 210–475)

## 2019-07-18 LAB — FERRITIN: Ferritin: 281 ng/mL (ref 11–307)

## 2019-07-18 LAB — PREALBUMIN: Prealbumin: <5 mg/dL — ABNORMAL LOW (ref 18–38)

## 2019-07-18 MED ORDER — LIDOCAINE HCL 1 % IJ SOLN
INTRAMUSCULAR | Status: AC | PRN
  Administered 2019-07-18: 10 mL

## 2019-07-18 MED ORDER — SODIUM CHLORIDE 0.9% FLUSH
5.0000 mL | Freq: Three times a day (TID) | INTRAVENOUS | Status: DC
  Administered 2019-07-18 – 2019-07-26 (×21): 5 mL

## 2019-07-18 MED ORDER — MIDAZOLAM HCL 2 MG/2ML IJ SOLN
INTRAMUSCULAR | Status: AC
  Filled 2019-07-18: qty 6

## 2019-07-18 MED ORDER — ONDANSETRON HCL 4 MG/2ML IJ SOLN
INTRAMUSCULAR | Status: AC | PRN
  Administered 2019-07-18: 4 mg via INTRAVENOUS

## 2019-07-18 MED ORDER — ONDANSETRON HCL 4 MG/2ML IJ SOLN
INTRAMUSCULAR | Status: AC
  Filled 2019-07-18: qty 2

## 2019-07-18 MED ORDER — HYDRALAZINE HCL 20 MG/ML IJ SOLN
5.0000 mg | Freq: Once | INTRAMUSCULAR | Status: DC
  Filled 2019-07-18: qty 1

## 2019-07-18 MED ORDER — TRAVASOL 10 % IV SOLN
INTRAVENOUS | Status: AC
  Administered 2019-07-18: 17:00:00 via INTRAVENOUS
  Filled 2019-07-18: qty 792

## 2019-07-18 MED ORDER — ENOXAPARIN SODIUM 60 MG/0.6ML ~~LOC~~ SOLN
60.0000 mg | Freq: Two times a day (BID) | SUBCUTANEOUS | Status: DC
  Administered 2019-07-19 – 2019-07-26 (×15): 60 mg via SUBCUTANEOUS
  Filled 2019-07-18 (×15): qty 0.6

## 2019-07-18 MED ORDER — ENOXAPARIN SODIUM 60 MG/0.6ML ~~LOC~~ SOLN: 60.0000 mg | Freq: Two times a day (BID) | SUBCUTANEOUS | Status: DC

## 2019-07-18 MED ORDER — POTASSIUM CHLORIDE IN NACL 20-0.9 MEQ/L-% IV SOLN
INTRAVENOUS | Status: AC
  Administered 2019-07-18: 18:00:00 via INTRAVENOUS
  Filled 2019-07-18: qty 1000

## 2019-07-18 MED ORDER — FENTANYL CITRATE (PF) 100 MCG/2ML IJ SOLN
INTRAMUSCULAR | Status: AC
  Filled 2019-07-18: qty 2

## 2019-07-18 MED ORDER — MIDAZOLAM HCL 2 MG/2ML IJ SOLN
INTRAMUSCULAR | Status: AC | PRN
  Administered 2019-07-18 (×6): 1 mg via INTRAVENOUS

## 2019-07-18 MED ORDER — FENTANYL CITRATE (PF) 100 MCG/2ML IJ SOLN
INTRAMUSCULAR | Status: AC
  Filled 2019-07-18: qty 4

## 2019-07-18 MED ORDER — FENTANYL CITRATE (PF) 100 MCG/2ML IJ SOLN
INTRAMUSCULAR | Status: AC | PRN
  Administered 2019-07-18 (×6): 50 ug via INTRAVENOUS

## 2019-07-18 NOTE — Progress Notes (Signed)
CN:[id number] pain  Subjective: Back from IR with 3 drain. 2 on the right and one on the left.  At least one is currently feculent on the right.  She remains very anxious, she is dropping her saturations on and off.  She has a facemask that is only on about 1/2 of the way.   She was complaining about the TPN and lasix making her bladder hurt because she can't hold that much volume.    Objective: Vital signs in last 24 hours: Temp:  [96.6 F (35.9 C)-100.8 F (38.2 C)] 97.4 F (36.3 C) (11/25 0506) Pulse Rate:  [40-87] 67 (11/25 0815) Resp:  [18-25] 20 (11/25 0815) BP: (127-183)/(68-93) 156/93 (11/25 0815) SpO2:  [89 %-99 %] 99 % (11/25 0815) Last BM Date: 07/17/19 660 p.o. yesterday  1800 IV 150 urine recorded BM x1 Afebrile vital signs are stable CMP is stable WBC 12.2 H/H 12.2/40(11/23)>> 10.6/32.8(11/25) Covid test was positive yesterday CRP 30; D-dimer 6.41 INR 1.5 intake/Output from previous day: 11/24 0701 - 11/25 0700 In: 2431.5 [P.O.:660; I.V.:947.3; IV Piggyback:824.2] Out: 150 [Urine:150] Intake/Output this shift: No intake/output data recorded.  General appearance: alert, cooperative and extremly anxious Resp: currently on a facemask for O2 sats.  GI: large, with 3 drains, 2 on the right and one on the left.  One on the right is feculent.  Tenderness about the same.    Lab Results:  Recent Labs    07/17/19 0615 07/18/19 0449  WBC 16.7* 12.2*  HGB 11.0* 10.6*  HCT 35.1* 32.8*  PLT 366 372    BMET Recent Labs    07/17/19 1034 07/18/19 0449  NA 135 136  K 3.6 3.8  CL 104 105  CO2 22 24  GLUCOSE 83 151*  BUN 6 14  CREATININE 0.72 0.58  CALCIUM 7.7* 7.6*   PT/INR Recent Labs    07/17/19 1601 07/18/19 0449  LABPROT 18.0* 17.8*  INR 1.5* 1.5*    Recent Labs  Lab 07/11/19 0914 07/16/19 1433 07/17/19 1034 07/18/19 0449  AST 11* 22 23 30   ALT 13 15 14 16   ALKPHOS 75 94 87 79  BILITOT 1.0 0.4 0.7 0.6  PROT 6.2* 6.6 6.8 6.6   ALBUMIN 3.0* 2.3* 2.4* 2.2*     Lipase     Component Value Date/Time   LIPASE 17 07/07/2019 2057     Medications: . acetaminophen  1,000 mg Oral Q6H  . benazepril  20 mg Oral Daily  . busPIRone  30 mg Oral BID  . Chlorhexidine Gluconate Cloth  6 each Topical Daily  . desvenlafaxine  50 mg Oral Daily  . dexamethasone (DECADRON) injection  6 mg Intravenous Q24H  . enoxaparin (LOVENOX) injection  60 mg Subcutaneous Q24H  . feeding supplement (PRO-STAT SUGAR FREE 64)  30 mL Oral BID  . gabapentin  300 mg Oral TID  . insulin aspart  0-9 Units Subcutaneous Q6H  . pneumococcal 23 valent vaccine  0.5 mL Intramuscular Tomorrow-1000  . potassium chloride  20 mEq Oral BID  . Ensure Max Protein  11 oz Oral BID  . tiZANidine  4 mg Oral QHS  . topiramate  100 mg Oral Daily    Assessment/Plan Covid positive: 07/17/2019 (Covid negative: 07/07/2019) Hx IBS/questionable Crohn's disease AKI Hypertension Fibromyalgia-generalized Back,hip,and leg pain Hx PE-off anticoagulants Tobacco use EtOH use Morbid obesity-BMI 48.79 Extensive bilateral patchy pulmonary infiltrates primarly upper lobes; small bilateral effusions  - Medicine consult Severe malnutrition - prealbumin <5  Acute sigmoid diverticulitis -  CT 07/11/2019: Diffuse colonic diverticulosis, worsening inflammation around the sigmoid colon with new inflammation along the anterior wall of the ascending colon increased pneumoperitoneum throughout the abdomen pelvis along with perihepatic ascites.   -Fever 11/21 and 11/23  -Repeat CT 11/23: Diverticular disease with 7.8 x 4.3 cm air-fluid collection; ascending colon air/fluid collection 13 x 7 cm also consistent with possible pericolonic abscess, a third collection lateral aspect the right lobe of the liver measuring 15 x 6.2 causing mass-effect upon the liver.  -CT also showed bilateral pleural effusions which have increased since the last CT.  Additional patchy infiltrate  noted throughout both lungs primarily within the bases although now seen in the upper lobes bilaterally.  -CT of the chest 11/23: No pulmonary emboli, extensive patchy bilateral pulmonary infiltrates primarily in the upper lobes.  Small bilateral pleural effusions left greater than right free air under the right hemidiaphragm - Plan IR drain placement today   FEN: IV fluids/clear liquids>>advance to soft diet ID: [id number] DVT: SCDs only;Lovenox restart tomorrow at 10AM Follow-up: TBD   Plan:  I have ask [facility] to consider transferring her to their service as Primary due to her COVID.  We will continue to follow and assist with the diverticulitis.  Continue drains and abx, I will put her back on clears.   LOS: 10 days    Holly Moon 07/18/2019 Please see Amion

## 2019-07-18 NOTE — Progress Notes (Signed)
PROGRESS NOTE                                                                                                                                                                                                             Patient Demographics:    Holly Moon, is a 48 y.o. female, DOB - Jul 14, 1971, ZOX:[id number]  Admit date - 07/07/2019   Admitting Physician Md Montez Morita, MD  Outpatient Primary MD for the patient is [name], [place]  LOS - 10    Chief Complaint  Patient presents with   Abdominal Pain   Near Syncope   Fever   Diarrhea       Brief Narrative 48 year old morbidly obese female with essential hypertension, fibromyalgia, history of PE off anticoagulation, alcohol and tobacco use who is admitted to surgical service with complicated acute sigmoid diverticulitis with developing fluid collection and abscess.  Abdominal CT from 11/18 showed diffuse colon diverticulosis with worsening inflammation around sigmoid colon and increased pneumoperitoneum with perihepatic ascites.  Repeat CT on 11/23 with possible abscesses.  Patient on empiric IV Zosyn and IR consulted for image guided drainage.  Hospitalist consulted for intermittent fever with new bilateral upper lobe infiltrate with effusion, increasing leukocytosis.  On 11/23 rapid response was called due to fever of 130s Fahrenheit with tachycardia and tachypnea.  CT angiogram of the chest was negative for PE but showed extensive patchy bilateral pulmonary infiltrate in the upper lobes with small bilateral pleural effusion (L >R) reportedly patient had history of Covid in the family about 1 week prior to hospitalization.  Covid PCR test were checked on admission on 11/14.  Given her acute respiratory symptoms COVID-19 was tested again and came back positive.  Along with that she was found to have elevated D-dimer of 9.1, CRP of 30, fibrinogen of 700 and normal ferritin.   Patient placed on airborne and contact precautions and started on empiric Decadron and remdesivir.    Subjective:   Patient seen after returning from IR procedure.  Complains of off-and-on pain.  Was placed on Venturi mask which she is nonadherent to.  Sats are in the mid 90s on room air.  Gets anxious frequently.   Assessment  & Plan :   Principal problem Severe sepsis with acute hypoxic respiratory failure (HCC) Secondary to new COVID-19 infection diagnosed during hospital stay. Elevated  inflammatory markers including CRP, D-dimer, LDH, fibrinogen and procalcitonin, trending down in a.m. lab. Patient started on IV Decadron and remdesivir.  Given her active sigmoid diverticulitis with abscess Actemra has not been given. Currently maintaining sats on room air.  Monitor closely. TR[facility] will take over care as primary team.  May need to be transferred to [facility] if no active surgical intervention planned.  Perforated sigmoid diverticulitis with abscess Underwent CT-guided 12 French drainage catheter placement in the left lower abdomen/pelvis adjacent to the suspected colonic perforation today.  About 45 cc feculent material was aspirated and sent for microscopy. Continue empiric IV Zosyn.  Further management per surgery. Currently on clear liquid supplemented with TPN.  Control as needed.  Essential hypertension Continue ACE inhibitor.  History of fibromyalgia Continue Topamax and Zanaflex.  Morbid obesity  History of tobacco and alcohol use No signs of withdrawal.  Patient reported quitting smoking 10 days prior to admission.   Code Status : Full code  Family Communication  : None  Disposition Plan  : Pending hospital course  Barriers For Discharge : Active symptoms  Consults  : CCS, IR  Procedures  : CT abdomen pelvis, CT angiogram of the chest, IR drainage of sigmoid abscess with perforation  DVT Prophylaxis  :  Lovenox  Lab Results  Component Value Date    PLT 372 07/18/2019    Antibiotics  :    Anti-infectives (From admission, onward)   Start     Dose/Rate Route Frequency Ordered Stop   07/19/19 1000  remdesivir 100 mg in sodium chloride 0.9 % 250 mL IVPB     100 mg 500 mL/hr over 30 Minutes Intravenous Daily 07/17/19 1822 07/22/19 0959   07/18/19 1600  remdesivir 100 mg in sodium chloride 0.9 % 250 mL IVPB     100 mg 500 mL/hr over 30 Minutes Intravenous  Once 07/17/19 1822     07/18/19 0600  vancomycin (VANCOCIN) 1,250 mg in sodium chloride 0.9 % 250 mL IVPB     1,250 mg 166.7 mL/hr over 90 Minutes Intravenous Every 12 hours 07/17/19 1055     07/17/19 1830  remdesivir 200 mg in sodium chloride 0.9 % 250 mL IVPB     200 mg 500 mL/hr over 30 Minutes Intravenous Once 07/17/19 1822 07/17/19 2314   07/17/19 1100  vancomycin (VANCOCIN) 2,500 mg in sodium chloride 0.9 % 500 mL IVPB     2,500 mg 250 mL/hr over 120 Minutes Intravenous  Once 07/17/19 1004 07/17/19 2037   07/08/19 0600  piperacillin-tazobactam (ZOSYN) IVPB 3.375 g     3.375 g 12.5 mL/hr over 240 Minutes Intravenous Every 8 hours 07/08/19 0129     07/07/19 2315  piperacillin-tazobactam (ZOSYN) IVPB 3.375 g     3.375 g 100 mL/hr over 30 Minutes Intravenous  Once 07/07/19 2314 07/07/19 2356        Objective:   Vitals:   07/18/19 1204 07/18/19 1220 07/18/19 1301 07/18/19 1325  BP:  (!) 152/73 (!) 153/66   Pulse:  69 68   Resp:  (!) 32    Temp:  99.1 F (37.3 C)    TempSrc:  Oral    SpO2: 95% 97% 94% 93%  Weight:      Height:        Wt Readings from Last 3 Encounters:  07/07/19 133 kg  02/17/18 (!) 155.1 kg  11/07/16 127 kg     Intake/Output Summary (Last 24 hours) at 07/18/2019 1456 Last data filed  at 07/18/2019 1323 Gross per 24 hour  Intake 2446.49 ml  Output 285 ml  Net 2161.49 ml     Physical Exam  Gen: not in distress HEENT: moist mucosa, supple neck Chest: clear b/l, no added sounds CVS: N S1&S2, no murmurs,  GI: soft, NT, nondistended,  sluggish bowel sounds, drain over right lower quadrant Musculoskeletal: warm, no edema     Data Review:    CBC Recent Labs  Lab 07/14/19 0602 07/15/19 0545 07/16/19 0548 07/17/19 0615 07/18/19 0449  WBC 15.1* 13.3* 11.5* 16.7* 12.2*  HGB 11.4* 11.5* 12.2 11.0* 10.6*  HCT 37.1 37.3 40.1 35.1* 32.8*  PLT 476* 460* 446* 366 372  MCV 90.9 90.3 90.9 88.4 86.1  MCH 27.9 27.8 27.7 27.7 27.8  MCHC 30.7 30.8 30.4 31.3 32.3  RDW 15.6* 15.7* 16.1* 15.9* 15.6*  LYMPHSABS  --   --   --   --  0.7  MONOABS  --   --   --   --  0.3  EOSABS  --   --   --   --  0.0  BASOSABS  --   --   --   --  0.0    Chemistries  Recent Labs  Lab 07/13/19 0616 07/14/19 0602 07/16/19 1433 07/17/19 1034 07/18/19 0449  NA 135 139 135 135 136  K 3.2* 3.7 3.9 3.6 3.8  CL 106 109 105 104 105  CO2 23 23 20* 22 24  GLUCOSE 98 92 93 83 151*  BUN 7 7 7 6 14   CREATININE 0.83 0.74 0.69 0.72 0.58  CALCIUM 7.7* 8.0* 8.0* 7.7* 7.6*  MG  --   --   --  2.1 2.2  AST  --   --  22 23 30   ALT  --   --  15 14 16   ALKPHOS  --   --  94 87 79  BILITOT  --   --  0.4 0.7 0.6   ------------------------------------------------------------------------------------------------------------------ Recent Labs    07/18/19 0449  TRIG 185*    No results found for: HGBA1C ------------------------------------------------------------------------------------------------------------------ No results for input(s): TSH, T4TOTAL, T3FREE, THYROIDAB in the last 72 hours.  Invalid input(s): FREET3 ------------------------------------------------------------------------------------------------------------------ Recent Labs    07/17/19 1034 07/18/19 0449  FERRITIN 221 281    Coagulation profile Recent Labs  Lab 07/17/19 1601 07/18/19 0449  INR 1.5* 1.5*    Recent Labs    07/17/19 1034 07/18/19 0449  DDIMER 9.11* 6.41*    Cardiac Enzymes No results for input(s): CKMB, TROPONINI, MYOGLOBIN in the last 168  hours.  Invalid input(s): CK ------------------------------------------------------------------------------------------------------------------ No results found for: BNP  Inpatient Medications  Scheduled Meds:  acetaminophen  1,000 mg Oral Q6H   benazepril  20 mg Oral Daily   busPIRone  30 mg Oral BID   Chlorhexidine Gluconate Cloth  6 each Topical Daily   desvenlafaxine  50 mg Oral Daily   dexamethasone (DECADRON) injection  6 mg Intravenous Q24H   [START ON 07/19/2019] enoxaparin (LOVENOX) injection  60 mg Subcutaneous Q12H   feeding supplement (PRO-STAT SUGAR FREE 64)  30 mL Oral BID   fentaNYL       fentaNYL       gabapentin  300 mg Oral TID   insulin aspart  0-9 Units Subcutaneous Q6H   midazolam       ondansetron       pneumococcal 23 valent vaccine  0.5 mL Intramuscular Tomorrow-1000   potassium chloride  20 mEq Oral BID  Ensure Max Protein  11 oz Oral BID   sodium chloride flush  5 mL Intracatheter Q8H   tiZANidine  4 mg Oral QHS   topiramate  100 mg Oral Daily   Continuous Infusions:  0.9 % NaCl with KCl 20 mEq / L 60 mL/hr at 07/17/19 1833   0.9 % NaCl with KCl 20 mEq / L     piperacillin-tazobactam (ZOSYN)  IV 3.375 g (07/18/19 1323)   remdesivir 100 mg in NS 250 mL     Followed by   [START ON 07/19/2019] remdesivir 100 mg in NS 250 mL     TPN ADULT (ION) 40 mL/hr at 07/17/19 1815   TPN ADULT (ION)     vancomycin 1,250 mg (07/18/19 0515)   PRN Meds:.ALPRAZolam, alum & mag hydroxide-simeth, diphenhydrAMINE **OR** diphenhydrAMINE, HYDROmorphone (DILAUDID) injection, lip balm, methocarbamol, ondansetron **OR** ondansetron (ZOFRAN) IV, oxyCODONE, simethicone, sodium chloride flush  Micro Results Recent Results (from the past 240 hour(s))  SARS CORONAVIRUS 2 (TAT 6-24 HRS) Nasopharyngeal Nasopharyngeal Swab     Status: Abnormal   Collection Time: 07/17/19  8:14 AM   Specimen: Nasopharyngeal Swab  Result Value Ref Range Status   SARS  Coronavirus 2 POSITIVE (A) NEGATIVE Final    Comment: RESULT CALLED TO, READ BACK BY AND VERIFIED WITH: Cory MunchA. Melton RN 15:15 07/17/19 ([email or web address]) (NOTE) SARS-CoV-2 target nucleic acids are DETECTED. The SARS-CoV-2 RNA is generally detectable in upper and lower respiratory specimens during the acute phase of infection. Positive results are indicative of the presence of SARS-CoV-2 RNA. Clinical correlation with patient history and other diagnostic information is  necessary to determine patient infection status. Positive results do not rule out bacterial infection or co-infection with other viruses.  The expected result is Negative. Fact Sheet for Patients: HairSlick.nohttps://www.fda.gov/media/138098/download Fact Sheet for Healthcare Providers: quierodirigir.comhttps://www.fda.gov/media/138095/download This test is not yet approved or cleared by the [place] FDA and  has been authorized for detection and/or diagnosis of SARS-CoV-2 by FDA under an Emergency Use Authorization (EUA). This EUA will remain  in effect (meaning this test can be used) for t he duration of the COVID-19 declaration under Section 564(b)(1) of the Act, 21 U.S.C. section 360bbb-3(b)(1), unless the authorization is terminated or revoked sooner. Performed at [facility], [address] [place], [place] [address]   Culture, blood (routine x 2)     Status: None (Preliminary result)   Collection Time: 07/17/19 10:34 AM   Specimen: BLOOD  Result Value Ref Range Status   Specimen Description   Final    BLOOD RIGHT ARM Performed at [facility], [address] [place], [place] [address]    Special Requests   Final    BOTTLES DRAWN AEROBIC ONLY Blood Culture adequate volume Performed at [facility], [address] [place], [place] [address]    Culture   Final    NO GROWTH < 24 HOURS Performed at [facility], [address] [place], [place] [address]    Report Status PENDING   Incomplete  Culture, blood (routine x 2)     Status: None (Preliminary result)   Collection Time: 07/17/19  4:00 PM   Specimen: BLOOD  Result Value Ref Range Status   Specimen Description   Final    BLOOD RIGHT ANTECUBITAL Performed at [facility], [address] [place], [place] [address]    Special Requests   Final    BOTTLES DRAWN AEROBIC ONLY Blood Culture adequate volume  Performed at [facility], [address] [place], [place] [address]    Culture   Final    NO GROWTH < 24 HOURS Performed at [facility], [address], [place], [place] [address]    Report Status PENDING  Incomplete    Radiology Reports Ct Angio Chest Pe W Or Wo Contrast  Result Date: 07/16/2019 CLINICAL DATA:  Acute onset of shortness of breath today EXAM: CT ANGIOGRAPHY CHEST WITH CONTRAST TECHNIQUE: Multidetector CT imaging of the chest was performed using the standard protocol during bolus administration of intravenous contrast. Multiplanar CT image reconstructions and MIPs were obtained to evaluate the vascular anatomy. CONTRAST:  80mL OMNIPAQUE IOHEXOL 350 MG/ML SOLN COMPARISON:  Chest x-ray 07/07/2019 FINDINGS: Cardiovascular: Satisfactory opacification of the pulmonary arteries to the segmental level. No evidence of pulmonary embolism. Normal heart size. Tiny pericardial effusion. Mediastinum/Nodes: No enlarged mediastinal, hilar, or axillary lymph nodes. Thyroid gland, trachea, and esophagus demonstrate no significant findings. Lungs/Pleura: Patient has extensive patchy bilateral pulmonary infiltrates primarily in the upper lobes. Small bilateral pleural effusions, slightly greater on the left. Slight atelectasis in the right middle lobe and in the right lower lobe. Upper Abdomen: Free air under the right hemidiaphragm. See CT scan of the abdomen report for further information. Musculoskeletal: No chest wall abnormality. No acute or significant osseous findings.  Review of the MIP images confirms the above findings. IMPRESSION: 1. No pulmonary emboli. 2. Extensive patchy bilateral pulmonary infiltrates primarily in the upper lobes. 3. Small bilateral pleural effusions, slightly greater on the left. 4. Free air under the right hemidiaphragm. See CT scan of the abdomen report for further information. Electronically Signed   By: Francene Boyers M.D.   On: 07/16/2019 21:25   Ct Abdomen Pelvis W Contrast  Result Date: 07/16/2019 CLINICAL DATA:  Abdominal pain and recurrent fever EXAM: CT ABDOMEN AND PELVIS WITH CONTRAST TECHNIQUE: Multidetector CT imaging of the abdomen and pelvis was performed using the standard protocol following bolus administration of intravenous contrast. CONTRAST:  OMNIPAQUE IOHEXOL 300 MG/ML  SOLN COMPARISON:  07/11/2019 FINDINGS: Lower chest: Bilateral small effusions are noted which have increased somewhat in the interval from the prior exam. Patchy infiltrate is again identified in the lung bases but has increased somewhat from the prior exams particularly in the upper lobes bilaterally. Hepatobiliary: The liver is within normal limits. The gallbladder is well distended with multiple gallstones within. Pancreas: Unremarkable. No pancreatic ductal dilatation or surrounding inflammatory changes. Spleen: Normal in size without focal abnormality. Adrenals/Urinary Tract: Adrenal glands are within normal limits. Kidneys demonstrate a normal enhancement pattern bilaterally. Delayed images demonstrate normal excretion without obstructive change. The bladder is partially distended. Stomach/Bowel: There again noted changes consistent with diverticular disease throughout the colon. Adjacent to the sigmoid colon however in an area of previous inflammation there is now an air-fluid collection that measures at least 7.8 x 4.3 cm in dimension within air-fluid level within consistent with a pericolonic abscess. Additionally there are findings along the  ascending colon suggestive of rupture of the ascending colon with increase in intraperitoneal air. A large air fluid collection is identified in the right mid abdomen which measures at least 13 by 7 cm in dimension best seen on image number 65 of series 2. This lies adjacent to the inflamed ascending colon and is also consistent with a pericolonic abscess. Additionally communicating with this large collection is a third air-fluid collection which lies along the lateral aspect of the right lobe of  the liver causing mass effect upon the adjacent liver measuring 15 x 6.2 cm in greatest dimension. The appendix is within normal limits. The small bowel is also unremarkable. The stomach is within normal limits. Vascular/Lymphatic: No significant vascular findings are present. No enlarged abdominal or pelvic lymph nodes. Reproductive: Uterus and bilateral adnexa are unremarkable. Other: No abdominal wall hernia or abnormality. No abdominopelvic ascites. Musculoskeletal: Degenerative changes of the lumbar spine are seen. IMPRESSION: There are changes consistent with progressive inflammatory change involving the colon primarily within the ascending colon in an area where prior small pericolonic air collections were noted. This results in a large somewhat bilobed air-fluid collection which extends along the lateral aspect of the liver as well as anterior to the ascending colon. It is possible the collection anterior to the ascending colon is dilatation of the ascending colon although felt to be less likely given the overall appearance. A third collection is noted along the course of the sigmoid colon consistent with increasing pericolonic abscess as described. Bilateral pleural effusions which have increased in the interval from the prior exam. Additionally increasing patchy infiltrate is noted throughout both lungs primarily within the bases although now seen within the upper lobes bilaterally. These results will be called to  the ordering clinician or representative by the Radiologist Assistant, and communication documented in the PACS or zVision Dashboard. Electronically Signed   By: Alcide Clever M.D.   On: 07/16/2019 19:49   Ct Abdomen Pelvis W Contrast  Addendum Date: 07/11/2019   ADDENDUM REPORT: 07/11/2019 20:01 ADDENDUM: Critical Value/emergent results were called by telephone at the time of interpretation on 07/11/2019 at 8:01 pm to providerDR. Romie Levee , who verbally acknowledged these results. Electronically Signed   By: Charlett Nose M.D.   On: 07/11/2019 20:01   Result Date: 07/11/2019 CLINICAL DATA:  Abdominal pain EXAM: CT ABDOMEN AND PELVIS WITH CONTRAST TECHNIQUE: Multidetector CT imaging of the abdomen and pelvis was performed using the standard protocol following bolus administration of intravenous contrast. CONTRAST:  OMNIPAQUE IOHEXOL 300 MG/ML SOLN, 30mL OMNIPAQUE IOHEXOL 300 MG/ML SOLN COMPARISON:  07/07/2019 FINDINGS: Lower chest: Trace bilateral pleural effusions. Bibasilar atelectasis. Heart is normal size. Hepatobiliary: Gallstones within the gallbladder. No focal hepatic abnormality. Pancreas: No focal abnormality or ductal dilatation. Spleen: No focal abnormality.  Normal size. Adrenals/Urinary Tract: No adrenal abnormality. No focal renal abnormality. No stones or hydronephrosis. Urinary bladder is unremarkable. Stomach/Bowel: Extensive colonic diverticulosis. Stranding/inflammation around the sigmoid colon has progressed since prior study. There is also abnormal stranding along the anterior aspect of the ascending colon which is new since prior study. Increasing pneumoperitoneum with locules of gas throughout the peritoneum. Is difficult to determine if the source is the sigmoid colon or the ascending colon. Appendix is normal. Stomach and small bowel decompressed. Vascular/Lymphatic: No evidence of aneurysm or adenopathy. Reproductive: Uterus and adnexa unremarkable.  No mass. Other: Small  amount of perihepatic ascites. Musculoskeletal: No acute bony abnormality. IMPRESSION: Diffuse colonic diverticulosis. Worsening inflammation around the sigmoid colon with new inflammation along the anterior wall of the ascending colon. There is increasing pneumoperitoneum throughout the abdomen and pelvis along with new perihepatic ascites. Findings compatible with perforation. It is difficult to determine exact source of free air. The majority of the free intraperitoneal air is centered anterior to the ascending colon and liver suggesting the possibility that the ascending colon is the source. Cholelithiasis. Trace bilateral effusions.  Bibasilar atelectasis. Electronically Signed: By: Charlett Nose M.D. On: 07/11/2019 19:32  Ct Abdomen Pelvis W Contrast  Result Date: 07/07/2019 CLINICAL DATA:  Abdominal pain diarrhea EXAM: CT ABDOMEN AND PELVIS WITH CONTRAST TECHNIQUE: Multidetector CT imaging of the abdomen and pelvis was performed using the standard protocol following bolus administration of intravenous contrast. CONTRAST:  OMNIPAQUE IOHEXOL 300 MG/ML  SOLN COMPARISON:  None. FINDINGS: Lower chest: Lung bases demonstrate no acute consolidation or effusion. The heart size is within normal limits. Hepatobiliary: No focal hepatic abnormality or biliary dilatation. Increased intraluminal density within the gallbladder. Pancreas: Unremarkable. No pancreatic ductal dilatation or surrounding inflammatory changes. Spleen: Normal in size without focal abnormality. Adrenals/Urinary Tract: Adrenal glands are unremarkable. Kidneys are normal, without renal calculi, focal lesion, or hydronephrosis. Bladder is unremarkable. Stomach/Bowel: The stomach is nonenlarged. No dilated small bowel. Nondilated appendix. Hyperdense focus at the cecum near the origin of the appendix, possible diverticulum versus small appendicoliths but no evidence for appendiceal inflammatory change. Fluid within the colon. Sigmoid colon  diverticular disease with wall thickening and inflammatory change consistent with acute diverticulitis. Multiple foci of extraluminal gas at the inflamed sigmoid colon. Vascular/Lymphatic: Nonaneurysmal aorta. No significantly enlarged lymph nodes. Reproductive: Uterus and bilateral adnexa are unremarkable. Other: Small foci of extraluminal gas within the right lower quadrant adjacent to the inflamed sigmoid colon. Tiny foci of gas within the right upper quadrant and overlying the dome of the liver. Musculoskeletal: No acute or suspicious osseous abnormality. There are degenerative changes. IMPRESSION: 1. Focal inflammatory changes and wall thickening involving the distal sigmoid colon consistent with acute diverticulitis. Multiple small foci of gas adjacent to the inflamed sigmoid colon with small foci of free air adjacent to the liver, consistent with perforation. Negative for organized abscess at this time. 2. Increased intraluminal density within the gallbladder, possible stones or sludge. Critical Value/emergent results were called by telephone at the time of interpretation on 07/07/2019 at 11:12 pm to providerBOWIE TRAN , who verbally acknowledged these results. Electronically Signed   By: Jasmine Pang M.D.   On: 07/07/2019 23:12   Dg Chest Portable 1 View  Result Date: 07/07/2019 CLINICAL DATA:  Fever, recent COVID exposure. EXAM: PORTABLE CHEST 1 VIEW COMPARISON:  04/24/2013 FINDINGS: The heart size and mediastinal contours are within normal limits. Both lungs are clear. The visualized skeletal structures are unremarkable. IMPRESSION: No active disease. Electronically Signed   By: Charlett Nose M.D.   On: 07/07/2019 19:45   Ct Image Guided Drainage By Percutaneous Catheter  Result Date: 07/18/2019 INDICATION: Colonic perforation of indeterminate source now with multiple intra-abdominal mixed fluid though predominantly air containing collections within the abdomen. Active COVID-19 infection. Please  perform CT-guided percutaneous drainage catheter(s) placement(s) for infection source control purposes. EXAM: CT IMAGE GUIDED DRAINAGE BY PERCUTANEOUS CATHETER x2 COMPARISON:  CT abdomen pelvis - 07/11/2019; 07/07/2019 MEDICATIONS: The patient is currently admitted to the hospital and receiving intravenous antibiotics. The antibiotics were administered within an appropriate time frame prior to the initiation of the procedure. ANESTHESIA/SEDATION: Moderate (conscious) sedation was employed during this procedure. A total of Versed 6 mg and Fentanyl 300 mcg was administered intravenously. Moderate Sedation Time: 50 minutes. The patient's level of consciousness and vital signs were monitored continuously by radiology nursing throughout the procedure under my direct supervision. CONTRAST:  None COMPLICATIONS: None immediate. PROCEDURE: Informed written consent was obtained from the patient after a discussion of the risks, benefits and alternatives to treatment. The patient was placed supine on the CT gantry and a pre procedural CT was performed re-demonstrating the known abscesses/air and fluid  collections within the with dominant collection within the right upper abdominal quadrant measuring approximately 14.8 x 6.3 cm (image 50, series 2) dominant predominantly air collection within right lower quadrant measuring approximately 15.5 x 8.3 cm (image 72, series 2) and dominant mixed and air and fluid containing collection within the midline of the lower abdomen/pelvis measuring approximately 9.7 x 5.1 cm (image 97, series 2). The procedure was planned. A timeout was performed prior to the initiation of the procedure. The right upper and mid abdomen as well as the left lower abdomen was was prepped and draped in the usual sterile fashion. The overlying soft tissues were anesthetized with 1% lidocaine with epinephrine. 18 gauge trocar needles were advanced into each fluid collection in short Amplatz wires were coiled within  each separate collection. Next, sequentially, each track was serially dilated allowing placement of 12 French percutaneous drainage catheters under intermittent CT fluoroscopic guidance. Completion CT imaging was obtained and drainage catheters were retracted into the dominant component of each air and fluid containing collection. Postprocedural CT imaging was obtained. A total of approximately 45 cc of feculent appearing fluid was aspirated from all 3 drains, with the largest amount aspirated from the left lower quadrant percutaneous drainage catheter. The drainage catheter within the left lower abdominal quadrant was connected to a gravity bag while the other 2 percutaneous drainage catheters were connected to JP bulbs. All drainage catheters were secured at the skin entrance site within interrupted sutures and Stat Lock devices. Dressings were applied. The patient tolerated the procedure well without immediate postprocedural complication. IMPRESSION: Technically successful CT-guided placement of 3 separate percutaneous drainage catheters into dominant mixed fluid though predominantly air containing collections within the right upper and mid abdomen as well as the left lower abdominal quadrant. Electronically Signed   By: Simonne Come M.D.   On: 07/18/2019 13:05   Ct Image Guided Drainage By Percutaneous Catheter  Result Date: 07/18/2019 INDICATION: Colonic perforation of indeterminate source now with multiple intra-abdominal mixed fluid though predominantly air containing collections within the abdomen. Active COVID-19 infection. Please perform CT-guided percutaneous drainage catheter(s) placement(s) for infection source control purposes. EXAM: CT IMAGE GUIDED DRAINAGE BY PERCUTANEOUS CATHETER x2 COMPARISON:  CT abdomen pelvis - 07/11/2019; 07/07/2019 MEDICATIONS: The patient is currently admitted to the hospital and receiving intravenous antibiotics. The antibiotics were administered within an appropriate  time frame prior to the initiation of the procedure. ANESTHESIA/SEDATION: Moderate (conscious) sedation was employed during this procedure. A total of Versed 6 mg and Fentanyl 300 mcg was administered intravenously. Moderate Sedation Time: 50 minutes. The patient's level of consciousness and vital signs were monitored continuously by radiology nursing throughout the procedure under my direct supervision. CONTRAST:  None COMPLICATIONS: None immediate. PROCEDURE: Informed written consent was obtained from the patient after a discussion of the risks, benefits and alternatives to treatment. The patient was placed supine on the CT gantry and a pre procedural CT was performed re-demonstrating the known abscesses/air and fluid collections within the with dominant collection within the right upper abdominal quadrant measuring approximately 14.8 x 6.3 cm (image 50, series 2) dominant predominantly air collection within right lower quadrant measuring approximately 15.5 x 8.3 cm (image 72, series 2) and dominant mixed and air and fluid containing collection within the midline of the lower abdomen/pelvis measuring approximately 9.7 x 5.1 cm (image 97, series 2). The procedure was planned. A timeout was performed prior to the initiation of the procedure. The right upper and mid abdomen as well as the left  lower abdomen was was prepped and draped in the usual sterile fashion. The overlying soft tissues were anesthetized with 1% lidocaine with epinephrine. 18 gauge trocar needles were advanced into each fluid collection in short Amplatz wires were coiled within each separate collection. Next, sequentially, each track was serially dilated allowing placement of 12 French percutaneous drainage catheters under intermittent CT fluoroscopic guidance. Completion CT imaging was obtained and drainage catheters were retracted into the dominant component of each air and fluid containing collection. Postprocedural CT imaging was obtained. A  total of approximately 45 cc of feculent appearing fluid was aspirated from all 3 drains, with the largest amount aspirated from the left lower quadrant percutaneous drainage catheter. The drainage catheter within the left lower abdominal quadrant was connected to a gravity bag while the other 2 percutaneous drainage catheters were connected to JP bulbs. All drainage catheters were secured at the skin entrance site within interrupted sutures and Stat Lock devices. Dressings were applied. The patient tolerated the procedure well without immediate postprocedural complication. IMPRESSION: Technically successful CT-guided placement of 3 separate percutaneous drainage catheters into dominant mixed fluid though predominantly air containing collections within the right upper and mid abdomen as well as the left lower abdominal quadrant. Electronically Signed   By: Simonne Come M.D.   On: 07/18/2019 13:05   Ct Image Guided Drainage By Percutaneous Catheter  Result Date: 07/18/2019 INDICATION: Colonic perforation of indeterminate source now with multiple intra-abdominal mixed fluid though predominantly air containing collections within the abdomen. Active COVID-19 infection. Please perform CT-guided percutaneous drainage catheter(s) placement(s) for infection source control purposes. EXAM: CT IMAGE GUIDED DRAINAGE BY PERCUTANEOUS CATHETER x2 COMPARISON:  CT abdomen pelvis - 07/11/2019; 07/07/2019 MEDICATIONS: The patient is currently admitted to the hospital and receiving intravenous antibiotics. The antibiotics were administered within an appropriate time frame prior to the initiation of the procedure. ANESTHESIA/SEDATION: Moderate (conscious) sedation was employed during this procedure. A total of Versed 6 mg and Fentanyl 300 mcg was administered intravenously. Moderate Sedation Time: 50 minutes. The patient's level of consciousness and vital signs were monitored continuously by radiology nursing throughout the  procedure under my direct supervision. CONTRAST:  None COMPLICATIONS: None immediate. PROCEDURE: Informed written consent was obtained from the patient after a discussion of the risks, benefits and alternatives to treatment. The patient was placed supine on the CT gantry and a pre procedural CT was performed re-demonstrating the known abscesses/air and fluid collections within the with dominant collection within the right upper abdominal quadrant measuring approximately 14.8 x 6.3 cm (image 50, series 2) dominant predominantly air collection within right lower quadrant measuring approximately 15.5 x 8.3 cm (image 72, series 2) and dominant mixed and air and fluid containing collection within the midline of the lower abdomen/pelvis measuring approximately 9.7 x 5.1 cm (image 97, series 2). The procedure was planned. A timeout was performed prior to the initiation of the procedure. The right upper and mid abdomen as well as the left lower abdomen was was prepped and draped in the usual sterile fashion. The overlying soft tissues were anesthetized with 1% lidocaine with epinephrine. 18 gauge trocar needles were advanced into each fluid collection in short Amplatz wires were coiled within each separate collection. Next, sequentially, each track was serially dilated allowing placement of 12 French percutaneous drainage catheters under intermittent CT fluoroscopic guidance. Completion CT imaging was obtained and drainage catheters were retracted into the dominant component of each air and fluid containing collection. Postprocedural CT imaging was obtained. A total of  approximately 45 cc of feculent appearing fluid was aspirated from all 3 drains, with the largest amount aspirated from the left lower quadrant percutaneous drainage catheter. The drainage catheter within the left lower abdominal quadrant was connected to a gravity bag while the other 2 percutaneous drainage catheters were connected to JP bulbs. All drainage  catheters were secured at the skin entrance site within interrupted sutures and Stat Lock devices. Dressings were applied. The patient tolerated the procedure well without immediate postprocedural complication. IMPRESSION: Technically successful CT-guided placement of 3 separate percutaneous drainage catheters into dominant mixed fluid though predominantly air containing collections within the right upper and mid abdomen as well as the left lower abdominal quadrant. Electronically Signed   By: Simonne Come M.D.   On: 07/18/2019 13:05   [place] Ekg Site Rite  Result Date: 07/17/2019 If Site Rite image not attached, placement could not be confirmed due to current cardiac rhythm.   Time Spent in minutes 35   Mikaia Janvier M.D on 07/18/2019 at 2:56 PM  Between 7am to 7pm - Pager - [phone]  After 7pm go to [email or web address] - password [email or web address]  [facility] -  Office  [phone]

## 2019-07-18 NOTE — Progress Notes (Signed)
PHARMACY - ADULT TOTAL PARENTERAL NUTRITION CONSULT NOTE   Pharmacy Consult for TPN Indication:  Intra-abd infection  HPI:  47 yoF admit 11/14 with abd pain, acute sigmoid diverticulitis with pneumoperitoneum, ascites  TPN Access: plan PICC 11/24 TPN start date:    Patient Measurements: Estimated body mass index is 48.79 kg/m as calculated from the following:   Height as of this encounter: 5\' 5"  (1.651 m).   Weight as of this encounter: 293 lb 3.4 oz (133 kg).  Intake/Output: 11/24 2281 mL, may not be charting all output as only 150 ml of urine charted    Estimated Nutritional Needs - Per RD recommendations on 11/24 Kcal:  1995-2260 kcal  Protein:  100-113 grams  Fluid:  >/= 2 L/day   Rx target rate at 85 ml/hr will yield  : 112 gm protein, 2105 kCal per day   Current Nutrition: NPO  IVF: NS with 20 mEq KCl per L at 60 ml/hr  Insulin Requirements:    Recent Labs    07/17/19 0615 07/17/19 1034 07/18/19 0449  NA  --  135 136  K  --  3.6 3.8  CL  --  104 105  CO2  --  22 24  GLUCOSE  --  83 151*  BUN  --  6 14  CREATININE  --  0.72 0.58  CALCIUM  --  7.7* 7.6*  PHOS  --  2.8 3.3  MG  --  2.1 2.2  ALBUMIN  --  2.4* 2.2*  ALKPHOS  --  87 79  AST  --  23 30  ALT  --  14 16  BILITOT  --  0.7 0.6  TRIG  --   --  185*  PREALBUMIN <5*  --  <5*   ASSESSMENT                                                                                                           Glucose - ranged from 147 to 193 upon start of TPN , no hx DM  Electrolytes - wnl, CorrCa  9  Renal - wnl, stable   LFTs -  wnl  TGs - 185 ( 11/25)   Prealbumin - < 5 (11/24), ( 11/25)    PLAN                                                                                                                         At 1800 today:  Increase  custom TPN to 60 ml/hr.  TPN to contain standard  multivitamins and trace elements.  Reduce IVF to 40 ml/hr at 1800  Continue q6h sensitive scale  Novolog coverage Plan to advance TPN as tolerated to the goal rate. TPN lab panels on Mondays & Thursdays. F/u daily.    Adalberto Cole, PharmD, BCPS 07/18/2019 8:39 AM

## 2019-07-18 NOTE — Progress Notes (Signed)
MEDICATION-RELATED CONSULT NOTE   IR Procedure Consult - Anticoagulant/Antiplatelet PTA/Inpatient Med List Review by Pharmacist    Procedure: CT guided placed of a 12 Fr drainage catheter placement into the left lower abdomen/pelvis adjacent to suspected colonic perforation.      Completed: 10:54   Post-Procedural bleeding risk per IR MD assessment:  Standard  Antithrombotic medications on inpatient or PTA profile prior to procedure:    - Enoxaparin 60 mg SQ q12h   Recommended restart time per IR Post-Procedure Guidelines:   Next AM   Other considerations:      Plan:      - Resume enoxaparin 60 mg SQ q12h on 11/26   Royetta Asal, PharmD, BCPS 07/18/2019 11:32 AM

## 2019-07-18 NOTE — Procedures (Signed)
Pre procedural Dx: Perforated diverticulitis Post procedural Dx: Same  Technically successful CT guided placed of a 12 Fr drainage catheter placement into the left lower abdomen/pelvis adjacent to suspected colonic perforation.    Technically successful CT guided placed of a 12 Fr drainage catheter placement into the right mid hemi abdomen.  Technically successful CT guided placed of a 12 Fr drainage catheter placement into the right upper abdominal quadrant.  A total of approximately 45 cc of feculent appearing material was aspirated from all 3 drains and a representative sample was sent to the laboratory for analysis.    EBL: None Complications: None immediate  Ronny Bacon, MD Pager #: [phone]

## 2019-07-18 NOTE — Progress Notes (Signed)
CMT reported pt's HR sustaining in the 30's. Pt asleep at this time. Once awakened pt's HR remains bradycardic in the 40s. Pt states she does not feel any different at this time. Neuro assessment unremarkable. BP 138/74. Rectal temp noted to be 96.6. On call provider made aware of new findings. Warm blankets applied to patient. Will transfer pt to step down unit per MD order if intervention not successful. Will continue to monitor closely.

## 2019-07-18 NOTE — Progress Notes (Signed)
Patient A&O this AM, HR upper 50s while sleeping, Increased to 60s when awakened. All VSS. Oral temp 95.7, but pt states she is a mouth breather and that could be cause of low temp. Rectal temp 98.9. Pt appears well, showing no signs of distress. Does have anxiety and becomes tearful at times, but very appreciative of staff's help and reassurance. Pt did have a watery stool in BSC this AM. Is now downstairs in CT. Will continue to monitor closely.

## 2019-07-19 LAB — COMPREHENSIVE METABOLIC PANEL
ALT: 23 U/L (ref 0–44)
AST: 48 U/L — ABNORMAL HIGH (ref 15–41)
Albumin: 2.1 g/dL — ABNORMAL LOW (ref 3.5–5.0)
Alkaline Phosphatase: 74 U/L (ref 38–126)
Anion gap: 6 (ref 5–15)
BUN: 16 mg/dL (ref 6–20)
CO2: 22 mmol/L (ref 22–32)
Calcium: 7.3 mg/dL — ABNORMAL LOW (ref 8.9–10.3)
Chloride: 105 mmol/L (ref 98–111)
Creatinine, Ser: 0.52 mg/dL (ref 0.44–1.00)
GFR calc Af Amer: >60 mL/min (ref >60)
GFR calc non Af Amer: >60 mL/min (ref >60)
Glucose, Bld: 152 mg/dL — ABNORMAL HIGH (ref 70–99)
Potassium: 4.3 mmol/L (ref 3.5–5.1)
Sodium: 133 mmol/L — ABNORMAL LOW (ref 135–145)
Total Bilirubin: 0.7 mg/dL (ref 0.3–1.2)
Total Protein: 6.3 g/dL — ABNORMAL LOW (ref 6.5–8.1)

## 2019-07-19 LAB — CBC WITH DIFFERENTIAL/PLATELET
Abs Immature Granulocytes: 0.07 10*3/uL (ref 0.00–0.07)
Basophils Absolute: 0 10*3/uL (ref 0.0–0.1)
Basophils Relative: 0 %
Eosinophils Absolute: 0 10*3/uL (ref 0.0–0.5)
Eosinophils Relative: 0 %
HCT: 33.4 % — ABNORMAL LOW (ref 36.0–46.0)
Hemoglobin: 10.4 g/dL — ABNORMAL LOW (ref 12.0–15.0)
Immature Granulocytes: 1 %
Lymphocytes Relative: 7 %
Lymphs Abs: 0.6 10*3/uL — ABNORMAL LOW (ref 0.7–4.0)
MCH: 26.9 pg (ref 26.0–34.0)
MCHC: 31.1 g/dL (ref 30.0–36.0)
MCV: 86.5 fL (ref 80.0–100.0)
Monocytes Absolute: 0.3 10*3/uL (ref 0.1–1.0)
Monocytes Relative: 3 %
Neutro Abs: 7.2 10*3/uL (ref 1.7–7.7)
Neutrophils Relative %: 89 %
Platelets: 457 10*3/uL — ABNORMAL HIGH (ref 150–400)
RBC: 3.86 MIL/uL — ABNORMAL LOW (ref 3.87–5.11)
RDW: 15.9 % — ABNORMAL HIGH (ref 11.5–15.5)
WBC: 8.1 10*3/uL (ref 4.0–10.5)
nRBC: 0 % (ref 0.0–0.2)

## 2019-07-19 LAB — GLUCOSE, CAPILLARY
Glucose-Capillary: 108 mg/dL — ABNORMAL HIGH (ref 70–99)
Glucose-Capillary: 140 mg/dL — ABNORMAL HIGH (ref 70–99)
Glucose-Capillary: 150 mg/dL — ABNORMAL HIGH (ref 70–99)
Glucose-Capillary: 165 mg/dL — ABNORMAL HIGH (ref 70–99)
Glucose-Capillary: 212 mg/dL — ABNORMAL HIGH (ref 70–99)

## 2019-07-19 LAB — C-REACTIVE PROTEIN: CRP: 19.2 mg/dL — ABNORMAL HIGH (ref <1.0)

## 2019-07-19 LAB — FIBRINOGEN: Fibrinogen: 610 mg/dL — ABNORMAL HIGH (ref 210–475)

## 2019-07-19 LAB — MAGNESIUM: Magnesium: 2.1 mg/dL (ref 1.7–2.4)

## 2019-07-19 LAB — D-DIMER, QUANTITATIVE: D-Dimer, Quant: 7.37 ug/mL-FEU — ABNORMAL HIGH (ref 0.00–0.50)

## 2019-07-19 LAB — FERRITIN: Ferritin: 317 ng/mL — ABNORMAL HIGH (ref 11–307)

## 2019-07-19 LAB — PHOSPHORUS: Phosphorus: 2.6 mg/dL (ref 2.5–4.6)

## 2019-07-19 MED ORDER — SODIUM CHLORIDE 0.9 % IV SOLN: INTRAVENOUS | Status: DC

## 2019-07-19 MED ORDER — TRAVASOL 10 % IV SOLN
INTRAVENOUS | Status: DC
  Filled 2019-07-19: qty 1122

## 2019-07-19 MED ORDER — INSULIN ASPART 100 UNIT/ML ~~LOC~~ SOLN
0.0000 [IU] | Freq: Four times a day (QID) | SUBCUTANEOUS | Status: DC
  Administered 2019-07-19 – 2019-07-21 (×7): 3 [IU] via SUBCUTANEOUS
  Administered 2019-07-22 – 2019-07-23 (×4): 2 [IU] via SUBCUTANEOUS

## 2019-07-19 MED ORDER — SODIUM CHLORIDE 0.9 % IV SOLN
INTRAVENOUS | Status: AC
  Administered 2019-07-19: 18:00:00 via INTRAVENOUS

## 2019-07-19 MED ORDER — TRAVASOL 10 % IV SOLN
INTRAVENOUS | Status: AC
  Administered 2019-07-19: 17:00:00 via INTRAVENOUS
  Filled 2019-07-19: qty 792

## 2019-07-19 MED ORDER — DEXAMETHASONE SODIUM PHOSPHATE 10 MG/ML IJ SOLN
6.0000 mg | Freq: Every day | INTRAMUSCULAR | Status: DC
  Administered 2019-07-19 – 2019-07-21 (×3): 6 mg via INTRAVENOUS
  Filled 2019-07-19 (×3): qty 1

## 2019-07-19 MED ORDER — INSULIN ASPART 100 UNIT/ML ~~LOC~~ SOLN: 0.0000 [IU] | SUBCUTANEOUS | Status: DC

## 2019-07-19 MED ORDER — ADULT MULTIVITAMIN W/MINERALS CH
1.0000 | ORAL_TABLET | Freq: Every day | ORAL | Status: DC
  Administered 2019-07-19 – 2019-07-26 (×8): 1 via ORAL
  Filled 2019-07-19 (×8): qty 1

## 2019-07-19 NOTE — Progress Notes (Addendum)
PHARMACY - ADULT TOTAL PARENTERAL NUTRITION CONSULT NOTE   Pharmacy Consult for TPN Indication:  Intra-abd infection  HPI:  23 yoF admit 11/14 with abd pain, acute sigmoid diverticulitis with pneumoperitoneum, ascites  TPN Access: PICC TPN start date: 11/24  Patient Measurements: Estimated body mass index is 48.79 kg/m as calculated from the following:   Height as of this encounter: 5\' 5"  (1.651 m).   Weight as of this encounter: 293 lb 3.4 oz (133 kg).  Estimated Nutritional Needs - Per RD recommendations on 11/24 Kcal:  1995-2260 kcal Protein:  100-113 grams Fluid:  >/= 2 L/day  Rx target rate at 85 ml/hr will yield: 112 gm protein, 2105 kCal per day    Recent Labs    07/17/19 0615  07/18/19 0449 07/19/19 0416  NA  --    < > 136 133*  K  --    < > 3.8 4.3  CL  --    < > 105 105  CO2  --    < > 24 22  GLUCOSE  --    < > 151* 152*  BUN  --    < > 14 16  CREATININE  --    < > 0.58 0.52  CALCIUM  --    < > 7.6* 7.3*  PHOS  --    < > 3.3 2.6  MG  --    < > 2.2 2.1  ALBUMIN  --    < > 2.2* 2.1*  ALKPHOS  --    < > 79 74  AST  --    < > 30 48*  ALT  --    < > 16 23  BILITOT  --    < > 0.6 0.7  TRIG  --   --  185*  --   PREALBUMIN <5*  --  <5*  --    < > = values in this interval not displayed.   ASSESSMENT                                                                                                           Glucose (goal CBG 100-150) - No hx DM, CBGs controlled except for one reading of 212; no low CBGs  4 units SSI yesterday  Decadron 6 mg/d for COVID  Electrolytes - Na slightly low, CorrCa 8.9  Taking PO potassium 20 mEq daily  Renal - wnl, stable; UOP appears incompletely charted  LFTs -  wnl  TGs - 185 (11/25)   Prealbumin - < 5 (11/24), (11/25)  Current nutrition - Advanced to CLD 11/25, looks to be tolerating ~1 pro-stat daily (provides 15 mg protein); refusing all Ensure Max  MIVF - NS w/ 20 mEq K at 40 ml/hr   PLAN  At 1800 today:  Continue custom TPN at 60 ml/hr as diet advanced  Will switch to PO multivitamin/trace elements  Electrolytes: increase Na, Ca, Phos, others unchanged; Cl:Ac = 1:1;  OK to continue PO Kdur for now  Remove KCl from IVF  Continue q6h SSI; will advance to moderate scale with decadron ordered  TPN lab panels on Mondays & Thursdays  Bmet, Phos tomorrow    Bernadene Person, PharmD, BCPS [phone] 07/19/2019, 8:56 AM

## 2019-07-19 NOTE — Progress Notes (Signed)
Subjective: No complaints  Objective: Vital signs in last 24 hours: Temp:  [97.9 F (36.6 C)-99.3 F (37.4 C)] 97.9 F (36.6 C) (11/26 0509) Pulse Rate:  [51-88] 51 (11/26 0509) Resp:  [20-32] 20 (11/26 0509) BP: (140-259)/(53-127) 155/74 (11/26 0509) SpO2:  [85 %-98 %] 93 % (11/26 0509)  Intake/Output from previous day: 11/25 0701 - 11/26 0700 In: 2653.2 [P.O.:1330; I.V.:736; IV Piggyback:562.2] Out: 1590 [Urine:1300; Drains:290] Intake/Output this shift: Total I/O In: 975.6 [I.V.:645; IV Piggyback:330.6] Out: -   General appearance: alert and cooperative Resp: clear to auscultation bilaterally Cardio: regular rate and rhythm GI: mild LLQ tenderness. no peritonitis  Lab Results: Recent Labs    07/18/19 0449 07/19/19 0416  WBC 12.2* 8.1  HGB 10.6* 10.4*  HCT 32.8* 33.4*  PLT 372 457*   BMET Recent Labs    07/18/19 0449 07/19/19 0416  NA 136 133*  K 3.8 4.3  CL 105 105  CO2 24 22  GLUCOSE 151* 152*  BUN 14 16  CREATININE 0.58 0.52  CALCIUM 7.6* 7.3*    Studies/Results: Ct Angio Chest Pe W Or Wo Contrast  Result Date: 07/16/2019 CLINICAL DATA:  Acute onset of shortness of breath today EXAM: CT ANGIOGRAPHY CHEST WITH CONTRAST TECHNIQUE: Multidetector CT imaging of the chest was performed using the standard protocol during bolus administration of intravenous contrast. Multiplanar CT image reconstructions and MIPs were obtained to evaluate the vascular anatomy. CONTRAST:  80mL OMNIPAQUE IOHEXOL 350 MG/ML SOLN COMPARISON:  Chest x-ray 07/07/2019 FINDINGS: Cardiovascular: Satisfactory opacification of the pulmonary arteries to the segmental level. No evidence of pulmonary embolism. Normal heart size. Tiny pericardial effusion. Mediastinum/Nodes: No enlarged mediastinal, hilar, or axillary lymph nodes. Thyroid gland, trachea, and esophagus demonstrate no significant findings. Lungs/Pleura: Patient has extensive patchy bilateral pulmonary infiltrates primarily in the  upper lobes. Small bilateral pleural effusions, slightly greater on the left. Slight atelectasis in the right middle lobe and in the right lower lobe. Upper Abdomen: Free air under the right hemidiaphragm. See CT scan of the abdomen report for further information. Musculoskeletal: No chest wall abnormality. No acute or significant osseous findings. Review of the MIP images confirms the above findings. IMPRESSION: 1. No pulmonary emboli. 2. Extensive patchy bilateral pulmonary infiltrates primarily in the upper lobes. 3. Small bilateral pleural effusions, slightly greater on the left. 4. Free air under the right hemidiaphragm. See CT scan of the abdomen report for further information. Electronically Signed   By: Francene Boyers M.D.   On: 07/16/2019 21:25   Ct Abdomen Pelvis W Contrast  Result Date: 07/16/2019 CLINICAL DATA:  Abdominal pain and recurrent fever EXAM: CT ABDOMEN AND PELVIS WITH CONTRAST TECHNIQUE: Multidetector CT imaging of the abdomen and pelvis was performed using the standard protocol following bolus administration of intravenous contrast. CONTRAST:  OMNIPAQUE IOHEXOL 300 MG/ML  SOLN COMPARISON:  07/11/2019 FINDINGS: Lower chest: Bilateral small effusions are noted which have increased somewhat in the interval from the prior exam. Patchy infiltrate is again identified in the lung bases but has increased somewhat from the prior exams particularly in the upper lobes bilaterally. Hepatobiliary: The liver is within normal limits. The gallbladder is well distended with multiple gallstones within. Pancreas: Unremarkable. No pancreatic ductal dilatation or surrounding inflammatory changes. Spleen: Normal in size without focal abnormality. Adrenals/Urinary Tract: Adrenal glands are within normal limits. Kidneys demonstrate a normal enhancement pattern bilaterally. Delayed images demonstrate normal excretion without obstructive change. The bladder is partially distended. Stomach/Bowel: There again  noted changes consistent with diverticular  disease throughout the colon. Adjacent to the sigmoid colon however in an area of previous inflammation there is now an air-fluid collection that measures at least 7.8 x 4.3 cm in dimension within air-fluid level within consistent with a pericolonic abscess. Additionally there are findings along the ascending colon suggestive of rupture of the ascending colon with increase in intraperitoneal air. A large air fluid collection is identified in the right mid abdomen which measures at least 13 by 7 cm in dimension best seen on image number 65 of series 2. This lies adjacent to the inflamed ascending colon and is also consistent with a pericolonic abscess. Additionally communicating with this large collection is a third air-fluid collection which lies along the lateral aspect of the right lobe of the liver causing mass effect upon the adjacent liver measuring 15 x 6.2 cm in greatest dimension. The appendix is within normal limits. The small bowel is also unremarkable. The stomach is within normal limits. Vascular/Lymphatic: No significant vascular findings are present. No enlarged abdominal or pelvic lymph nodes. Reproductive: Uterus and bilateral adnexa are unremarkable. Other: No abdominal wall hernia or abnormality. No abdominopelvic ascites. Musculoskeletal: Degenerative changes of the lumbar spine are seen. IMPRESSION: There are changes consistent with progressive inflammatory change involving the colon primarily within the ascending colon in an area where prior small pericolonic air collections were noted. This results in a large somewhat bilobed air-fluid collection which extends along the lateral aspect of the liver as well as anterior to the ascending colon. It is possible the collection anterior to the ascending colon is dilatation of the ascending colon although felt to be less likely given the overall appearance. A third collection is noted along the course of the  sigmoid colon consistent with increasing pericolonic abscess as described. Bilateral pleural effusions which have increased in the interval from the prior exam. Additionally increasing patchy infiltrate is noted throughout both lungs primarily within the bases although now seen within the upper lobes bilaterally. These results will be called to the ordering clinician or representative by the Radiologist Assistant, and communication documented in the PACS or zVision Dashboard. Electronically Signed   By: Alcide Clever M.D.   On: 07/16/2019 19:49   Ct Abdomen Pelvis W Contrast  Addendum Date: 07/11/2019   ADDENDUM REPORT: 07/11/2019 20:01 ADDENDUM: Critical Value/emergent results were called by telephone at the time of interpretation on 07/11/2019 at 8:01 pm to providerDR. Romie Levee , who verbally acknowledged these results. Electronically Signed   By: Charlett Nose M.D.   On: 07/11/2019 20:01   Result Date: 07/11/2019 CLINICAL DATA:  Abdominal pain EXAM: CT ABDOMEN AND PELVIS WITH CONTRAST TECHNIQUE: Multidetector CT imaging of the abdomen and pelvis was performed using the standard protocol following bolus administration of intravenous contrast. CONTRAST:  OMNIPAQUE IOHEXOL 300 MG/ML SOLN, 30mL OMNIPAQUE IOHEXOL 300 MG/ML SOLN COMPARISON:  07/07/2019 FINDINGS: Lower chest: Trace bilateral pleural effusions. Bibasilar atelectasis. Heart is normal size. Hepatobiliary: Gallstones within the gallbladder. No focal hepatic abnormality. Pancreas: No focal abnormality or ductal dilatation. Spleen: No focal abnormality.  Normal size. Adrenals/Urinary Tract: No adrenal abnormality. No focal renal abnormality. No stones or hydronephrosis. Urinary bladder is unremarkable. Stomach/Bowel: Extensive colonic diverticulosis. Stranding/inflammation around the sigmoid colon has progressed since prior study. There is also abnormal stranding along the anterior aspect of the ascending colon which is new since prior study.  Increasing pneumoperitoneum with locules of gas throughout the peritoneum. Is difficult to determine if the source is the sigmoid colon or the  ascending colon. Appendix is normal. Stomach and small bowel decompressed. Vascular/Lymphatic: No evidence of aneurysm or adenopathy. Reproductive: Uterus and adnexa unremarkable.  No mass. Other: Small amount of perihepatic ascites. Musculoskeletal: No acute bony abnormality. IMPRESSION: Diffuse colonic diverticulosis. Worsening inflammation around the sigmoid colon with new inflammation along the anterior wall of the ascending colon. There is increasing pneumoperitoneum throughout the abdomen and pelvis along with new perihepatic ascites. Findings compatible with perforation. It is difficult to determine exact source of free air. The majority of the free intraperitoneal air is centered anterior to the ascending colon and liver suggesting the possibility that the ascending colon is the source. Cholelithiasis. Trace bilateral effusions.  Bibasilar atelectasis. Electronically Signed: By: Rolm Baptise M.D. On: 07/11/2019 19:32   Ct Abdomen Pelvis W Contrast  Result Date: 07/07/2019 CLINICAL DATA:  Abdominal pain diarrhea EXAM: CT ABDOMEN AND PELVIS WITH CONTRAST TECHNIQUE: Multidetector CT imaging of the abdomen and pelvis was performed using the standard protocol following bolus administration of intravenous contrast. CONTRAST:  136mL OMNIPAQUE IOHEXOL 300 MG/ML  SOLN COMPARISON:  None. FINDINGS: Lower chest: Lung bases demonstrate no acute consolidation or effusion. The heart size is within normal limits. Hepatobiliary: No focal hepatic abnormality or biliary dilatation. Increased intraluminal density within the gallbladder. Pancreas: Unremarkable. No pancreatic ductal dilatation or surrounding inflammatory changes. Spleen: Normal in size without focal abnormality. Adrenals/Urinary Tract: Adrenal glands are unremarkable. Kidneys are normal, without renal calculi, focal  lesion, or hydronephrosis. Bladder is unremarkable. Stomach/Bowel: The stomach is nonenlarged. No dilated small bowel. Nondilated appendix. Hyperdense focus at the cecum near the origin of the appendix, possible diverticulum versus small appendicoliths but no evidence for appendiceal inflammatory change. Fluid within the colon. Sigmoid colon diverticular disease with wall thickening and inflammatory change consistent with acute diverticulitis. Multiple foci of extraluminal gas at the inflamed sigmoid colon. Vascular/Lymphatic: Nonaneurysmal aorta. No significantly enlarged lymph nodes. Reproductive: Uterus and bilateral adnexa are unremarkable. Other: Small foci of extraluminal gas within the right lower quadrant adjacent to the inflamed sigmoid colon. Tiny foci of gas within the right upper quadrant and overlying the dome of the liver. Musculoskeletal: No acute or suspicious osseous abnormality. There are degenerative changes. IMPRESSION: 1. Focal inflammatory changes and wall thickening involving the distal sigmoid colon consistent with acute diverticulitis. Multiple small foci of gas adjacent to the inflamed sigmoid colon with small foci of free air adjacent to the liver, consistent with perforation. Negative for organized abscess at this time. 2. Increased intraluminal density within the gallbladder, possible stones or sludge. Critical Value/emergent results were called by telephone at the time of interpretation on 07/07/2019 at 11:12 pm to [place] , who verbally acknowledged these results. Electronically Signed   By: Donavan Foil M.D.   On: 07/07/2019 23:12   Dg Chest Portable 1 View  Result Date: 07/07/2019 CLINICAL DATA:  Fever, recent COVID exposure. EXAM: PORTABLE CHEST 1 VIEW COMPARISON:  04/24/2013 FINDINGS: The heart size and mediastinal contours are within normal limits. Both lungs are clear. The visualized skeletal structures are unremarkable. IMPRESSION: No active disease.  Electronically Signed   By: Rolm Baptise M.D.   On: 07/07/2019 19:45   Ct Image Guided Drainage By Percutaneous Catheter  Result Date: 07/18/2019 INDICATION: Colonic perforation of indeterminate source now with multiple intra-abdominal mixed fluid though predominantly air containing collections within the abdomen. Active COVID-19 infection. Please perform CT-guided percutaneous drainage catheter(s) placement(s) for infection source control purposes. EXAM: CT IMAGE GUIDED DRAINAGE BY PERCUTANEOUS CATHETER x2 COMPARISON:  CT  abdomen pelvis - 07/11/2019; 07/07/2019 MEDICATIONS: The patient is currently admitted to the hospital and receiving intravenous antibiotics. The antibiotics were administered within an appropriate time frame prior to the initiation of the procedure. ANESTHESIA/SEDATION: Moderate (conscious) sedation was employed during this procedure. A total of Versed 6 mg and Fentanyl 300 mcg was administered intravenously. Moderate Sedation Time: 50 minutes. The patient's level of consciousness and vital signs were monitored continuously by radiology nursing throughout the procedure under my direct supervision. CONTRAST:  None COMPLICATIONS: None immediate. PROCEDURE: Informed written consent was obtained from the patient after a discussion of the risks, benefits and alternatives to treatment. The patient was placed supine on the CT gantry and a pre procedural CT was performed re-demonstrating the known abscesses/air and fluid collections within the with dominant collection within the right upper abdominal quadrant measuring approximately 14.8 x 6.3 cm (image 50, series 2) dominant predominantly air collection within right lower quadrant measuring approximately 15.5 x 8.3 cm (image 72, series 2) and dominant mixed and air and fluid containing collection within the midline of the lower abdomen/pelvis measuring approximately 9.7 x 5.1 cm (image 97, series 2). The procedure was planned. A timeout was  performed prior to the initiation of the procedure. The right upper and mid abdomen as well as the left lower abdomen was was prepped and draped in the usual sterile fashion. The overlying soft tissues were anesthetized with 1% lidocaine with epinephrine. 18 gauge trocar needles were advanced into each fluid collection in short Amplatz wires were coiled within each separate collection. Next, sequentially, each track was serially dilated allowing placement of 12 French percutaneous drainage catheters under intermittent CT fluoroscopic guidance. Completion CT imaging was obtained and drainage catheters were retracted into the dominant component of each air and fluid containing collection. Postprocedural CT imaging was obtained. A total of approximately 45 cc of feculent appearing fluid was aspirated from all 3 drains, with the largest amount aspirated from the left lower quadrant percutaneous drainage catheter. The drainage catheter within the left lower abdominal quadrant was connected to a gravity bag while the other 2 percutaneous drainage catheters were connected to JP bulbs. All drainage catheters were secured at the skin entrance site within interrupted sutures and Stat Lock devices. Dressings were applied. The patient tolerated the procedure well without immediate postprocedural complication. IMPRESSION: Technically successful CT-guided placement of 3 separate percutaneous drainage catheters into dominant mixed fluid though predominantly air containing collections within the right upper and mid abdomen as well as the left lower abdominal quadrant. Electronically Signed   By: Simonne ComeJohn  Watts M.D.   On: 07/18/2019 13:05   Ct Image Guided Drainage By Percutaneous Catheter  Result Date: 07/18/2019 INDICATION: Colonic perforation of indeterminate source now with multiple intra-abdominal mixed fluid though predominantly air containing collections within the abdomen. Active COVID-19 infection. Please perform  CT-guided percutaneous drainage catheter(s) placement(s) for infection source control purposes. EXAM: CT IMAGE GUIDED DRAINAGE BY PERCUTANEOUS CATHETER x2 COMPARISON:  CT abdomen pelvis - 07/11/2019; 07/07/2019 MEDICATIONS: The patient is currently admitted to the hospital and receiving intravenous antibiotics. The antibiotics were administered within an appropriate time frame prior to the initiation of the procedure. ANESTHESIA/SEDATION: Moderate (conscious) sedation was employed during this procedure. A total of Versed 6 mg and Fentanyl 300 mcg was administered intravenously. Moderate Sedation Time: 50 minutes. The patient's level of consciousness and vital signs were monitored continuously by radiology nursing throughout the procedure under my direct supervision. CONTRAST:  None COMPLICATIONS: None immediate. PROCEDURE: Informed written consent  was obtained from the patient after a discussion of the risks, benefits and alternatives to treatment. The patient was placed supine on the CT gantry and a pre procedural CT was performed re-demonstrating the known abscesses/air and fluid collections within the with dominant collection within the right upper abdominal quadrant measuring approximately 14.8 x 6.3 cm (image 50, series 2) dominant predominantly air collection within right lower quadrant measuring approximately 15.5 x 8.3 cm (image 72, series 2) and dominant mixed and air and fluid containing collection within the midline of the lower abdomen/pelvis measuring approximately 9.7 x 5.1 cm (image 97, series 2). The procedure was planned. A timeout was performed prior to the initiation of the procedure. The right upper and mid abdomen as well as the left lower abdomen was was prepped and draped in the usual sterile fashion. The overlying soft tissues were anesthetized with 1% lidocaine with epinephrine. 18 gauge trocar needles were advanced into each fluid collection in short Amplatz wires were coiled within each  separate collection. Next, sequentially, each track was serially dilated allowing placement of 12 French percutaneous drainage catheters under intermittent CT fluoroscopic guidance. Completion CT imaging was obtained and drainage catheters were retracted into the dominant component of each air and fluid containing collection. Postprocedural CT imaging was obtained. A total of approximately 45 cc of feculent appearing fluid was aspirated from all 3 drains, with the largest amount aspirated from the left lower quadrant percutaneous drainage catheter. The drainage catheter within the left lower abdominal quadrant was connected to a gravity bag while the other 2 percutaneous drainage catheters were connected to JP bulbs. All drainage catheters were secured at the skin entrance site within interrupted sutures and Stat Lock devices. Dressings were applied. The patient tolerated the procedure well without immediate postprocedural complication. IMPRESSION: Technically successful CT-guided placement of 3 separate percutaneous drainage catheters into dominant mixed fluid though predominantly air containing collections within the right upper and mid abdomen as well as the left lower abdominal quadrant. Electronically Signed   By: Simonne Come M.D.   On: 07/18/2019 13:05   Ct Image Guided Drainage By Percutaneous Catheter  Result Date: 07/18/2019 INDICATION: Colonic perforation of indeterminate source now with multiple intra-abdominal mixed fluid though predominantly air containing collections within the abdomen. Active COVID-19 infection. Please perform CT-guided percutaneous drainage catheter(s) placement(s) for infection source control purposes. EXAM: CT IMAGE GUIDED DRAINAGE BY PERCUTANEOUS CATHETER x2 COMPARISON:  CT abdomen pelvis - 07/11/2019; 07/07/2019 MEDICATIONS: The patient is currently admitted to the hospital and receiving intravenous antibiotics. The antibiotics were administered within an appropriate time  frame prior to the initiation of the procedure. ANESTHESIA/SEDATION: Moderate (conscious) sedation was employed during this procedure. A total of Versed 6 mg and Fentanyl 300 mcg was administered intravenously. Moderate Sedation Time: 50 minutes. The patient's level of consciousness and vital signs were monitored continuously by radiology nursing throughout the procedure under my direct supervision. CONTRAST:  None COMPLICATIONS: None immediate. PROCEDURE: Informed written consent was obtained from the patient after a discussion of the risks, benefits and alternatives to treatment. The patient was placed supine on the CT gantry and a pre procedural CT was performed re-demonstrating the known abscesses/air and fluid collections within the with dominant collection within the right upper abdominal quadrant measuring approximately 14.8 x 6.3 cm (image 50, series 2) dominant predominantly air collection within right lower quadrant measuring approximately 15.5 x 8.3 cm (image 72, series 2) and dominant mixed and air and fluid containing collection within the midline of the  lower abdomen/pelvis measuring approximately 9.7 x 5.1 cm (image 97, series 2). The procedure was planned. A timeout was performed prior to the initiation of the procedure. The right upper and mid abdomen as well as the left lower abdomen was was prepped and draped in the usual sterile fashion. The overlying soft tissues were anesthetized with 1% lidocaine with epinephrine. 18 gauge trocar needles were advanced into each fluid collection in short Amplatz wires were coiled within each separate collection. Next, sequentially, each track was serially dilated allowing placement of 12 French percutaneous drainage catheters under intermittent CT fluoroscopic guidance. Completion CT imaging was obtained and drainage catheters were retracted into the dominant component of each air and fluid containing collection. Postprocedural CT imaging was obtained. A total  of approximately 45 cc of feculent appearing fluid was aspirated from all 3 drains, with the largest amount aspirated from the left lower quadrant percutaneous drainage catheter. The drainage catheter within the left lower abdominal quadrant was connected to a gravity bag while the other 2 percutaneous drainage catheters were connected to JP bulbs. All drainage catheters were secured at the skin entrance site within interrupted sutures and Stat Lock devices. Dressings were applied. The patient tolerated the procedure well without immediate postprocedural complication. IMPRESSION: Technically successful CT-guided placement of 3 separate percutaneous drainage catheters into dominant mixed fluid though predominantly air containing collections within the right upper and mid abdomen as well as the left lower abdominal quadrant. Electronically Signed   By: Simonne Come M.D.   On: 07/18/2019 13:05   [place] Ekg Site Rite  Result Date: 07/17/2019 If Site Rite image not attached, placement could not be confirmed due to current cardiac rhythm.  Anti-infectives: Anti-infectives (From admission, onward)   Start     Dose/Rate Route Frequency Ordered Stop   07/19/19 1000  remdesivir 100 mg in sodium chloride 0.9 % 250 mL IVPB     100 mg 500 mL/hr over 30 Minutes Intravenous Daily 07/17/19 1822 07/22/19 0959   07/18/19 1600  remdesivir 100 mg in sodium chloride 0.9 % 250 mL IVPB     100 mg 500 mL/hr over 30 Minutes Intravenous  Once 07/17/19 1822 07/18/19 1615   07/18/19 0600  vancomycin (VANCOCIN) 1,250 mg in sodium chloride 0.9 % 250 mL IVPB     1,250 mg 166.7 mL/hr over 90 Minutes Intravenous Every 12 hours 07/17/19 1055     07/17/19 1830  remdesivir 200 mg in sodium chloride 0.9 % 250 mL IVPB     200 mg 500 mL/hr over 30 Minutes Intravenous Once 07/17/19 1822 07/17/19 2314   07/17/19 1100  vancomycin (VANCOCIN) 2,500 mg in sodium chloride 0.9 % 500 mL IVPB     2,500 mg 250 mL/hr over 120 Minutes Intravenous   Once 07/17/19 1004 07/17/19 2037   07/08/19 0600  piperacillin-tazobactam (ZOSYN) IVPB 3.375 g     3.375 g 12.5 mL/hr over 240 Minutes Intravenous Every 8 hours 07/08/19 0129     07/07/19 2315  piperacillin-tazobactam (ZOSYN) IVPB 3.375 g     3.375 g 100 mL/hr over 30 Minutes Intravenous  Once 07/07/19 2314 07/07/19 2356      Assessment/Plan: s/p * No surgery found * Continue abx and drain for perforated diverticulitis  Covid positive: 07/17/2019 (Covid negative: 07/07/2019) Hx IBS/questionable Crohn's disease AKI Hypertension Fibromyalgia-generalized Back,hip,and leg pain Hx PE-off anticoagulants Tobacco use EtOH use Morbid obesity-BMI 48.79 Extensive bilateral patchy pulmonary infiltrates primarlyupper lobes; small bilateral effusions - Medicine consult Severe malnutrition - prealbumin <5  Acute sigmoid diverticulitis -CT 07/11/2019: Diffuse colonic diverticulosis, worsening inflammation around the sigmoid colon with new inflammation along the anterior wall of the ascending colon increased pneumoperitoneum throughout the abdomen pelvis along with perihepatic ascites.  -Fever 11/21 and 11/23 -Repeat CT 11/23: Diverticular disease with 7.8 x 4.3 cm air-fluid collection; ascending colon air/fluid collection 13 x 7 cm also consistent with possible pericolonic abscess, a third collection lateral aspect the right lobe of the liver measuring 15 x 6.2 causing mass-effect upon the liver. -CT also showed bilateral pleural effusions which have increased since the last CT. Additional patchy infiltrate noted throughout both lungs primarily within the bases although now seen in the upper lobes bilaterally. -CT of the chest 11/23: No pulmonary emboli, extensive patchy bilateral pulmonary infiltrates primarily in the upper lobes. Small bilateral pleural effusions left greater than right free air under the right hemidiaphragm - Plan IR drain placement 11/25   FEN: IV  fluids/clear liquids ID: [id number] DVT: SCDs only;Lovenox Follow-up: TBD   Plan:  I have ask [facility] to consider transferring her to their service as Primary due to her COVID.  We will continue to follow and assist with the diverticulitis.  Continue drains and abx, I will put her back on clears.   LOS: 11 days   Chevis Pretty III 07/19/2019, 9:02 AM

## 2019-07-19 NOTE — Progress Notes (Addendum)
PROGRESS NOTE                                                                                                                                                                                                             Patient Demographics:    Holly Moon, is a 48 y.o. female, DOB - 22-Sep-1970, ZOX:[id number]  Admit date - 07/07/2019   Admitting Physician Md Montez Morita, MD  Outpatient Primary MD for the patient is [name], [place]  LOS - 11    Chief Complaint  Patient presents with   Abdominal Pain   Near Syncope   Fever   Diarrhea       Brief Narrative 48 year old morbidly obese female with essential hypertension, fibromyalgia, history of PE off anticoagulation, alcohol and tobacco use who is admitted to surgical service with complicated acute sigmoid diverticulitis with developing fluid collection and abscess.  Abdominal CT from 11/18 showed diffuse colon diverticulosis with worsening inflammation around sigmoid colon and increased pneumoperitoneum with perihepatic ascites.  Repeat CT on 11/23 with possible abscesses.  Patient on empiric IV Zosyn and IR consulted for image guided drainage.  Hospitalist consulted for intermittent fever with new bilateral upper lobe infiltrate with effusion, increasing leukocytosis.  On 11/23 rapid response was called due to fever of 103F with tachycardia and tachypnea.  CT angiogram of the chest was negative for PE but showed extensive patchy bilateral pulmonary infiltrate in the upper lobes with small bilateral pleural effusion (L >R) reportedly patient had history of Covid in the family about 1 week prior to hospitalization.  Covid PCR test were checked on admission on 11/14.  Given her acute respiratory symptoms COVID-19 was tested again and came back positive.  Along with that she was found to have elevated D-dimer of 9.1, CRP of 30, fibrinogen of 700 and normal ferritin.  Patient  placed on airborne and contact precautions and started on empiric Decadron and remdesivir.    Subjective:   Patient reports off-and-on abdominal pain.  Also reports having anxiety with her current illness.  Denies any difficulty breathing.  No overnight events.  Assessment  & Plan :   Principal problem Severe sepsis with acute hypoxic respiratory failure (HCC) Secondary to new COVID-19 infection diagnosed during hospital stay. Elevated inflammatory markers including CRP, D-dimer, LDH, fibrinogen and procalcitonin, continue to trend down. Continue IV Decadron  and remdesivir.  Given her active sigmoid diverticulitis with abscess Actemra has not been given. Sats stable on room air.   Active symptoms Perforated sigmoid diverticulitis with abscess On 11/25, underwent CT-guided 12 French drainage catheter placement in the left lower abdomen/pelvis adjacent to the suspected colonic perforation with about 45 cc feculent material was aspirated and sent for microscopy. Continue empiric IV Zosyn.  Will check MRSA PCR and if negative DC vancomycin.  Further management per surgery. Currently on clear liquid supplemented with TPN.  Reports having small bowel movement today.  Essential hypertension Continue ACE inhibitor.  History of fibromyalgia Continue Topamax and Zanaflex.  Morbid obesity  History of tobacco and alcohol use No signs of withdrawal.  Patient reported quitting smoking 10 days prior to admission.   Code Status : Full code  Family Communication  : Husband updated on the phone  Disposition Plan  : Pending hospital course  Barriers For Discharge : Active symptoms  Consults  : CCS, IR  Procedures  : CT abdomen pelvis, CT angiogram of the chest, IR drainage of sigmoid abscess with perforation  DVT Prophylaxis  :  Lovenox  Lab Results  Component Value Date   PLT 457 (H) 07/19/2019    Antibiotics  :    Anti-infectives (From admission, onward)   Start     Dose/Rate  Route Frequency Ordered Stop   07/19/19 1000  remdesivir 100 mg in sodium chloride 0.9 % 250 mL IVPB     100 mg 500 mL/hr over 30 Minutes Intravenous Daily 07/17/19 1822 07/22/19 0959   07/18/19 1600  remdesivir 100 mg in sodium chloride 0.9 % 250 mL IVPB     100 mg 500 mL/hr over 30 Minutes Intravenous  Once 07/17/19 1822 07/18/19 1615   07/18/19 0600  vancomycin (VANCOCIN) 1,250 mg in sodium chloride 0.9 % 250 mL IVPB     1,250 mg 166.7 mL/hr over 90 Minutes Intravenous Every 12 hours 07/17/19 1055     07/17/19 1830  remdesivir 200 mg in sodium chloride 0.9 % 250 mL IVPB     200 mg 500 mL/hr over 30 Minutes Intravenous Once 07/17/19 1822 07/17/19 2314   07/17/19 1100  vancomycin (VANCOCIN) 2,500 mg in sodium chloride 0.9 % 500 mL IVPB     2,500 mg 250 mL/hr over 120 Minutes Intravenous  Once 07/17/19 1004 07/17/19 2037   07/08/19 0600  piperacillin-tazobactam (ZOSYN) IVPB 3.375 g     3.375 g 12.5 mL/hr over 240 Minutes Intravenous Every 8 hours 07/08/19 0129     07/07/19 2315  piperacillin-tazobactam (ZOSYN) IVPB 3.375 g     3.375 g 100 mL/hr over 30 Minutes Intravenous  Once 07/07/19 2314 07/07/19 2356        Objective:   Vitals:   07/19/19 0107 07/19/19 0509 07/19/19 1100 07/19/19 1203  BP: 140/78 (!) 155/74    Pulse: (!) 54 (!) 51    Resp:  20    Temp:  97.9 F (36.6 C)    TempSrc:  Oral    SpO2: 95% 93% (!) 85% 93%  Weight:      Height:        Wt Readings from Last 3 Encounters:  07/07/19 133 kg  02/17/18 (!) 155.1 kg  11/07/16 127 kg     Intake/Output Summary (Last 24 hours) at 07/19/2019 1255 Last data filed at 07/19/2019 1000 Gross per 24 hour  Intake 3568.85 ml  Output 1505 ml  Net 2063.85 ml    Physical exam  Middle-aged obese female not in distress HEENT: Moist mucosa, supple neck Chest: Clear CVs: Normal S1-S2 GI: Soft, nondistended, bowel sounds present, drain over the right lower quadrant with purulent output Musculoskeletal: Warm, no  edema       Data Review:    CBC Recent Labs  Lab 07/15/19 0545 07/16/19 0548 07/17/19 0615 07/18/19 0449 07/19/19 0416  WBC 13.3* 11.5* 16.7* 12.2* 8.1  HGB 11.5* 12.2 11.0* 10.6* 10.4*  HCT 37.3 40.1 35.1* 32.8* 33.4*  PLT 460* 446* 366 372 457*  MCV 90.3 90.9 88.4 86.1 86.5  MCH 27.8 27.7 27.7 27.8 26.9  MCHC 30.8 30.4 31.3 32.3 31.1  RDW 15.7* 16.1* 15.9* 15.6* 15.9*  LYMPHSABS  --   --   --  0.7 0.6*  MONOABS  --   --   --  0.3 0.3  EOSABS  --   --   --  0.0 0.0  BASOSABS  --   --   --  0.0 0.0    Chemistries  Recent Labs  Lab 07/14/19 0602 07/16/19 1433 07/17/19 1034 07/18/19 0449 07/19/19 0416  NA 139 135 135 136 133*  K 3.7 3.9 3.6 3.8 4.3  CL 109 105 104 105 105  CO2 23 20* GLUCOSE 92 93 83 151* 152*  BUN CREATININE 0.74 0.69 0.72 0.58 0.52  CALCIUM 8.0* 8.0* 7.7* 7.6* 7.3*  MG  --   --  2.1 2.2 2.1  AST  --  48*  ALT  --  ALKPHOS  --  94 87 79 74  BILITOT  --  0.4 0.7 0.6 0.7   ------------------------------------------------------------------------------------------------------------------ Recent Labs    07/18/19 0449  TRIG 185*    No results found for: HGBA1C ------------------------------------------------------------------------------------------------------------------ No results for input(s): TSH, T4TOTAL, T3FREE, THYROIDAB in the last 72 hours.  Invalid input(s): FREET3 ------------------------------------------------------------------------------------------------------------------ Recent Labs    07/18/19 0449 07/19/19 0419  FERRITIN 281 317*    Coagulation profile Recent Labs  Lab 07/17/19 1601 07/18/19 0449  INR 1.5* 1.5*    Recent Labs    07/18/19 0449 07/19/19 0416  DDIMER 6.41* 7.37*    Cardiac Enzymes No results for input(s): CKMB, TROPONINI, MYOGLOBIN in the last 168 hours.  Invalid input(s):  CK ------------------------------------------------------------------------------------------------------------------ No results found for: BNP  Inpatient Medications  Scheduled Meds:  acetaminophen  1,000 mg Oral Q6H   benazepril  20 mg Oral Daily   busPIRone  30 mg Oral BID   Chlorhexidine Gluconate Cloth  6 each Topical Daily   desvenlafaxine  50 mg Oral Daily   dexamethasone (DECADRON) injection  6 mg Intravenous Daily   enoxaparin (LOVENOX) injection  60 mg Subcutaneous Q12H   feeding supplement (PRO-STAT SUGAR FREE 64)  30 mL Oral BID   gabapentin  300 mg Oral TID   hydrALAZINE  5 mg Intravenous Once   insulin aspart  0-15 Units Subcutaneous Q6H   multivitamin with minerals  1 tablet Oral Daily   pneumococcal 23 valent vaccine  0.5 mL Intramuscular Tomorrow-1000   potassium chloride  20 mEq Oral BID   Ensure Max Protein  11 oz Oral BID   sodium chloride flush  5 mL Intracatheter Q8H   tiZANidine  4 mg Oral QHS   topiramate  100 mg Oral Daily   Continuous Infusions:  sodium chloride     0.9 % NaCl with KCl 20 mEq / L 40 mL/hr at  07/18/19 1816   piperacillin-tazobactam (ZOSYN)  IV 3.375 g (07/19/19 1242)   remdesivir 100 mg in NS 250 mL 100 mg (07/19/19 1028)   TPN ADULT (ION) 60 mL/hr at 07/18/19 1713   TPN ADULT (ION)     vancomycin 1,250 mg (07/19/19 0623)   PRN Meds:.ALPRAZolam, alum & mag hydroxide-simeth, diphenhydrAMINE **OR** diphenhydrAMINE, HYDROmorphone (DILAUDID) injection, lip balm, methocarbamol, ondansetron **OR** ondansetron (ZOFRAN) IV, oxyCODONE, simethicone, sodium chloride flush  Micro Results Recent Results (from the past 240 hour(s))  SARS CORONAVIRUS 2 (TAT 6-24 HRS) Nasopharyngeal Nasopharyngeal Swab     Status: Abnormal   Collection Time: 07/17/19  8:14 AM   Specimen: Nasopharyngeal Swab  Result Value Ref Range Status   SARS Coronavirus 2 POSITIVE (A) NEGATIVE Final    Comment: RESULT CALLED TO, READ BACK BY AND  VERIFIED WITH: Cory Munch RN 15:15 07/17/19 ([email or web address]) (NOTE) SARS-CoV-2 target nucleic acids are DETECTED. The SARS-CoV-2 RNA is generally detectable in upper and lower respiratory specimens during the acute phase of infection. Positive results are indicative of the presence of SARS-CoV-2 RNA. Clinical correlation with patient history and other diagnostic information is  necessary to determine patient infection status. Positive results do not rule out bacterial infection or co-infection with other viruses.  The expected result is Negative. Fact Sheet for Patients: HairSlick.no Fact Sheet for Healthcare Providers: quierodirigir.com This test is not yet approved or cleared by the [place] FDA and  has been authorized for detection and/or diagnosis of SARS-CoV-2 by FDA under an Emergency Use Authorization (EUA). This EUA will remain  in effect (meaning this test can be used) for t he duration of the COVID-19 declaration under Section 564(b)(1) of the Act, 21 U.S.C. section 360bbb-3(b)(1), unless the authorization is terminated or revoked sooner. Performed at [facility], [address] [place], [place] [address]   Culture, blood (routine x 2)     Status: None (Preliminary result)   Collection Time: 07/17/19 10:34 AM   Specimen: BLOOD  Result Value Ref Range Status   Specimen Description   Final    BLOOD RIGHT ARM Performed at [facility], [address] [place], [place] [address]    Special Requests   Final    BOTTLES DRAWN AEROBIC ONLY Blood Culture adequate volume Performed at [facility], [address] [place], [place] [address]    Culture   Final    NO GROWTH 2 DAYS Performed at [facility], [address], [place], [place] [address]    Report Status PENDING  Incomplete  Culture, blood (routine x 2)     Status: None (Preliminary result)   Collection Time:  07/17/19  4:00 PM   Specimen: BLOOD  Result Value Ref Range Status   Specimen Description   Final    BLOOD RIGHT ANTECUBITAL Performed at [facility], [address], [place], [place] [address]    Special Requests   Final    BOTTLES DRAWN AEROBIC ONLY Blood Culture adequate volume Performed at [facility], [address] [place], [place] [address]    Culture   Final    NO GROWTH 2 DAYS Performed at [facility], [address] [place], [place] [address]    Report Status PENDING  Incomplete  Aerobic/Anaerobic Culture (surgical/deep wound)     Status: None (Preliminary result)   Collection Time: 07/18/19 10:59 AM   Specimen: Abscess  Result Value Ref Range Status   Specimen  Description   Final    ABSCESS ABDOMEN Performed at [facility], [address] [place], [place] [address]    Special Requests   Final    Normal Performed at [facility], [address], [place], [place] [address]    Gram Stain   Final    ABUNDANT WBC PRESENT,BOTH PMN AND MONONUCLEAR ABUNDANT GRAM POSITIVE COCCI ABUNDANT GRAM NEGATIVE RODS FEW GRAM VARIABLE ROD Performed at [facility], [address], [place], [place] [address]    Culture ABUNDANT GRAM NEGATIVE RODS  Final   Report Status PENDING  Incomplete    Radiology Reports Ct Angio Chest Pe W Or Wo Contrast  Result Date: 07/16/2019 CLINICAL DATA:  Acute onset of shortness of breath today EXAM: CT ANGIOGRAPHY CHEST WITH CONTRAST TECHNIQUE: Multidetector CT imaging of the chest was performed using the standard protocol during bolus administration of intravenous contrast. Multiplanar CT image reconstructions and MIPs were obtained to evaluate the vascular anatomy. CONTRAST:  80mL OMNIPAQUE IOHEXOL 350 MG/ML SOLN COMPARISON:  Chest x-ray 07/07/2019 FINDINGS: Cardiovascular: Satisfactory opacification of the pulmonary arteries to the segmental level. No  evidence of pulmonary embolism. Normal heart size. Tiny pericardial effusion. Mediastinum/Nodes: No enlarged mediastinal, hilar, or axillary lymph nodes. Thyroid gland, trachea, and esophagus demonstrate no significant findings. Lungs/Pleura: Patient has extensive patchy bilateral pulmonary infiltrates primarily in the upper lobes. Small bilateral pleural effusions, slightly greater on the left. Slight atelectasis in the right middle lobe and in the right lower lobe. Upper Abdomen: Free air under the right hemidiaphragm. See CT scan of the abdomen report for further information. Musculoskeletal: No chest wall abnormality. No acute or significant osseous findings. Review of the MIP images confirms the above findings. IMPRESSION: 1. No pulmonary emboli. 2. Extensive patchy bilateral pulmonary infiltrates primarily in the upper lobes. 3. Small bilateral pleural effusions, slightly greater on the left. 4. Free air under the right hemidiaphragm. See CT scan of the abdomen report for further information. Electronically Signed   By: Francene Boyers M.D.   On: 07/16/2019 21:25   Ct Abdomen Pelvis W Contrast  Result Date: 07/16/2019 CLINICAL DATA:  Abdominal pain and recurrent fever EXAM: CT ABDOMEN AND PELVIS WITH CONTRAST TECHNIQUE: Multidetector CT imaging of the abdomen and pelvis was performed using the standard protocol following bolus administration of intravenous contrast. CONTRAST:  OMNIPAQUE IOHEXOL 300 MG/ML  SOLN COMPARISON:  07/11/2019 FINDINGS: Lower chest: Bilateral small effusions are noted which have increased somewhat in the interval from the prior exam. Patchy infiltrate is again identified in the lung bases but has increased somewhat from the prior exams particularly in the upper lobes bilaterally. Hepatobiliary: The liver is within normal limits. The gallbladder is well distended with multiple gallstones within. Pancreas: Unremarkable. No pancreatic ductal dilatation or surrounding inflammatory  changes. Spleen: Normal in size without focal abnormality. Adrenals/Urinary Tract: Adrenal glands are within normal limits. Kidneys demonstrate a normal enhancement pattern bilaterally. Delayed images demonstrate normal excretion without obstructive change. The bladder is partially distended. Stomach/Bowel: There again noted changes consistent with diverticular disease throughout the colon. Adjacent to the sigmoid colon however in an area of previous inflammation there is now an air-fluid collection that measures at least 7.8 x 4.3 cm in dimension within air-fluid level within consistent with a pericolonic abscess. Additionally there are findings along the ascending colon suggestive of rupture of the ascending colon with increase in intraperitoneal air. A large air fluid collection is identified in the right mid abdomen  which measures at least 13 by 7 cm in dimension best seen on image number 65 of series 2. This lies adjacent to the inflamed ascending colon and is also consistent with a pericolonic abscess. Additionally communicating with this large collection is a third air-fluid collection which lies along the lateral aspect of the right lobe of the liver causing mass effect upon the adjacent liver measuring 15 x 6.2 cm in greatest dimension. The appendix is within normal limits. The small bowel is also unremarkable. The stomach is within normal limits. Vascular/Lymphatic: No significant vascular findings are present. No enlarged abdominal or pelvic lymph nodes. Reproductive: Uterus and bilateral adnexa are unremarkable. Other: No abdominal wall hernia or abnormality. No abdominopelvic ascites. Musculoskeletal: Degenerative changes of the lumbar spine are seen. IMPRESSION: There are changes consistent with progressive inflammatory change involving the colon primarily within the ascending colon in an area where prior small pericolonic air collections were noted. This results in a large somewhat bilobed air-fluid  collection which extends along the lateral aspect of the liver as well as anterior to the ascending colon. It is possible the collection anterior to the ascending colon is dilatation of the ascending colon although felt to be less likely given the overall appearance. A third collection is noted along the course of the sigmoid colon consistent with increasing pericolonic abscess as described. Bilateral pleural effusions which have increased in the interval from the prior exam. Additionally increasing patchy infiltrate is noted throughout both lungs primarily within the bases although now seen within the upper lobes bilaterally. These results will be called to the ordering clinician or representative by the Radiologist Assistant, and communication documented in the PACS or zVision Dashboard. Electronically Signed   By: Alcide Clever M.D.   On: 07/16/2019 19:49   Ct Abdomen Pelvis W Contrast  Addendum Date: 07/11/2019   ADDENDUM REPORT: 07/11/2019 20:01 ADDENDUM: Critical Value/emergent results were called by telephone at the time of interpretation on 07/11/2019 at 8:01 pm to providerDR. Romie Levee , who verbally acknowledged these results. Electronically Signed   By: Charlett Nose M.D.   On: 07/11/2019 20:01   Result Date: 07/11/2019 CLINICAL DATA:  Abdominal pain EXAM: CT ABDOMEN AND PELVIS WITH CONTRAST TECHNIQUE: Multidetector CT imaging of the abdomen and pelvis was performed using the standard protocol following bolus administration of intravenous contrast. CONTRAST:  OMNIPAQUE IOHEXOL 300 MG/ML SOLN, 30mL OMNIPAQUE IOHEXOL 300 MG/ML SOLN COMPARISON:  07/07/2019 FINDINGS: Lower chest: Trace bilateral pleural effusions. Bibasilar atelectasis. Heart is normal size. Hepatobiliary: Gallstones within the gallbladder. No focal hepatic abnormality. Pancreas: No focal abnormality or ductal dilatation. Spleen: No focal abnormality.  Normal size. Adrenals/Urinary Tract: No adrenal abnormality. No focal renal  abnormality. No stones or hydronephrosis. Urinary bladder is unremarkable. Stomach/Bowel: Extensive colonic diverticulosis. Stranding/inflammation around the sigmoid colon has progressed since prior study. There is also abnormal stranding along the anterior aspect of the ascending colon which is new since prior study. Increasing pneumoperitoneum with locules of gas throughout the peritoneum. Is difficult to determine if the source is the sigmoid colon or the ascending colon. Appendix is normal. Stomach and small bowel decompressed. Vascular/Lymphatic: No evidence of aneurysm or adenopathy. Reproductive: Uterus and adnexa unremarkable.  No mass. Other: Small amount of perihepatic ascites. Musculoskeletal: No acute bony abnormality. IMPRESSION: Diffuse colonic diverticulosis. Worsening inflammation around the sigmoid colon with new inflammation along the anterior wall of the ascending colon. There is increasing pneumoperitoneum throughout the abdomen and pelvis along with new perihepatic ascites. Findings compatible  with perforation. It is difficult to determine exact source of free air. The majority of the free intraperitoneal air is centered anterior to the ascending colon and liver suggesting the possibility that the ascending colon is the source. Cholelithiasis. Trace bilateral effusions.  Bibasilar atelectasis. Electronically Signed: By: Charlett NoseKevin  Dover M.D. On: 07/11/2019 19:32   Ct Abdomen Pelvis W Contrast  Result Date: 07/07/2019 CLINICAL DATA:  Abdominal pain diarrhea EXAM: CT ABDOMEN AND PELVIS WITH CONTRAST TECHNIQUE: Multidetector CT imaging of the abdomen and pelvis was performed using the standard protocol following bolus administration of intravenous contrast. CONTRAST:  100mL OMNIPAQUE IOHEXOL 300 MG/ML  SOLN COMPARISON:  None. FINDINGS: Lower chest: Lung bases demonstrate no acute consolidation or effusion. The heart size is within normal limits. Hepatobiliary: No focal hepatic abnormality or  biliary dilatation. Increased intraluminal density within the gallbladder. Pancreas: Unremarkable. No pancreatic ductal dilatation or surrounding inflammatory changes. Spleen: Normal in size without focal abnormality. Adrenals/Urinary Tract: Adrenal glands are unremarkable. Kidneys are normal, without renal calculi, focal lesion, or hydronephrosis. Bladder is unremarkable. Stomach/Bowel: The stomach is nonenlarged. No dilated small bowel. Nondilated appendix. Hyperdense focus at the cecum near the origin of the appendix, possible diverticulum versus small appendicoliths but no evidence for appendiceal inflammatory change. Fluid within the colon. Sigmoid colon diverticular disease with wall thickening and inflammatory change consistent with acute diverticulitis. Multiple foci of extraluminal gas at the inflamed sigmoid colon. Vascular/Lymphatic: Nonaneurysmal aorta. No significantly enlarged lymph nodes. Reproductive: Uterus and bilateral adnexa are unremarkable. Other: Small foci of extraluminal gas within the right lower quadrant adjacent to the inflamed sigmoid colon. Tiny foci of gas within the right upper quadrant and overlying the dome of the liver. Musculoskeletal: No acute or suspicious osseous abnormality. There are degenerative changes. IMPRESSION: 1. Focal inflammatory changes and wall thickening involving the distal sigmoid colon consistent with acute diverticulitis. Multiple small foci of gas adjacent to the inflamed sigmoid colon with small foci of free air adjacent to the liver, consistent with perforation. Negative for organized abscess at this time. 2. Increased intraluminal density within the gallbladder, possible stones or sludge. Critical Value/emergent results were called by telephone at the time of interpretation on 07/07/2019 at 11:12 pm to providerBOWIE TRAN , who verbally acknowledged these results. Electronically Signed   By: Jasmine PangKim  Fujinaga M.D.   On: 07/07/2019 23:12   Dg Chest Portable 1  View  Result Date: 07/07/2019 CLINICAL DATA:  Fever, recent COVID exposure. EXAM: PORTABLE CHEST 1 VIEW COMPARISON:  04/24/2013 FINDINGS: The heart size and mediastinal contours are within normal limits. Both lungs are clear. The visualized skeletal structures are unremarkable. IMPRESSION: No active disease. Electronically Signed   By: Charlett NoseKevin  Dover M.D.   On: 07/07/2019 19:45   Ct Image Guided Drainage By Percutaneous Catheter  Result Date: 07/18/2019 INDICATION: Colonic perforation of indeterminate source now with multiple intra-abdominal mixed fluid though predominantly air containing collections within the abdomen. Active COVID-19 infection. Please perform CT-guided percutaneous drainage catheter(s) placement(s) for infection source control purposes. EXAM: CT IMAGE GUIDED DRAINAGE BY PERCUTANEOUS CATHETER x2 COMPARISON:  CT abdomen pelvis - 07/11/2019; 07/07/2019 MEDICATIONS: The patient is currently admitted to the hospital and receiving intravenous antibiotics. The antibiotics were administered within an appropriate time frame prior to the initiation of the procedure. ANESTHESIA/SEDATION: Moderate (conscious) sedation was employed during this procedure. A total of Versed 6 mg and Fentanyl 300 mcg was administered intravenously. Moderate Sedation Time: 50 minutes. The patient's level of consciousness and vital signs were monitored continuously by  radiology nursing throughout the procedure under my direct supervision. CONTRAST:  None COMPLICATIONS: None immediate. PROCEDURE: Informed written consent was obtained from the patient after a discussion of the risks, benefits and alternatives to treatment. The patient was placed supine on the CT gantry and a pre procedural CT was performed re-demonstrating the known abscesses/air and fluid collections within the with dominant collection within the right upper abdominal quadrant measuring approximately 14.8 x 6.3 cm (image 50, series 2) dominant predominantly  air collection within right lower quadrant measuring approximately 15.5 x 8.3 cm (image 72, series 2) and dominant mixed and air and fluid containing collection within the midline of the lower abdomen/pelvis measuring approximately 9.7 x 5.1 cm (image 97, series 2). The procedure was planned. A timeout was performed prior to the initiation of the procedure. The right upper and mid abdomen as well as the left lower abdomen was was prepped and draped in the usual sterile fashion. The overlying soft tissues were anesthetized with 1% lidocaine with epinephrine. 18 gauge trocar needles were advanced into each fluid collection in short Amplatz wires were coiled within each separate collection. Next, sequentially, each track was serially dilated allowing placement of 12 French percutaneous drainage catheters under intermittent CT fluoroscopic guidance. Completion CT imaging was obtained and drainage catheters were retracted into the dominant component of each air and fluid containing collection. Postprocedural CT imaging was obtained. A total of approximately 45 cc of feculent appearing fluid was aspirated from all 3 drains, with the largest amount aspirated from the left lower quadrant percutaneous drainage catheter. The drainage catheter within the left lower abdominal quadrant was connected to a gravity bag while the other 2 percutaneous drainage catheters were connected to JP bulbs. All drainage catheters were secured at the skin entrance site within interrupted sutures and Stat Lock devices. Dressings were applied. The patient tolerated the procedure well without immediate postprocedural complication. IMPRESSION: Technically successful CT-guided placement of 3 separate percutaneous drainage catheters into dominant mixed fluid though predominantly air containing collections within the right upper and mid abdomen as well as the left lower abdominal quadrant. Electronically Signed   By: Simonne Come M.D.   On: 07/18/2019  13:05   Ct Image Guided Drainage By Percutaneous Catheter  Result Date: 07/18/2019 INDICATION: Colonic perforation of indeterminate source now with multiple intra-abdominal mixed fluid though predominantly air containing collections within the abdomen. Active COVID-19 infection. Please perform CT-guided percutaneous drainage catheter(s) placement(s) for infection source control purposes. EXAM: CT IMAGE GUIDED DRAINAGE BY PERCUTANEOUS CATHETER x2 COMPARISON:  CT abdomen pelvis - 07/11/2019; 07/07/2019 MEDICATIONS: The patient is currently admitted to the hospital and receiving intravenous antibiotics. The antibiotics were administered within an appropriate time frame prior to the initiation of the procedure. ANESTHESIA/SEDATION: Moderate (conscious) sedation was employed during this procedure. A total of Versed 6 mg and Fentanyl 300 mcg was administered intravenously. Moderate Sedation Time: 50 minutes. The patient's level of consciousness and vital signs were monitored continuously by radiology nursing throughout the procedure under my direct supervision. CONTRAST:  None COMPLICATIONS: None immediate. PROCEDURE: Informed written consent was obtained from the patient after a discussion of the risks, benefits and alternatives to treatment. The patient was placed supine on the CT gantry and a pre procedural CT was performed re-demonstrating the known abscesses/air and fluid collections within the with dominant collection within the right upper abdominal quadrant measuring approximately 14.8 x 6.3 cm (image 50, series 2) dominant predominantly air collection within right lower quadrant measuring approximately 15.5 x 8.3  cm (image 72, series 2) and dominant mixed and air and fluid containing collection within the midline of the lower abdomen/pelvis measuring approximately 9.7 x 5.1 cm (image 97, series 2). The procedure was planned. A timeout was performed prior to the initiation of the procedure. The right upper  and mid abdomen as well as the left lower abdomen was was prepped and draped in the usual sterile fashion. The overlying soft tissues were anesthetized with 1% lidocaine with epinephrine. 18 gauge trocar needles were advanced into each fluid collection in short Amplatz wires were coiled within each separate collection. Next, sequentially, each track was serially dilated allowing placement of 12 French percutaneous drainage catheters under intermittent CT fluoroscopic guidance. Completion CT imaging was obtained and drainage catheters were retracted into the dominant component of each air and fluid containing collection. Postprocedural CT imaging was obtained. A total of approximately 45 cc of feculent appearing fluid was aspirated from all 3 drains, with the largest amount aspirated from the left lower quadrant percutaneous drainage catheter. The drainage catheter within the left lower abdominal quadrant was connected to a gravity bag while the other 2 percutaneous drainage catheters were connected to JP bulbs. All drainage catheters were secured at the skin entrance site within interrupted sutures and Stat Lock devices. Dressings were applied. The patient tolerated the procedure well without immediate postprocedural complication. IMPRESSION: Technically successful CT-guided placement of 3 separate percutaneous drainage catheters into dominant mixed fluid though predominantly air containing collections within the right upper and mid abdomen as well as the left lower abdominal quadrant. Electronically Signed   By: Simonne Come M.D.   On: 07/18/2019 13:05   Ct Image Guided Drainage By Percutaneous Catheter  Result Date: 07/18/2019 INDICATION: Colonic perforation of indeterminate source now with multiple intra-abdominal mixed fluid though predominantly air containing collections within the abdomen. Active COVID-19 infection. Please perform CT-guided percutaneous drainage catheter(s) placement(s) for infection  source control purposes. EXAM: CT IMAGE GUIDED DRAINAGE BY PERCUTANEOUS CATHETER x2 COMPARISON:  CT abdomen pelvis - 07/11/2019; 07/07/2019 MEDICATIONS: The patient is currently admitted to the hospital and receiving intravenous antibiotics. The antibiotics were administered within an appropriate time frame prior to the initiation of the procedure. ANESTHESIA/SEDATION: Moderate (conscious) sedation was employed during this procedure. A total of Versed 6 mg and Fentanyl 300 mcg was administered intravenously. Moderate Sedation Time: 50 minutes. The patient's level of consciousness and vital signs were monitored continuously by radiology nursing throughout the procedure under my direct supervision. CONTRAST:  None COMPLICATIONS: None immediate. PROCEDURE: Informed written consent was obtained from the patient after a discussion of the risks, benefits and alternatives to treatment. The patient was placed supine on the CT gantry and a pre procedural CT was performed re-demonstrating the known abscesses/air and fluid collections within the with dominant collection within the right upper abdominal quadrant measuring approximately 14.8 x 6.3 cm (image 50, series 2) dominant predominantly air collection within right lower quadrant measuring approximately 15.5 x 8.3 cm (image 72, series 2) and dominant mixed and air and fluid containing collection within the midline of the lower abdomen/pelvis measuring approximately 9.7 x 5.1 cm (image 97, series 2). The procedure was planned. A timeout was performed prior to the initiation of the procedure. The right upper and mid abdomen as well as the left lower abdomen was was prepped and draped in the usual sterile fashion. The overlying soft tissues were anesthetized with 1% lidocaine with epinephrine. 18 gauge trocar needles were advanced into each fluid collection in short  Amplatz wires were coiled within each separate collection. Next, sequentially, each track was serially dilated  allowing placement of 12 French percutaneous drainage catheters under intermittent CT fluoroscopic guidance. Completion CT imaging was obtained and drainage catheters were retracted into the dominant component of each air and fluid containing collection. Postprocedural CT imaging was obtained. A total of approximately 45 cc of feculent appearing fluid was aspirated from all 3 drains, with the largest amount aspirated from the left lower quadrant percutaneous drainage catheter. The drainage catheter within the left lower abdominal quadrant was connected to a gravity bag while the other 2 percutaneous drainage catheters were connected to JP bulbs. All drainage catheters were secured at the skin entrance site within interrupted sutures and Stat Lock devices. Dressings were applied. The patient tolerated the procedure well without immediate postprocedural complication. IMPRESSION: Technically successful CT-guided placement of 3 separate percutaneous drainage catheters into dominant mixed fluid though predominantly air containing collections within the right upper and mid abdomen as well as the left lower abdominal quadrant. Electronically Signed   By: Sandi Mariscal M.D.   On: 07/18/2019 13:05   [place] Ekg Site Rite  Result Date: 07/17/2019 If Site Rite image not attached, placement could not be confirmed due to current cardiac rhythm.   Time Spent in minutes 35   Demisha Nokes M.D on 07/19/2019 at 12:55 PM  Between 7am to 7pm - Pager - [phone]  After 7pm go to [email or web address] - password [email or web address]  [facility] -  Office  [phone]

## 2019-07-20 LAB — BASIC METABOLIC PANEL
Anion gap: 8 (ref 5–15)
BUN: 16 mg/dL (ref 6–20)
CO2: 23 mmol/L (ref 22–32)
Calcium: 8.2 mg/dL — ABNORMAL LOW (ref 8.9–10.3)
Chloride: 105 mmol/L (ref 98–111)
Creatinine, Ser: 0.57 mg/dL (ref 0.44–1.00)
GFR calc Af Amer: >60 mL/min (ref >60)
GFR calc non Af Amer: >60 mL/min (ref >60)
Glucose, Bld: 146 mg/dL — ABNORMAL HIGH (ref 70–99)
Potassium: 4.3 mmol/L (ref 3.5–5.1)
Sodium: 136 mmol/L (ref 135–145)

## 2019-07-20 LAB — GLUCOSE, CAPILLARY
Glucose-Capillary: 114 mg/dL — ABNORMAL HIGH (ref 70–99)
Glucose-Capillary: 141 mg/dL — ABNORMAL HIGH (ref 70–99)
Glucose-Capillary: 171 mg/dL — ABNORMAL HIGH (ref 70–99)
Glucose-Capillary: 182 mg/dL — ABNORMAL HIGH (ref 70–99)

## 2019-07-20 LAB — CBC WITH DIFFERENTIAL/PLATELET
Abs Immature Granulocytes: 0.05 10*3/uL (ref 0.00–0.07)
Basophils Absolute: 0 10*3/uL (ref 0.0–0.1)
Basophils Relative: 0 %
Eosinophils Absolute: 0 10*3/uL (ref 0.0–0.5)
Eosinophils Relative: 0 %
HCT: 33.9 % — ABNORMAL LOW (ref 36.0–46.0)
Hemoglobin: 11 g/dL — ABNORMAL LOW (ref 12.0–15.0)
Immature Granulocytes: 1 %
Lymphocytes Relative: 14 %
Lymphs Abs: 0.9 10*3/uL (ref 0.7–4.0)
MCH: 28.5 pg (ref 26.0–34.0)
MCHC: 32.4 g/dL (ref 30.0–36.0)
MCV: 87.8 fL (ref 80.0–100.0)
Monocytes Absolute: 0.3 10*3/uL (ref 0.1–1.0)
Monocytes Relative: 5 %
Neutro Abs: 5 10*3/uL (ref 1.7–7.7)
Neutrophils Relative %: 80 %
Platelets: 486 10*3/uL — ABNORMAL HIGH (ref 150–400)
RBC: 3.86 MIL/uL — ABNORMAL LOW (ref 3.87–5.11)
RDW: 16.7 % — ABNORMAL HIGH (ref 11.5–15.5)
WBC: 6.3 10*3/uL (ref 4.0–10.5)
nRBC: 0 % (ref 0.0–0.2)

## 2019-07-20 LAB — FERRITIN: Ferritin: 330 ng/mL — ABNORMAL HIGH (ref 11–307)

## 2019-07-20 LAB — FIBRINOGEN: Fibrinogen: 568 mg/dL — ABNORMAL HIGH (ref 210–475)

## 2019-07-20 LAB — C-REACTIVE PROTEIN: CRP: 11.2 mg/dL — ABNORMAL HIGH (ref <1.0)

## 2019-07-20 LAB — PHOSPHORUS: Phosphorus: 3 mg/dL (ref 2.5–4.6)

## 2019-07-20 LAB — D-DIMER, QUANTITATIVE: D-Dimer, Quant: 5.54 ug/mL-FEU — ABNORMAL HIGH (ref 0.00–0.50)

## 2019-07-20 MED ORDER — SODIUM CHLORIDE 0.9 % IV SOLN
INTRAVENOUS | Status: DC
  Administered 2019-07-20 – 2019-07-23 (×2): via INTRAVENOUS

## 2019-07-20 MED ORDER — METHOCARBAMOL 500 MG PO TABS
500.0000 mg | ORAL_TABLET | Freq: Three times a day (TID) | ORAL | Status: DC
  Administered 2019-07-20 – 2019-07-21 (×3): 500 mg via ORAL
  Filled 2019-07-20 (×3): qty 1

## 2019-07-20 MED ORDER — OXYCODONE HCL 5 MG PO TABS
5.0000 mg | ORAL_TABLET | Freq: Four times a day (QID) | ORAL | Status: DC
  Administered 2019-07-20 – 2019-07-26 (×25): 5 mg via ORAL
  Filled 2019-07-20 (×26): qty 1

## 2019-07-20 MED ORDER — ALPRAZOLAM 0.5 MG PO TABS
0.5000 mg | ORAL_TABLET | Freq: Two times a day (BID) | ORAL | Status: DC
  Administered 2019-07-20 – 2019-07-26 (×12): 0.5 mg via ORAL
  Filled 2019-07-20 (×12): qty 1

## 2019-07-20 MED ORDER — TRAVASOL 10 % IV SOLN
INTRAVENOUS | Status: AC
  Administered 2019-07-20: 17:00:00 via INTRAVENOUS
  Filled 2019-07-20: qty 1122

## 2019-07-20 NOTE — Progress Notes (Signed)
PROGRESS NOTE                                                                                                                                                                                                             Patient Demographics:    Holly Moon, is a 48 y.o. female, DOB - [birth year], WUJ:[id number]  Admit date - 07/07/2019   Admitting Physician Md Montez Morita, MD  Outpatient Primary MD for the patient is [name], [place]  LOS - 12    Chief Complaint  Patient presents with   Abdominal Pain   Near Syncope   Fever   Diarrhea       Brief Narrative 48 year old morbidly obese female with essential hypertension, fibromyalgia, history of PE off anticoagulation, alcohol and tobacco use who is admitted to surgical service with complicated acute sigmoid diverticulitis with developing fluid collection and abscess.  Abdominal CT from 11/18 showed diffuse colon diverticulosis with worsening inflammation around sigmoid colon and increased pneumoperitoneum with perihepatic ascites.  Repeat CT on 11/23 with possible abscesses.  Patient on empiric IV Zosyn and IR consulted for image guided drainage.  Hospitalist consulted for intermittent fever with new bilateral upper lobe infiltrate with effusion, increasing leukocytosis.  On 11/23 rapid response was called due to fever of 103F with tachycardia and tachypnea.  CT angiogram of the chest was negative for PE but showed extensive patchy bilateral pulmonary infiltrate in the upper lobes with small bilateral pleural effusion (L >R) reportedly patient had history of Covid in the family about 1 week prior to hospitalization.  Covid PCR test were checked on admission on 11/14.  Given her acute respiratory symptoms COVID-19 was tested again and came back positive.  Along with that she was found to have elevated D-dimer of 9.1, CRP of 30, fibrinogen of 700 and normal ferritin.  Patient  placed on airborne and contact precautions and started on empiric Decadron and remdesivir.    Subjective:   Patient anxious and asking for Xanax and pain meds frequently.  Discussed at length about her concerns and told her that it was natural for her to get anxious with her current illness and not being able to see her family.  Told her that we can switch some of her pain meds and Xanax to scheduled to limit nursing visit into the room and she agrees.  Abdominal pain stable on current regimen.  No nausea or vomiting.  Denies shortness of breath.  Assessment  & Plan :   Principal problem Severe sepsis with acute hypoxic respiratory failure ([place]) Secondary to new COVID-19 infection diagnosed during hospital stay. Inflammatory markers continue to improve.  Continue IV Decadron and remdesivir Sats stable on room air.   Active symptoms Perforated sigmoid diverticulitis with abscess On 11/25, underwent CT-guided 12 French drainage catheter placement in the left lower abdomen/pelvis adjacent to the suspected colonic perforation with about 45 cc feculent material was aspirated and sent for microscopy.  Cultures growing GPC and GNR, mostly E. coli which is pansensitive (except for quinolones). On empiric IV Zosyn possible transition to cephalosporin tomorrow if remains afebrile..  Surgery following. Currently on clear liquid supplemented with TPN.  Having bowel movements.   Essential hypertension Continue ACE inhibitor.  History of fibromyalgia Continue Topamax and Zanaflex.  Morbid obesity  History of tobacco and alcohol use No signs of withdrawal.  Patient reported quitting smoking 10 days prior to admission.   Code Status : Full code  Family Communication  : Husband updated on the phone  Disposition Plan  : Pending hospital course  Barriers For Discharge : Active symptoms  Consults  : CCS, IR  Procedures  : CT abdomen pelvis, CT angiogram of the chest, IR drainage of sigmoid  abscess with perforation  DVT Prophylaxis  :  Lovenox  Lab Results  Component Value Date   PLT 486 (H) 07/20/2019    Antibiotics  :    Anti-infectives (From admission, onward)   Start     Dose/Rate Route Frequency Ordered Stop   07/19/19 1000  remdesivir 100 mg in sodium chloride 0.9 % 250 mL IVPB     100 mg 500 mL/hr over 30 Minutes Intravenous Daily 07/17/19 1822 07/22/19 0959   07/18/19 1600  remdesivir 100 mg in sodium chloride 0.9 % 250 mL IVPB     100 mg 500 mL/hr over 30 Minutes Intravenous  Once 07/17/19 1822 07/18/19 1615   07/18/19 0600  vancomycin (VANCOCIN) 1,250 mg in sodium chloride 0.9 % 250 mL IVPB  Status:  Discontinued     1,250 mg 166.7 mL/hr over 90 Minutes Intravenous Every 12 hours 07/17/19 1055 07/19/19 1319   07/17/19 1830  remdesivir 200 mg in sodium chloride 0.9 % 250 mL IVPB     200 mg 500 mL/hr over 30 Minutes Intravenous Once 07/17/19 1822 07/17/19 2314   07/17/19 1100  vancomycin (VANCOCIN) 2,500 mg in sodium chloride 0.9 % 500 mL IVPB     2,500 mg 250 mL/hr over 120 Minutes Intravenous  Once 07/17/19 1004 07/17/19 2037   07/08/19 0600  piperacillin-tazobactam (ZOSYN) IVPB 3.375 g     3.375 g 12.5 mL/hr over 240 Minutes Intravenous Every 8 hours 07/08/19 0129     07/07/19 2315  piperacillin-tazobactam (ZOSYN) IVPB 3.375 g     3.375 g 100 mL/hr over 30 Minutes Intravenous  Once 07/07/19 2314 07/07/19 2356        Objective:   Vitals:   07/19/19 1439 07/19/19 2057 07/20/19 0000 07/20/19 0504  BP: (!) 163/93 (!) 160/81 (!) 155/66 (!) 158/98  Pulse: 64 (!) 59 (!) 41 (!) 51  Resp: 16 16 20 20   Temp: 98.4 F (36.9 C) 98.9 F (37.2 C) 97.8 F (36.6 C) 97.8 F (36.6 C)  TempSrc: Oral Oral Oral Oral  SpO2: 94% 99% 93%   Weight:      Height:  Wt Readings from Last 3 Encounters:  07/07/19 133 kg  02/17/18 (!) 155.1 kg  11/07/16 127 kg     Intake/Output Summary (Last 24 hours) at 07/20/2019 1154 Last data filed at 07/20/2019  1000 Gross per 24 hour  Intake 2596.41 ml  Output 560 ml  Net 2036.41 ml   Physical exam Not in distress HEENT: Moist mucosa, supple neck Chest: Clear CVs: Normal S1-S2,  GI: Soft, nondistended, bowel sounds present, minimal tenderness, right lower quadrant drain with some purulent output Musculoskeletal: warm, no edema         Data Review:    CBC Recent Labs  Lab 07/16/19 0548 07/17/19 0615 07/18/19 0449 07/19/19 0416 07/20/19 0510  WBC 11.5* 16.7* 12.2* 8.1 6.3  HGB 12.2 11.0* 10.6* 10.4* 11.0*  HCT 40.1 35.1* 32.8* 33.4* 33.9*  PLT 446* 366 372 457* 486*  MCV 90.9 88.4 86.1 86.5 87.8  MCH 27.7 27.7 27.8 26.9 28.5  MCHC 30.4 31.3 32.3 31.1 32.4  RDW 16.1* 15.9* 15.6* 15.9* 16.7*  LYMPHSABS  --   --  0.7 0.6* 0.9  MONOABS  --   --  0.3 0.3 0.3  EOSABS  --   --  0.0 0.0 0.0  BASOSABS  --   --  0.0 0.0 0.0    Chemistries  Recent Labs  Lab 07/16/19 1433 07/17/19 1034 07/18/19 0449 07/19/19 0416 07/20/19 0510  NA 135 135 136 133* 136  K 3.9 3.6 3.8 4.3 4.3  CL 105 104 105 105 105  CO2 20* 22 24 22 23   GLUCOSE 93 83 151* 152* 146*  BUN 7 6 14 16 16   CREATININE 0.69 0.72 0.58 0.52 0.57  CALCIUM 8.0* 7.7* 7.6* 7.3* 8.2*  MG  --  2.1 2.2 2.1  --   AST 22 23 30  48*  --   ALT 15 14 16 23   --   ALKPHOS 94 87 79 74  --   BILITOT 0.4 0.7 0.6 0.7  --    ------------------------------------------------------------------------------------------------------------------ Recent Labs    07/18/19 0449  TRIG 185*    No results found for: HGBA1C ------------------------------------------------------------------------------------------------------------------ No results for input(s): TSH, T4TOTAL, T3FREE, THYROIDAB in the last 72 hours.  Invalid input(s): FREET3 ------------------------------------------------------------------------------------------------------------------ Recent Labs    07/19/19 0419 07/20/19 0510  FERRITIN 317* 330*    Coagulation  profile Recent Labs  Lab 07/17/19 1601 07/18/19 0449  INR 1.5* 1.5*    Recent Labs    07/19/19 0416 07/20/19 0510  DDIMER 7.37* 5.54*    Cardiac Enzymes No results for input(s): CKMB, TROPONINI, MYOGLOBIN in the last 168 hours.  Invalid input(s): CK ------------------------------------------------------------------------------------------------------------------ No results found for: BNP  Inpatient Medications  Scheduled Meds:  acetaminophen  1,000 mg Oral Q6H   ALPRAZolam  0.5 mg Oral BID   benazepril  20 mg Oral Daily   busPIRone  30 mg Oral BID   Chlorhexidine Gluconate Cloth  6 each Topical Daily   desvenlafaxine  50 mg Oral Daily   dexamethasone (DECADRON) injection  6 mg Intravenous Daily   enoxaparin (LOVENOX) injection  60 mg Subcutaneous Q12H   feeding supplement (PRO-STAT SUGAR FREE 64)  30 mL Oral BID   gabapentin  300 mg Oral TID   hydrALAZINE  5 mg Intravenous Once   insulin aspart  0-15 Units Subcutaneous Q6H   methocarbamol  500 mg Oral TID   multivitamin with minerals  1 tablet Oral Daily   oxyCODONE  5 mg Oral Q6H   pneumococcal 23 valent  vaccine  0.5 mL Intramuscular Tomorrow-1000   potassium chloride  20 mEq Oral BID   Ensure Max Protein  11 oz Oral BID   sodium chloride flush  5 mL Intracatheter Q8H   tiZANidine  4 mg Oral QHS   topiramate  100 mg Oral Daily   Continuous Infusions:  sodium chloride 40 mL/hr at 07/19/19 1759   sodium chloride     piperacillin-tazobactam (ZOSYN)  IV 3.375 g (07/20/19 0444)   remdesivir 100 mg in NS 250 mL 100 mg (07/20/19 0956)   TPN ADULT (ION) 60 mL/hr at 07/19/19 1726   TPN ADULT (ION)     PRN Meds:.alum & mag hydroxide-simeth, diphenhydrAMINE **OR** diphenhydrAMINE, HYDROmorphone (DILAUDID) injection, lip balm, ondansetron **OR** ondansetron (ZOFRAN) IV, simethicone, sodium chloride flush  Micro Results Recent Results (from the past 240 hour(s))  SARS CORONAVIRUS 2 (TAT 6-24  HRS) Nasopharyngeal Nasopharyngeal Swab     Status: Abnormal   Collection Time: 07/17/19  8:14 AM   Specimen: Nasopharyngeal Swab  Result Value Ref Range Status   SARS Coronavirus 2 POSITIVE (A) NEGATIVE Final    Comment: RESULT CALLED TO, READ BACK BY AND VERIFIED WITH: Cory Munch RN 15:15 07/17/19 ([email or web address]) (NOTE) SARS-CoV-2 target nucleic acids are DETECTED. The SARS-CoV-2 RNA is generally detectable in upper and lower respiratory specimens during the acute phase of infection. Positive results are indicative of the presence of SARS-CoV-2 RNA. Clinical correlation with patient history and other diagnostic information is  necessary to determine patient infection status. Positive results do not rule out bacterial infection or co-infection with other viruses.  The expected result is Negative. Fact Sheet for Patients: HairSlick.no Fact Sheet for Healthcare Providers: quierodirigir.com This test is not yet approved or cleared by the [place] FDA and  has been authorized for detection and/or diagnosis of SARS-CoV-2 by FDA under an Emergency Use Authorization (EUA). This EUA will remain  in effect (meaning this test can be used) for t he duration of the COVID-19 declaration under Section 564(b)(1) of the Act, 21 U.S.C. section 360bbb-3(b)(1), unless the authorization is terminated or revoked sooner. Performed at [facility], [address] [place], [place] [address]   Culture, blood (routine x 2)     Status: None (Preliminary result)   Collection Time: 07/17/19 10:34 AM   Specimen: BLOOD  Result Value Ref Range Status   Specimen Description   Final    BLOOD RIGHT ARM Performed at [facility], [address], [place], [place] [address]    Special Requests   Final    BOTTLES DRAWN AEROBIC ONLY Blood Culture adequate volume Performed at [facility], [address],  [place], [place] [address]    Culture   Final    NO GROWTH 3 DAYS Performed at [facility], [address], [place], [place] [address]    Report Status PENDING  Incomplete  Culture, blood (routine x 2)     Status: None (Preliminary result)   Collection Time: 07/17/19  4:00 PM   Specimen: BLOOD  Result Value Ref Range Status   Specimen Description   Final    BLOOD RIGHT ANTECUBITAL Performed at [facility], [address], [place], [place] [address]    Special Requests   Final    BOTTLES DRAWN AEROBIC ONLY Blood Culture adequate volume Performed at [facility], [address] [place], [place] [address]    Culture   Final    NO  GROWTH 3 DAYS Performed at [facility], [address] [place], [place] [address]    Report Status PENDING  Incomplete  Aerobic/Anaerobic Culture (surgical/deep wound)     Status: None (Preliminary result)   Collection Time: 07/18/19 10:59 AM   Specimen: Abscess  Result Value Ref Range Status   Specimen Description   Final    ABSCESS ABDOMEN Performed at [facility], [address] [place], [place] [address]    Special Requests   Final    Normal Performed at [facility], [address], [place], [place] [address]    Gram Stain   Final    ABUNDANT WBC PRESENT,BOTH PMN AND MONONUCLEAR ABUNDANT GRAM POSITIVE COCCI ABUNDANT GRAM NEGATIVE RODS FEW GRAM VARIABLE ROD    Culture   Final    ABUNDANT ESCHERICHIA COLI HOLDING FOR POSSIBLE ANAEROBE Performed at [facility], [address] [place], [place] [address]    Report Status PENDING  Incomplete   Organism ID, Bacteria ESCHERICHIA COLI  Final      Susceptibility   Escherichia coli - MIC*    AMPICILLIN 8 SENSITIVE Sensitive     CEFAZOLIN <=4 SENSITIVE Sensitive     CEFEPIME <=1 SENSITIVE Sensitive     CEFTAZIDIME <=1 SENSITIVE Sensitive     CEFTRIAXONE <=1 SENSITIVE Sensitive     CIPROFLOXACIN >=4  RESISTANT Resistant     GENTAMICIN <=1 SENSITIVE Sensitive     IMIPENEM <=0.25 SENSITIVE Sensitive     TRIMETH/SULFA <=20 SENSITIVE Sensitive     AMPICILLIN/SULBACTAM 4 SENSITIVE Sensitive     PIP/TAZO <=4 SENSITIVE Sensitive     Extended ESBL NEGATIVE Sensitive     * ABUNDANT ESCHERICHIA COLI    Radiology Reports Ct Angio Chest Pe W Or Wo Contrast  Result Date: 07/16/2019 CLINICAL DATA:  Acute onset of shortness of breath today EXAM: CT ANGIOGRAPHY CHEST WITH CONTRAST TECHNIQUE: Multidetector CT imaging of the chest was performed using the standard protocol during bolus administration of intravenous contrast. Multiplanar CT image reconstructions and MIPs were obtained to evaluate the vascular anatomy. CONTRAST:  80mL OMNIPAQUE IOHEXOL 350 MG/ML SOLN COMPARISON:  Chest x-ray 07/07/2019 FINDINGS: Cardiovascular: Satisfactory opacification of the pulmonary arteries to the segmental level. No evidence of pulmonary embolism. Normal heart size. Tiny pericardial effusion. Mediastinum/Nodes: No enlarged mediastinal, hilar, or axillary lymph nodes. Thyroid gland, trachea, and esophagus demonstrate no significant findings. Lungs/Pleura: Patient has extensive patchy bilateral pulmonary infiltrates primarily in the upper lobes. Small bilateral pleural effusions, slightly greater on the left. Slight atelectasis in the right middle lobe and in the right lower lobe. Upper Abdomen: Free air under the right hemidiaphragm. See CT scan of the abdomen report for further information. Musculoskeletal: No chest wall abnormality. No acute or significant osseous findings. Review of the MIP images confirms the above findings. IMPRESSION: 1. No pulmonary emboli. 2. Extensive patchy bilateral pulmonary infiltrates primarily in the upper lobes. 3. Small bilateral pleural effusions, slightly greater on the left. 4. Free air under the right hemidiaphragm. See CT scan of the abdomen report for further information. Electronically  Signed   By: Francene Boyers M.D.   On: 07/16/2019 21:25   Ct Abdomen Pelvis W Contrast  Result Date: 07/16/2019 CLINICAL DATA:  Abdominal pain and recurrent fever EXAM: CT ABDOMEN AND PELVIS WITH CONTRAST TECHNIQUE: Multidetector CT imaging of the abdomen and pelvis was performed using the standard protocol following bolus administration of intravenous contrast. CONTRAST:  OMNIPAQUE IOHEXOL 300 MG/ML  SOLN COMPARISON:  07/11/2019 FINDINGS: Lower chest: Bilateral small effusions are noted which have increased somewhat in the interval from the prior exam. Patchy infiltrate is again identified in the lung bases but has increased somewhat from the prior exams particularly in the upper lobes bilaterally. Hepatobiliary: The liver is within normal limits. The gallbladder is well distended with multiple gallstones within. Pancreas: Unremarkable. No pancreatic ductal dilatation or surrounding inflammatory changes. Spleen: Normal in size without focal abnormality. Adrenals/Urinary Tract: Adrenal glands are within normal limits. Kidneys demonstrate a normal enhancement pattern bilaterally. Delayed images demonstrate normal excretion without obstructive change. The bladder is partially distended. Stomach/Bowel: There again noted changes consistent with diverticular disease throughout the colon. Adjacent to the sigmoid colon however in an area of previous inflammation there is now an air-fluid collection that measures at least 7.8 x 4.3 cm in dimension within air-fluid level within consistent with a pericolonic abscess. Additionally there are findings along the ascending colon suggestive of rupture of the ascending colon with increase in intraperitoneal air. A large air fluid collection is identified in the right mid abdomen which measures at least 13 by 7 cm in dimension best seen on image number 65 of series 2. This lies adjacent to the inflamed ascending colon and is also consistent with a pericolonic abscess.  Additionally communicating with this large collection is a third air-fluid collection which lies along the lateral aspect of the right lobe of the liver causing mass effect upon the adjacent liver measuring 15 x 6.2 cm in greatest dimension. The appendix is within normal limits. The small bowel is also unremarkable. The stomach is within normal limits. Vascular/Lymphatic: No significant vascular findings are present. No enlarged abdominal or pelvic lymph nodes. Reproductive: Uterus and bilateral adnexa are unremarkable. Other: No abdominal wall hernia or abnormality. No abdominopelvic ascites. Musculoskeletal: Degenerative changes of the lumbar spine are seen. IMPRESSION: There are changes consistent with progressive inflammatory change involving the colon primarily within the ascending colon in an area where prior small pericolonic air collections were noted. This results in a large somewhat bilobed air-fluid collection which extends along the lateral aspect of the liver as well as anterior to the ascending colon. It is possible the collection anterior to the ascending colon is dilatation of the ascending colon although felt to be less likely given the overall appearance. A third collection is noted along the course of the sigmoid colon consistent with increasing pericolonic abscess as described. Bilateral pleural effusions which have increased in the interval from the prior exam. Additionally increasing patchy infiltrate is noted throughout both lungs primarily within the bases although now seen within the upper lobes bilaterally. These results will be called to the ordering clinician or representative by the Radiologist Assistant, and communication documented in the PACS or zVision Dashboard. Electronically Signed   By: Alcide Clever M.D.   On: 07/16/2019 19:49   Ct Abdomen Pelvis W Contrast  Addendum Date: 07/11/2019   ADDENDUM REPORT: 07/11/2019 20:01 ADDENDUM: Critical Value/emergent results were called by  telephone at the time of interpretation on 07/11/2019 at 8:01 pm to providerDR. Romie Levee , who verbally acknowledged these results. Electronically Signed   By: Charlett Nose M.D.   On: 07/11/2019 20:01   Result Date: 07/11/2019 CLINICAL DATA:  Abdominal pain EXAM: CT ABDOMEN AND PELVIS WITH CONTRAST TECHNIQUE: Multidetector CT imaging of the abdomen and pelvis was performed using the standard protocol following bolus administration of intravenous contrast. CONTRAST:  OMNIPAQUE IOHEXOL 300 MG/ML SOLN, 30mL OMNIPAQUE  IOHEXOL 300 MG/ML SOLN COMPARISON:  07/07/2019 FINDINGS: Lower chest: Trace bilateral pleural effusions. Bibasilar atelectasis. Heart is normal size. Hepatobiliary: Gallstones within the gallbladder. No focal hepatic abnormality. Pancreas: No focal abnormality or ductal dilatation. Spleen: No focal abnormality.  Normal size. Adrenals/Urinary Tract: No adrenal abnormality. No focal renal abnormality. No stones or hydronephrosis. Urinary bladder is unremarkable. Stomach/Bowel: Extensive colonic diverticulosis. Stranding/inflammation around the sigmoid colon has progressed since prior study. There is also abnormal stranding along the anterior aspect of the ascending colon which is new since prior study. Increasing pneumoperitoneum with locules of gas throughout the peritoneum. Is difficult to determine if the source is the sigmoid colon or the ascending colon. Appendix is normal. Stomach and small bowel decompressed. Vascular/Lymphatic: No evidence of aneurysm or adenopathy. Reproductive: Uterus and adnexa unremarkable.  No mass. Other: Small amount of perihepatic ascites. Musculoskeletal: No acute bony abnormality. IMPRESSION: Diffuse colonic diverticulosis. Worsening inflammation around the sigmoid colon with new inflammation along the anterior wall of the ascending colon. There is increasing pneumoperitoneum throughout the abdomen and pelvis along with new perihepatic ascites. Findings  compatible with perforation. It is difficult to determine exact source of free air. The majority of the free intraperitoneal air is centered anterior to the ascending colon and liver suggesting the possibility that the ascending colon is the source. Cholelithiasis. Trace bilateral effusions.  Bibasilar atelectasis. Electronically Signed: By: Charlett Nose M.D. On: 07/11/2019 19:32   Ct Abdomen Pelvis W Contrast  Result Date: 07/07/2019 CLINICAL DATA:  Abdominal pain diarrhea EXAM: CT ABDOMEN AND PELVIS WITH CONTRAST TECHNIQUE: Multidetector CT imaging of the abdomen and pelvis was performed using the standard protocol following bolus administration of intravenous contrast. CONTRAST:  OMNIPAQUE IOHEXOL 300 MG/ML  SOLN COMPARISON:  None. FINDINGS: Lower chest: Lung bases demonstrate no acute consolidation or effusion. The heart size is within normal limits. Hepatobiliary: No focal hepatic abnormality or biliary dilatation. Increased intraluminal density within the gallbladder. Pancreas: Unremarkable. No pancreatic ductal dilatation or surrounding inflammatory changes. Spleen: Normal in size without focal abnormality. Adrenals/Urinary Tract: Adrenal glands are unremarkable. Kidneys are normal, without renal calculi, focal lesion, or hydronephrosis. Bladder is unremarkable. Stomach/Bowel: The stomach is nonenlarged. No dilated small bowel. Nondilated appendix. Hyperdense focus at the cecum near the origin of the appendix, possible diverticulum versus small appendicoliths but no evidence for appendiceal inflammatory change. Fluid within the colon. Sigmoid colon diverticular disease with wall thickening and inflammatory change consistent with acute diverticulitis. Multiple foci of extraluminal gas at the inflamed sigmoid colon. Vascular/Lymphatic: Nonaneurysmal aorta. No significantly enlarged lymph nodes. Reproductive: Uterus and bilateral adnexa are unremarkable. Other: Small foci of extraluminal gas within  the right lower quadrant adjacent to the inflamed sigmoid colon. Tiny foci of gas within the right upper quadrant and overlying the dome of the liver. Musculoskeletal: No acute or suspicious osseous abnormality. There are degenerative changes. IMPRESSION: 1. Focal inflammatory changes and wall thickening involving the distal sigmoid colon consistent with acute diverticulitis. Multiple small foci of gas adjacent to the inflamed sigmoid colon with small foci of free air adjacent to the liver, consistent with perforation. Negative for organized abscess at this time. 2. Increased intraluminal density within the gallbladder, possible stones or sludge. Critical Value/emergent results were called by telephone at the time of interpretation on 07/07/2019 at 11:12 pm to providerBOWIE TRAN , who verbally acknowledged these results. Electronically Signed   By: Jasmine Pang M.D.   On: 07/07/2019 23:12   Dg Chest Portable 1 View  Result Date:  07/07/2019 CLINICAL DATA:  Fever, recent COVID exposure. EXAM: PORTABLE CHEST 1 VIEW COMPARISON:  04/24/2013 FINDINGS: The heart size and mediastinal contours are within normal limits. Both lungs are clear. The visualized skeletal structures are unremarkable. IMPRESSION: No active disease. Electronically Signed   By: Charlett Nose M.D.   On: 07/07/2019 19:45   Ct Image Guided Drainage By Percutaneous Catheter  Result Date: 07/18/2019 INDICATION: Colonic perforation of indeterminate source now with multiple intra-abdominal mixed fluid though predominantly air containing collections within the abdomen. Active COVID-19 infection. Please perform CT-guided percutaneous drainage catheter(s) placement(s) for infection source control purposes. EXAM: CT IMAGE GUIDED DRAINAGE BY PERCUTANEOUS CATHETER x2 COMPARISON:  CT abdomen pelvis - 07/11/2019; 07/07/2019 MEDICATIONS: The patient is currently admitted to the hospital and receiving intravenous antibiotics. The antibiotics were administered  within an appropriate time frame prior to the initiation of the procedure. ANESTHESIA/SEDATION: Moderate (conscious) sedation was employed during this procedure. A total of Versed 6 mg and Fentanyl 300 mcg was administered intravenously. Moderate Sedation Time: 50 minutes. The patient's level of consciousness and vital signs were monitored continuously by radiology nursing throughout the procedure under my direct supervision. CONTRAST:  None COMPLICATIONS: None immediate. PROCEDURE: Informed written consent was obtained from the patient after a discussion of the risks, benefits and alternatives to treatment. The patient was placed supine on the CT gantry and a pre procedural CT was performed re-demonstrating the known abscesses/air and fluid collections within the with dominant collection within the right upper abdominal quadrant measuring approximately 14.8 x 6.3 cm (image 50, series 2) dominant predominantly air collection within right lower quadrant measuring approximately 15.5 x 8.3 cm (image 72, series 2) and dominant mixed and air and fluid containing collection within the midline of the lower abdomen/pelvis measuring approximately 9.7 x 5.1 cm (image 97, series 2). The procedure was planned. A timeout was performed prior to the initiation of the procedure. The right upper and mid abdomen as well as the left lower abdomen was was prepped and draped in the usual sterile fashion. The overlying soft tissues were anesthetized with 1% lidocaine with epinephrine. 18 gauge trocar needles were advanced into each fluid collection in short Amplatz wires were coiled within each separate collection. Next, sequentially, each track was serially dilated allowing placement of 12 French percutaneous drainage catheters under intermittent CT fluoroscopic guidance. Completion CT imaging was obtained and drainage catheters were retracted into the dominant component of each air and fluid containing collection. Postprocedural CT  imaging was obtained. A total of approximately 45 cc of feculent appearing fluid was aspirated from all 3 drains, with the largest amount aspirated from the left lower quadrant percutaneous drainage catheter. The drainage catheter within the left lower abdominal quadrant was connected to a gravity bag while the other 2 percutaneous drainage catheters were connected to JP bulbs. All drainage catheters were secured at the skin entrance site within interrupted sutures and Stat Lock devices. Dressings were applied. The patient tolerated the procedure well without immediate postprocedural complication. IMPRESSION: Technically successful CT-guided placement of 3 separate percutaneous drainage catheters into dominant mixed fluid though predominantly air containing collections within the right upper and mid abdomen as well as the left lower abdominal quadrant. Electronically Signed   By: Simonne Come M.D.   On: 07/18/2019 13:05   Ct Image Guided Drainage By Percutaneous Catheter  Result Date: 07/18/2019 INDICATION: Colonic perforation of indeterminate source now with multiple intra-abdominal mixed fluid though predominantly air containing collections within the abdomen. Active COVID-19 infection. Please  perform CT-guided percutaneous drainage catheter(s) placement(s) for infection source control purposes. EXAM: CT IMAGE GUIDED DRAINAGE BY PERCUTANEOUS CATHETER x2 COMPARISON:  CT abdomen pelvis - 07/11/2019; 07/07/2019 MEDICATIONS: The patient is currently admitted to the hospital and receiving intravenous antibiotics. The antibiotics were administered within an appropriate time frame prior to the initiation of the procedure. ANESTHESIA/SEDATION: Moderate (conscious) sedation was employed during this procedure. A total of Versed 6 mg and Fentanyl 300 mcg was administered intravenously. Moderate Sedation Time: 50 minutes. The patient's level of consciousness and vital signs were monitored continuously by radiology nursing  throughout the procedure under my direct supervision. CONTRAST:  None COMPLICATIONS: None immediate. PROCEDURE: Informed written consent was obtained from the patient after a discussion of the risks, benefits and alternatives to treatment. The patient was placed supine on the CT gantry and a pre procedural CT was performed re-demonstrating the known abscesses/air and fluid collections within the with dominant collection within the right upper abdominal quadrant measuring approximately 14.8 x 6.3 cm (image 50, series 2) dominant predominantly air collection within right lower quadrant measuring approximately 15.5 x 8.3 cm (image 72, series 2) and dominant mixed and air and fluid containing collection within the midline of the lower abdomen/pelvis measuring approximately 9.7 x 5.1 cm (image 97, series 2). The procedure was planned. A timeout was performed prior to the initiation of the procedure. The right upper and mid abdomen as well as the left lower abdomen was was prepped and draped in the usual sterile fashion. The overlying soft tissues were anesthetized with 1% lidocaine with epinephrine. 18 gauge trocar needles were advanced into each fluid collection in short Amplatz wires were coiled within each separate collection. Next, sequentially, each track was serially dilated allowing placement of 12 French percutaneous drainage catheters under intermittent CT fluoroscopic guidance. Completion CT imaging was obtained and drainage catheters were retracted into the dominant component of each air and fluid containing collection. Postprocedural CT imaging was obtained. A total of approximately 45 cc of feculent appearing fluid was aspirated from all 3 drains, with the largest amount aspirated from the left lower quadrant percutaneous drainage catheter. The drainage catheter within the left lower abdominal quadrant was connected to a gravity bag while the other 2 percutaneous drainage catheters were connected to JP  bulbs. All drainage catheters were secured at the skin entrance site within interrupted sutures and Stat Lock devices. Dressings were applied. The patient tolerated the procedure well without immediate postprocedural complication. IMPRESSION: Technically successful CT-guided placement of 3 separate percutaneous drainage catheters into dominant mixed fluid though predominantly air containing collections within the right upper and mid abdomen as well as the left lower abdominal quadrant. Electronically Signed   By: Simonne Come M.D.   On: 07/18/2019 13:05   Ct Image Guided Drainage By Percutaneous Catheter  Result Date: 07/18/2019 INDICATION: Colonic perforation of indeterminate source now with multiple intra-abdominal mixed fluid though predominantly air containing collections within the abdomen. Active COVID-19 infection. Please perform CT-guided percutaneous drainage catheter(s) placement(s) for infection source control purposes. EXAM: CT IMAGE GUIDED DRAINAGE BY PERCUTANEOUS CATHETER x2 COMPARISON:  CT abdomen pelvis - 07/11/2019; 07/07/2019 MEDICATIONS: The patient is currently admitted to the hospital and receiving intravenous antibiotics. The antibiotics were administered within an appropriate time frame prior to the initiation of the procedure. ANESTHESIA/SEDATION: Moderate (conscious) sedation was employed during this procedure. A total of Versed 6 mg and Fentanyl 300 mcg was administered intravenously. Moderate Sedation Time: 50 minutes. The patient's level of consciousness and vital signs  were monitored continuously by radiology nursing throughout the procedure under my direct supervision. CONTRAST:  None COMPLICATIONS: None immediate. PROCEDURE: Informed written consent was obtained from the patient after a discussion of the risks, benefits and alternatives to treatment. The patient was placed supine on the CT gantry and a pre procedural CT was performed re-demonstrating the known abscesses/air and  fluid collections within the with dominant collection within the right upper abdominal quadrant measuring approximately 14.8 x 6.3 cm (image 50, series 2) dominant predominantly air collection within right lower quadrant measuring approximately 15.5 x 8.3 cm (image 72, series 2) and dominant mixed and air and fluid containing collection within the midline of the lower abdomen/pelvis measuring approximately 9.7 x 5.1 cm (image 97, series 2). The procedure was planned. A timeout was performed prior to the initiation of the procedure. The right upper and mid abdomen as well as the left lower abdomen was was prepped and draped in the usual sterile fashion. The overlying soft tissues were anesthetized with 1% lidocaine with epinephrine. 18 gauge trocar needles were advanced into each fluid collection in short Amplatz wires were coiled within each separate collection. Next, sequentially, each track was serially dilated allowing placement of 12 French percutaneous drainage catheters under intermittent CT fluoroscopic guidance. Completion CT imaging was obtained and drainage catheters were retracted into the dominant component of each air and fluid containing collection. Postprocedural CT imaging was obtained. A total of approximately 45 cc of feculent appearing fluid was aspirated from all 3 drains, with the largest amount aspirated from the left lower quadrant percutaneous drainage catheter. The drainage catheter within the left lower abdominal quadrant was connected to a gravity bag while the other 2 percutaneous drainage catheters were connected to JP bulbs. All drainage catheters were secured at the skin entrance site within interrupted sutures and Stat Lock devices. Dressings were applied. The patient tolerated the procedure well without immediate postprocedural complication. IMPRESSION: Technically successful CT-guided placement of 3 separate percutaneous drainage catheters into dominant mixed fluid though  predominantly air containing collections within the right upper and mid abdomen as well as the left lower abdominal quadrant. Electronically Signed   By: Simonne Come M.D.   On: 07/18/2019 13:05   [place] Ekg Site Rite  Result Date: 07/17/2019 If Site Rite image not attached, placement could not be confirmed due to current cardiac rhythm.   Time Spent in minutes 35   Hazell Siwik M.D on 07/20/2019 at 11:54 AM  Between 7am to 7pm - Pager - [phone]  After 7pm go to [email or web address] - password [facility]  [facility] -  Office  [phone]

## 2019-07-20 NOTE — Progress Notes (Signed)
Pharmacy Antibiotic Note  Holly Moon is a 48 y.o. female admitted on 07/07/2019 with  abd pain, acute sigmoid diverticulitis with pneumoperitoneum, ascites .  Pharmacy has been consulted for Vancomycin dosing for new bilateral lung infiltrates, PNA vs Covid (admit Covid neg)  Plan: Continue  Zosyn 3.375gm q8, 4 hr infusion   Dosage will likely remain stable at above dosage  and need for further dosage adjustment appears unlikely at present.    Will sign off at this time.  Please reconsult if a change in clinical status warrants re-evaluation of dosage.     Height: 5\' 5"  (165.1 cm) Weight: 293 lb 3.4 oz (133 kg) IBW/kg (Calculated) : 57  Temp (24hrs), Avg:98.2 F (36.8 C), Min:97.8 F (36.6 C), Max:98.9 F (37.2 C)  Recent Labs  Lab 07/16/19 0548 07/16/19 1433 07/17/19 0615 07/17/19 1034 07/18/19 0449 07/19/19 0416 07/20/19 0510  WBC 11.5*  --  16.7*  --  12.2* 8.1 6.3  CREATININE  --  0.69  --  0.72 0.58 0.52 0.57    Estimated Creatinine Clearance: 118.7 mL/min (by C-G formula based on SCr of 0.57 mg/dL).    No Known Allergies  Antimicrobials this admission: 11/15 Zosyn EI >> 11/24 remdesivir >>  11/25 vancomycin >>  Dose adjustments this admission:  Microbiology results: 11/24 BCx: ngtd 11/25 abd abscess: abundant GPC, GNR, few GVR 11/24 Covid: positive  Thank you for allowing pharmacy to be a part of this patient's care.   Royetta Asal, PharmD, BCPS 07/20/2019 12:04 PM

## 2019-07-20 NOTE — Progress Notes (Signed)
Subjective: No complaints. Seems to be feeling better  Objective: Vital signs in last 24 hours: Temp:  [97.8 F (36.6 C)-98.9 F (37.2 C)] 97.8 F (36.6 C) (11/27 0504) Pulse Rate:  [41-64] 51 (11/27 0504) Resp:  [16-20] 20 (11/27 0504) BP: (155-163)/(66-98) 158/98 (11/27 0504) SpO2:  [85 %-99 %] 93 % (11/27 0000) Last BM Date: 07/19/19  Intake/Output from previous day: 11/26 0701 - 11/27 0700 In: 3068.3 [P.O.:240; I.V.:1978.9; IV Piggyback:779.4] Out: 560 [Urine:400; Drains:160] Intake/Output this shift: No intake/output data recorded.  General appearance: alert and cooperative Resp: clear to auscultation bilaterally and productive cough Cardio: regular rate and rhythm GI: soft, mild LLQ tenderness  Lab Results:  Recent Labs    07/19/19 0416 07/20/19 0510  WBC 8.1 6.3  HGB 10.4* 11.0*  HCT 33.4* 33.9*  PLT 457* 486*   BMET Recent Labs    07/19/19 0416 07/20/19 0510  NA 133* 136  K 4.3 4.3  CL 105 105  CO2 22 23  GLUCOSE 152* 146*  BUN 16 16  CREATININE 0.52 0.57  CALCIUM 7.3* 8.2*   PT/INR Recent Labs    07/17/19 1601 07/18/19 0449  LABPROT 18.0* 17.8*  INR 1.5* 1.5*   ABG No results for input(s): PHART, HCO3 in the last 72 hours.  Invalid input(s): PCO2, PO2  Studies/Results: Ct Image Guided Drainage By Percutaneous Catheter  Result Date: 07/18/2019 INDICATION: Colonic perforation of indeterminate source now with multiple intra-abdominal mixed fluid though predominantly air containing collections within the abdomen. Active COVID-19 infection. Please perform CT-guided percutaneous drainage catheter(s) placement(s) for infection source control purposes. EXAM: CT IMAGE GUIDED DRAINAGE BY PERCUTANEOUS CATHETER x2 COMPARISON:  CT abdomen pelvis - 07/11/2019; 07/07/2019 MEDICATIONS: The patient is currently admitted to the hospital and receiving intravenous antibiotics. The antibiotics were administered within an appropriate time frame prior to  the initiation of the procedure. ANESTHESIA/SEDATION: Moderate (conscious) sedation was employed during this procedure. A total of Versed 6 mg and Fentanyl 300 mcg was administered intravenously. Moderate Sedation Time: 50 minutes. The patient's level of consciousness and vital signs were monitored continuously by radiology nursing throughout the procedure under my direct supervision. CONTRAST:  None COMPLICATIONS: None immediate. PROCEDURE: Informed written consent was obtained from the patient after a discussion of the risks, benefits and alternatives to treatment. The patient was placed supine on the CT gantry and a pre procedural CT was performed re-demonstrating the known abscesses/air and fluid collections within the with dominant collection within the right upper abdominal quadrant measuring approximately 14.8 x 6.3 cm (image 50, series 2) dominant predominantly air collection within right lower quadrant measuring approximately 15.5 x 8.3 cm (image 72, series 2) and dominant mixed and air and fluid containing collection within the midline of the lower abdomen/pelvis measuring approximately 9.7 x 5.1 cm (image 97, series 2). The procedure was planned. A timeout was performed prior to the initiation of the procedure. The right upper and mid abdomen as well as the left lower abdomen was was prepped and draped in the usual sterile fashion. The overlying soft tissues were anesthetized with 1% lidocaine with epinephrine. 18 gauge trocar needles were advanced into each fluid collection in short Amplatz wires were coiled within each separate collection. Next, sequentially, each track was serially dilated allowing placement of 12 French percutaneous drainage catheters under intermittent CT fluoroscopic guidance. Completion CT imaging was obtained and drainage catheters were retracted into the dominant component of each air and fluid containing collection. Postprocedural CT imaging was obtained.  A total of  approximately 45 cc of feculent appearing fluid was aspirated from all 3 drains, with the largest amount aspirated from the left lower quadrant percutaneous drainage catheter. The drainage catheter within the left lower abdominal quadrant was connected to a gravity bag while the other 2 percutaneous drainage catheters were connected to JP bulbs. All drainage catheters were secured at the skin entrance site within interrupted sutures and Stat Lock devices. Dressings were applied. The patient tolerated the procedure well without immediate postprocedural complication. IMPRESSION: Technically successful CT-guided placement of 3 separate percutaneous drainage catheters into dominant mixed fluid though predominantly air containing collections within the right upper and mid abdomen as well as the left lower abdominal quadrant. Electronically Signed   By: Simonne ComeJohn  Watts M.D.   On: 07/18/2019 13:05   Ct Image Guided Drainage By Percutaneous Catheter  Result Date: 07/18/2019 INDICATION: Colonic perforation of indeterminate source now with multiple intra-abdominal mixed fluid though predominantly air containing collections within the abdomen. Active COVID-19 infection. Please perform CT-guided percutaneous drainage catheter(s) placement(s) for infection source control purposes. EXAM: CT IMAGE GUIDED DRAINAGE BY PERCUTANEOUS CATHETER x2 COMPARISON:  CT abdomen pelvis - 07/11/2019; 07/07/2019 MEDICATIONS: The patient is currently admitted to the hospital and receiving intravenous antibiotics. The antibiotics were administered within an appropriate time frame prior to the initiation of the procedure. ANESTHESIA/SEDATION: Moderate (conscious) sedation was employed during this procedure. A total of Versed 6 mg and Fentanyl 300 mcg was administered intravenously. Moderate Sedation Time: 50 minutes. The patient's level of consciousness and vital signs were monitored continuously by radiology nursing throughout the procedure under  my direct supervision. CONTRAST:  None COMPLICATIONS: None immediate. PROCEDURE: Informed written consent was obtained from the patient after a discussion of the risks, benefits and alternatives to treatment. The patient was placed supine on the CT gantry and a pre procedural CT was performed re-demonstrating the known abscesses/air and fluid collections within the with dominant collection within the right upper abdominal quadrant measuring approximately 14.8 x 6.3 cm (image 50, series 2) dominant predominantly air collection within right lower quadrant measuring approximately 15.5 x 8.3 cm (image 72, series 2) and dominant mixed and air and fluid containing collection within the midline of the lower abdomen/pelvis measuring approximately 9.7 x 5.1 cm (image 97, series 2). The procedure was planned. A timeout was performed prior to the initiation of the procedure. The right upper and mid abdomen as well as the left lower abdomen was was prepped and draped in the usual sterile fashion. The overlying soft tissues were anesthetized with 1% lidocaine with epinephrine. 18 gauge trocar needles were advanced into each fluid collection in short Amplatz wires were coiled within each separate collection. Next, sequentially, each track was serially dilated allowing placement of 12 French percutaneous drainage catheters under intermittent CT fluoroscopic guidance. Completion CT imaging was obtained and drainage catheters were retracted into the dominant component of each air and fluid containing collection. Postprocedural CT imaging was obtained. A total of approximately 45 cc of feculent appearing fluid was aspirated from all 3 drains, with the largest amount aspirated from the left lower quadrant percutaneous drainage catheter. The drainage catheter within the left lower abdominal quadrant was connected to a gravity bag while the other 2 percutaneous drainage catheters were connected to JP bulbs. All drainage catheters were  secured at the skin entrance site within interrupted sutures and Stat Lock devices. Dressings were applied. The patient tolerated the procedure well without immediate postprocedural complication. IMPRESSION: Technically successful CT-guided placement  of 3 separate percutaneous drainage catheters into dominant mixed fluid though predominantly air containing collections within the right upper and mid abdomen as well as the left lower abdominal quadrant. Electronically Signed   By: Simonne Come M.D.   On: 07/18/2019 13:05   Ct Image Guided Drainage By Percutaneous Catheter  Result Date: 07/18/2019 INDICATION: Colonic perforation of indeterminate source now with multiple intra-abdominal mixed fluid though predominantly air containing collections within the abdomen. Active COVID-19 infection. Please perform CT-guided percutaneous drainage catheter(s) placement(s) for infection source control purposes. EXAM: CT IMAGE GUIDED DRAINAGE BY PERCUTANEOUS CATHETER x2 COMPARISON:  CT abdomen pelvis - 07/11/2019; 07/07/2019 MEDICATIONS: The patient is currently admitted to the hospital and receiving intravenous antibiotics. The antibiotics were administered within an appropriate time frame prior to the initiation of the procedure. ANESTHESIA/SEDATION: Moderate (conscious) sedation was employed during this procedure. A total of Versed 6 mg and Fentanyl 300 mcg was administered intravenously. Moderate Sedation Time: 50 minutes. The patient's level of consciousness and vital signs were monitored continuously by radiology nursing throughout the procedure under my direct supervision. CONTRAST:  None COMPLICATIONS: None immediate. PROCEDURE: Informed written consent was obtained from the patient after a discussion of the risks, benefits and alternatives to treatment. The patient was placed supine on the CT gantry and a pre procedural CT was performed re-demonstrating the known abscesses/air and fluid collections within the with  dominant collection within the right upper abdominal quadrant measuring approximately 14.8 x 6.3 cm (image 50, series 2) dominant predominantly air collection within right lower quadrant measuring approximately 15.5 x 8.3 cm (image 72, series 2) and dominant mixed and air and fluid containing collection within the midline of the lower abdomen/pelvis measuring approximately 9.7 x 5.1 cm (image 97, series 2). The procedure was planned. A timeout was performed prior to the initiation of the procedure. The right upper and mid abdomen as well as the left lower abdomen was was prepped and draped in the usual sterile fashion. The overlying soft tissues were anesthetized with 1% lidocaine with epinephrine. 18 gauge trocar needles were advanced into each fluid collection in short Amplatz wires were coiled within each separate collection. Next, sequentially, each track was serially dilated allowing placement of 12 French percutaneous drainage catheters under intermittent CT fluoroscopic guidance. Completion CT imaging was obtained and drainage catheters were retracted into the dominant component of each air and fluid containing collection. Postprocedural CT imaging was obtained. A total of approximately 45 cc of feculent appearing fluid was aspirated from all 3 drains, with the largest amount aspirated from the left lower quadrant percutaneous drainage catheter. The drainage catheter within the left lower abdominal quadrant was connected to a gravity bag while the other 2 percutaneous drainage catheters were connected to JP bulbs. All drainage catheters were secured at the skin entrance site within interrupted sutures and Stat Lock devices. Dressings were applied. The patient tolerated the procedure well without immediate postprocedural complication. IMPRESSION: Technically successful CT-guided placement of 3 separate percutaneous drainage catheters into dominant mixed fluid though predominantly air containing collections  within the right upper and mid abdomen as well as the left lower abdominal quadrant. Electronically Signed   By: Simonne Come M.D.   On: 07/18/2019 13:05    Anti-infectives: Anti-infectives (From admission, onward)   Start     Dose/Rate Route Frequency Ordered Stop   07/19/19 1000  remdesivir 100 mg in sodium chloride 0.9 % 250 mL IVPB     100 mg 500 mL/hr over 30 Minutes Intravenous  Daily 07/17/19 1822 07/22/19 0959   07/18/19 1600  remdesivir 100 mg in sodium chloride 0.9 % 250 mL IVPB     100 mg 500 mL/hr over 30 Minutes Intravenous  Once 07/17/19 1822 07/18/19 1615   07/18/19 0600  vancomycin (VANCOCIN) 1,250 mg in sodium chloride 0.9 % 250 mL IVPB  Status:  Discontinued     1,250 mg 166.7 mL/hr over 90 Minutes Intravenous Every 12 hours 07/17/19 1055 07/19/19 1319   07/17/19 1830  remdesivir 200 mg in sodium chloride 0.9 % 250 mL IVPB     200 mg 500 mL/hr over 30 Minutes Intravenous Once 07/17/19 1822 07/17/19 2314   07/17/19 1100  vancomycin (VANCOCIN) 2,500 mg in sodium chloride 0.9 % 500 mL IVPB     2,500 mg 250 mL/hr over 120 Minutes Intravenous  Once 07/17/19 1004 07/17/19 2037   07/08/19 0600  piperacillin-tazobactam (ZOSYN) IVPB 3.375 g     3.375 g 12.5 mL/hr over 240 Minutes Intravenous Every 8 hours 07/08/19 0129     07/07/19 2315  piperacillin-tazobactam (ZOSYN) IVPB 3.375 g     3.375 g 100 mL/hr over 30 Minutes Intravenous  Once 07/07/19 2314 07/07/19 2356      Assessment/Plan: s/p  Advance diet. Start fulls today Continue abx and drain for perforated diverticulitis  Covid positive:07/17/2019 (Covidnegative:07/07/2019) Hx IBS/questionable Crohn's disease AKI Hypertension Fibromyalgia-generalized Back,hip,and leg pain Hx PE-off anticoagulants Tobacco use EtOH use Morbid obesity-BMI 48.79 Extensive bilateral patchy pulmonary infiltrates primarlyupper lobes; small bilateral effusions - Medicine consult Severe malnutrition - prealbumin <5  Acute  sigmoid diverticulitis -CT 07/11/2019: Diffuse colonic diverticulosis, worsening inflammation around the sigmoid colon with new inflammation along the anterior wall of the ascending colon increased pneumoperitoneum throughout the abdomen pelvis along with perihepatic ascites.  -Fever 11/21 and 11/23 -Repeat CT 11/23: Diverticular disease with 7.8 x 4.3 cm air-fluid collection; ascending colon air/fluid collection 13 x 7 cm also consistent with possible pericolonic abscess, a third collection lateral aspect the right lobe of the liver measuring 15 x 6.2 causing mass-effect upon the liver. -CT also showed bilateral pleural effusions which have increased since the last CT. Additional patchy infiltrate noted throughout both lungs primarily within the bases although now seen in the upper lobes bilaterally. -CT of the chest 11/23: No pulmonary emboli, extensive patchy bilateral pulmonary infiltrates primarily in the upper lobes. Small bilateral pleural effusions left greater than right free air under the right hemidiaphragm - Plan IR drain placement 11/25   FEN: IV fluids/clear liquids ID: Zosyn [id number]/[id number] DVT: SCDs only;Lovenox Follow-up: TBD   Plan: I have ask [facility] to consider transferring her to their service as Primary due to her COVID. We will continue to follow and assist with the diverticulitis. Continue drains and abx  LOS: 12 days    Chevis Pretty III 11/27/2020Patient ID: Holly Moon, female   DOB: 1970/10/05, 48 y.o.   MRN: [id number]

## 2019-07-20 NOTE — Progress Notes (Addendum)
Patient ID: Holly Moon, female   DOB: 1970/10/25, 48 y.o.   MRN: [id number] Patient status post drainage of multiple abdominal abscesses on 11/25, specifically right mid and lower abdominal and left lower abdominal regions Currently afebrile, WBC normal, hemoglobin stable, creatinine normal, drain fluid cultures growing E. coli.  Outputs from each have been between 20 to 25 cc today . Recommend continued drain flushes and once outputs are less than 10 cc/day for 2-3 consecutive days or if clinical status worsens obtain f/u CT; may need drain injections as well before removal. Other plans as per surgery/[facility].

## 2019-07-20 NOTE — Progress Notes (Signed)
[place] NOTE   Pharmacy Consult for TPN Indication:  Intra-abd infection  HPI:  40 yoF admit 11/14 with abd pain, acute sigmoid diverticulitis with pneumoperitoneum, ascites  TPN Access: PICC TPN start date: 11/24 11/27 - clarified with surgeon to continue TPN.    Patient Measurements: Estimated body mass index is 48.79 kg/m as calculated from the following:   Height as of this encounter: 5\' 5"  (1.651 m).   Weight as of this encounter: 293 lb 3.4 oz (133 kg).  Estimated Nutritional Needs - Per RD recommendations on 11/24 Kcal:  1995-2260 kcal Protein:  100-113 grams Fluid:  >/= 2 L/day  Rx target rate at 85 ml/hr will yield: 112 gm protein, 2105 kCal per day    Recent Labs    07/18/19 0449 07/19/19 0416 07/20/19 0510  NA 136 133* 136  K 3.8 4.3 4.3  CL 105 105 105  CO2 24 22 23   GLUCOSE 151* 152* 146*  BUN 14 16 16   CREATININE 0.58 0.52 0.57  CALCIUM 7.6* 7.3* 8.2*  PHOS 3.3 2.6 3.0  MG 2.2 2.1  --   ALBUMIN 2.2* 2.1*  --   ALKPHOS 79 74  --   AST 30 48*  --   ALT 16 23  --   BILITOT 0.6 0.7  --   TRIG 185*  --   --   PREALBUMIN <5*  --   --    ASSESSMENT                                                                                                           Glucose (goal CBG 100-150) - No hx DM, CBGs controlled except for one reading of 171; no low CBGs  4 units SSI yesterday  Decadron 6 mg/d for COVID  Electrolytes - Na now WNL, CorrCa 8.9  Taking PO potassium 20 mEq daily  Renal - wnl, stable; UOP appears incompletely charted  LFTs -  wnl  TGs - 185 (11/25)   Prealbumin - < 5 (11/24), (11/25)  Current nutrition - Advanced to CLD 11/25, looks to be tolerating ~1 pro-stat daily (provides 15 mg protein); refusing all Ensure Max  MIVF - NS  at 40 ml/hr   PLAN                                                                                                                         At 1800  today:  Increase custom TPN at 85 ml/hr, target rate   Continue  PO multivitamin/trace elements  Electrolytes: maintain electrolytes in TPN as per previous day (11/26 increase Na, Ca, Phos, others unchanged); Cl:Ac = 1:1;  OK to continue PO Kdur for now  Decrease NS IVF to [facility] at 1800  Continue moderate q6h SSI  TPN lab panels on Mondays & Thursdays  Bmet, magnesium,  Phos tomorrow     Adalberto Cole, PharmD, BCPS 07/20/2019 11:40 AM

## 2019-07-21 ENCOUNTER — Other Ambulatory Visit: Payer: Self-pay

## 2019-07-21 DIAGNOSIS — A419 Sepsis, unspecified organism: Secondary | ICD-10-CM | POA: Clinically undetermined

## 2019-07-21 DIAGNOSIS — U071 COVID-19: Secondary | ICD-10-CM | POA: Diagnosis not present

## 2019-07-21 DIAGNOSIS — K5720 Diverticulitis of large intestine with perforation and abscess without bleeding: Secondary | ICD-10-CM

## 2019-07-21 DIAGNOSIS — R001 Bradycardia, unspecified: Secondary | ICD-10-CM | POA: Diagnosis not present

## 2019-07-21 LAB — CBC WITH DIFFERENTIAL/PLATELET
Abs Immature Granulocytes: 0.04 10*3/uL (ref 0.00–0.07)
Basophils Absolute: 0 10*3/uL (ref 0.0–0.1)
Basophils Relative: 0 %
Eosinophils Absolute: 0 10*3/uL (ref 0.0–0.5)
Eosinophils Relative: 0 %
HCT: 35.2 % — ABNORMAL LOW (ref 36.0–46.0)
Hemoglobin: 10.9 g/dL — ABNORMAL LOW (ref 12.0–15.0)
Immature Granulocytes: 1 %
Lymphocytes Relative: 11 %
Lymphs Abs: 0.9 10*3/uL (ref 0.7–4.0)
MCH: 27.1 pg (ref 26.0–34.0)
MCHC: 31 g/dL (ref 30.0–36.0)
MCV: 87.6 fL (ref 80.0–100.0)
Monocytes Absolute: 0.3 10*3/uL (ref 0.1–1.0)
Monocytes Relative: 4 %
Neutro Abs: 6.4 10*3/uL (ref 1.7–7.7)
Neutrophils Relative %: 84 %
Platelets: 593 10*3/uL — ABNORMAL HIGH (ref 150–400)
RBC: 4.02 MIL/uL (ref 3.87–5.11)
RDW: 16.3 % — ABNORMAL HIGH (ref 11.5–15.5)
WBC: 7.6 10*3/uL (ref 4.0–10.5)
nRBC: 0 % (ref 0.0–0.2)

## 2019-07-21 LAB — GLUCOSE, CAPILLARY
Glucose-Capillary: 170 mg/dL — ABNORMAL HIGH (ref 70–99)
Glucose-Capillary: 155 mg/dL — ABNORMAL HIGH (ref 70–99)
Glucose-Capillary: 114 mg/dL — ABNORMAL HIGH (ref 70–99)
Glucose-Capillary: 123 mg/dL — ABNORMAL HIGH (ref 70–99)
Glucose-Capillary: 176 mg/dL — ABNORMAL HIGH (ref 70–99)
Glucose-Capillary: 162 mg/dL — ABNORMAL HIGH (ref 70–99)

## 2019-07-21 LAB — BASIC METABOLIC PANEL
Anion gap: 9 (ref 5–15)
BUN: 15 mg/dL (ref 6–20)
CO2: 24 mmol/L (ref 22–32)
Calcium: 8.7 mg/dL — ABNORMAL LOW (ref 8.9–10.3)
Chloride: 104 mmol/L (ref 98–111)
Creatinine, Ser: 0.63 mg/dL (ref 0.44–1.00)
GFR calc Af Amer: >60 mL/min (ref >60)
GFR calc non Af Amer: >60 mL/min (ref >60)
Glucose, Bld: 169 mg/dL — ABNORMAL HIGH (ref 70–99)
Potassium: 4.3 mmol/L (ref 3.5–5.1)
Sodium: 137 mmol/L (ref 135–145)

## 2019-07-21 LAB — C-REACTIVE PROTEIN: CRP: 8.1 mg/dL — ABNORMAL HIGH (ref <1.0)

## 2019-07-21 LAB — MAGNESIUM: Magnesium: 2.1 mg/dL (ref 1.7–2.4)

## 2019-07-21 LAB — FIBRINOGEN: Fibrinogen: 537 mg/dL — ABNORMAL HIGH (ref 210–475)

## 2019-07-21 LAB — D-DIMER, QUANTITATIVE: D-Dimer, Quant: 5.1 ug/mL-FEU — ABNORMAL HIGH (ref 0.00–0.50)

## 2019-07-21 LAB — FERRITIN: Ferritin: 346 ng/mL — ABNORMAL HIGH (ref 11–307)

## 2019-07-21 LAB — PHOSPHORUS: Phosphorus: 3.9 mg/dL (ref 2.5–4.6)

## 2019-07-21 MED ORDER — TRAVASOL 10 % IV SOLN
INTRAVENOUS | Status: AC
  Administered 2019-07-21: 18:00:00 via INTRAVENOUS
  Filled 2019-07-21: qty 1122

## 2019-07-21 NOTE — Progress Notes (Signed)
Patient ID: Holly Moon, female   DOB: 06/15/1971, 48 y.o.   MRN: [id number]     Subjective: Only complains of pain when she moves  Objective: Vital signs in last 24 hours: Temp:  [97.1 F (36.2 C)-99.3 F (37.4 C)] 98.6 F (37 C) (11/28 0408) Pulse Rate:  [44-55] 44 (11/28 0408) Resp:  [16-20] 18 (11/28 0408) BP: (161-215)/(70-95) 161/85 (11/28 0408) SpO2:  [93 %-98 %] 93 % (11/28 0408) FiO2 (%):  [21 %] 21 % (11/28 0408) Last BM Date: 07/20/19  Intake/Output from previous day: 11/27 0701 - 11/28 0700 In: 3635.2 [P.O.:360; I.V.:2843.2; IV Piggyback:417] Out: 5185 [Urine:5000; Drains:185] Intake/Output this shift: No intake/output data recorded.  General appearance: alert and cooperative Resp: clear to auscultation bilaterally and productive cough Cardio: regular rate and rhythm GI: soft, mild tenderness LLQ  Lab Results:  Recent Labs    07/20/19 0510 07/21/19 0437  WBC 6.3 7.6  HGB 11.0* 10.9*  HCT 33.9* 35.2*  PLT 486* 593*   BMET Recent Labs    07/20/19 0510 07/21/19 0437  NA 136 137  K 4.3 4.3  CL 105 104  CO2 23 24  GLUCOSE 146* 169*  BUN 16 15  CREATININE 0.57 0.63  CALCIUM 8.2* 8.7*   PT/INR No results for input(s): LABPROT, INR in the last 72 hours. ABG No results for input(s): PHART, HCO3 in the last 72 hours.  Invalid input(s): PCO2, PO2  Studies/Results: No results found.  Anti-infectives: Anti-infectives (From admission, onward)   Start     Dose/Rate Route Frequency Ordered Stop   07/19/19 1000  remdesivir 100 mg in sodium chloride 0.9 % 250 mL IVPB     100 mg 500 mL/hr over 30 Minutes Intravenous Daily 07/17/19 1822 07/22/19 0959   07/18/19 1600  remdesivir 100 mg in sodium chloride 0.9 % 250 mL IVPB     100 mg 500 mL/hr over 30 Minutes Intravenous  Once 07/17/19 1822 07/18/19 1615   07/18/19 0600  vancomycin (VANCOCIN) 1,250 mg in sodium chloride 0.9 % 250 mL IVPB  Status:  Discontinued     1,250 mg 166.7 mL/hr over 90  Minutes Intravenous Every 12 hours 07/17/19 1055 07/19/19 1319   07/17/19 1830  remdesivir 200 mg in sodium chloride 0.9 % 250 mL IVPB     200 mg 500 mL/hr over 30 Minutes Intravenous Once 07/17/19 1822 07/17/19 2314   07/17/19 1100  vancomycin (VANCOCIN) 2,500 mg in sodium chloride 0.9 % 500 mL IVPB     2,500 mg 250 mL/hr over 120 Minutes Intravenous  Once 07/17/19 1004 07/17/19 2037   07/08/19 0600  piperacillin-tazobactam (ZOSYN) IVPB 3.375 g     3.375 g 12.5 mL/hr over 240 Minutes Intravenous Every 8 hours 07/08/19 0129     07/07/19 2315  piperacillin-tazobactam (ZOSYN) IVPB 3.375 g     3.375 g 100 mL/hr over 30 Minutes Intravenous  Once 07/07/19 2314 07/07/19 2356      Assessment/Plan: s/p  Advance diet  Continue abx and drains covid per medicine  LOS: 13 days    Autumn Messing III 07/21/2019

## 2019-07-21 NOTE — Progress Notes (Addendum)
PROGRESS NOTE                                                                                                                                                                                                             Patient Demographics:    Holly Moon, is a 48 y.o. female, DOB - 09-20-1970, ZOX:[id number]  Admit date - 07/07/2019   Admitting Physician Md Montez Morita, MD  Outpatient Primary MD for the patient is [name], [place]  LOS - 13    Chief Complaint  Patient presents with   Abdominal Pain   Near Syncope   Fever   Diarrhea       Brief Narrative 48 year old morbidly obese female with essential hypertension, fibromyalgia, history of PE off anticoagulation, alcohol and tobacco use who is admitted to surgical service with complicated acute sigmoid diverticulitis with developing fluid collection and abscess.  Abdominal CT from 11/18 showed diffuse colon diverticulosis with worsening inflammation around sigmoid colon and increased pneumoperitoneum with perihepatic ascites.  Repeat CT on 11/23 with possible abscesses.  Patient on empiric IV Zosyn and IR consulted for image guided drainage.  Hospitalist consulted for intermittent fever with new bilateral upper lobe infiltrate with effusion, increasing leukocytosis.  On 11/23 rapid response was called due to fever of 103F with tachycardia and tachypnea.  CT angiogram of the chest was negative for PE but showed extensive patchy bilateral pulmonary infiltrate in the upper lobes with small bilateral pleural effusion (L >R) reportedly patient had history of Covid in the family about 1 week prior to hospitalization.  Covid PCR test were checked on admission on 11/14.  Given her acute respiratory symptoms COVID-19 was tested again and came back positive.  Along with that she was found to have elevated D-dimer of 9.1, CRP of 30, fibrinogen of 700 and normal ferritin.  Patient  placed on airborne and contact precautions and started on empiric Decadron and remdesivir.    Subjective:   Reports off-and-on abdominal pain.  Noted to be bradycardic in the high 30s to 40s this morning.  Denies any symptoms.  Assessment  & Plan :   Principal problem Severe sepsis with acute hypoxic respiratory failure (HCC) Secondary to new COVID-19 infection diagnosed during hospital stay.  Sepsis currently resolved. Inflammatory markers continue to improve.  Continue IV  remdesivir (completes today).  Completed 10  days of Decadron today. Sats stable on room air.   Active symptoms Perforated sigmoid diverticulitis with abscess On 11/25, underwent CT-guided 12 French drainage catheter placement in the left lower abdomen/pelvis adjacent to the suspected colonic perforation with about 45 cc feculent material was aspirated and sent for microscopy.  Cultures growing E. coli which is pansensitive except for quinolones.  On empiric IV Zosyn. Still having significant output from the drain (185 cc).  IR recommends to continue the drain until output is less than 10 cc. - on clear liquid supplemented with TPN.  Having regular bowel movements.  Sinus bradycardia on 11/28 Heart rate dropping into 39-50.  EKG with sinus bradycardia in the low 50s.  I am going to hold  her Robaxin and Zanaflex since both of them can cause bradycardia..  Essential hypertension Continue ACE inhibitor.  History of fibromyalgia Continue Topamax, hold Zanaflex due to bradycardia.  Morbid obesity  History of tobacco and alcohol use No signs of withdrawal.  Patient reported quitting smoking 10 days prior to admission.   Code Status : Full code  Family Communication  : Husband updated on the phone while in the hospital.  Disposition Plan  : Pending hospital course  Barriers For Discharge : Active symptoms  Consults  : CCS, IR  Procedures  : CT abdomen pelvis, CT angiogram of the chest, IR drainage of  sigmoid abscess with perforation  DVT Prophylaxis  :  Lovenox  Lab Results  Component Value Date   PLT 593 (H) 07/21/2019    Antibiotics  :    Anti-infectives (From admission, onward)   Start     Dose/Rate Route Frequency Ordered Stop   07/19/19 1000  remdesivir 100 mg in sodium chloride 0.9 % 250 mL IVPB     100 mg 500 mL/hr over 30 Minutes Intravenous Daily 07/17/19 1822 07/21/19 1155   07/18/19 1600  remdesivir 100 mg in sodium chloride 0.9 % 250 mL IVPB     100 mg 500 mL/hr over 30 Minutes Intravenous  Once 07/17/19 1822 07/18/19 1615   07/18/19 0600  vancomycin (VANCOCIN) 1,250 mg in sodium chloride 0.9 % 250 mL IVPB  Status:  Discontinued     1,250 mg 166.7 mL/hr over 90 Minutes Intravenous Every 12 hours 07/17/19 1055 07/19/19 1319   07/17/19 1830  remdesivir 200 mg in sodium chloride 0.9 % 250 mL IVPB     200 mg 500 mL/hr over 30 Minutes Intravenous Once 07/17/19 1822 07/17/19 2314   07/17/19 1100  vancomycin (VANCOCIN) 2,500 mg in sodium chloride 0.9 % 500 mL IVPB     2,500 mg 250 mL/hr over 120 Minutes Intravenous  Once 07/17/19 1004 07/17/19 2037   07/08/19 0600  piperacillin-tazobactam (ZOSYN) IVPB 3.375 g     3.375 g 12.5 mL/hr over 240 Minutes Intravenous Every 8 hours 07/08/19 0129     07/07/19 2315  piperacillin-tazobactam (ZOSYN) IVPB 3.375 g     3.375 g 100 mL/hr over 30 Minutes Intravenous  Once 07/07/19 2314 07/07/19 2356        Objective:   Vitals:   07/20/19 1329 07/20/19 2104 07/20/19 2233 07/21/19 0408  BP: (!) 167/95 (!) 215/70 (!) 169/91 (!) 161/85  Pulse: (!) 55 (!) 55 (!) 49 (!) 44  Resp: 16 20  18   Temp: (!) 97.1 F (36.2 C) 99.3 F (37.4 C)  98.6 F (37 C)  TempSrc: Oral Oral  Oral  SpO2: 95% 98%  93%  Weight:  Height:        Wt Readings from Last 3 Encounters:  07/07/19 133 kg  02/17/18 (!) 155.1 kg  11/07/16 127 kg     Intake/Output Summary (Last 24 hours) at 07/21/2019 1156 Last data filed at 07/21/2019 1100 Gross per  24 hour  Intake 2018.87 ml  Output 6185 ml  Net -4166.13 ml   Physical exam Not in distress HEENT: Moist mucosa, supple neck Chest: Clear bilaterally CVs: Normal S1-S2, no murmur GI: Soft, nondistended, minimal tenderness, abdominal drain with purulent output Musculoskeletal: Warm, no edema          Data Review:    CBC Recent Labs  Lab 07/17/19 0615 07/18/19 0449 07/19/19 0416 07/20/19 0510 07/21/19 0437  WBC 16.7* 12.2* 8.1 6.3 7.6  HGB 11.0* 10.6* 10.4* 11.0* 10.9*  HCT 35.1* 32.8* 33.4* 33.9* 35.2*  PLT 366 372 457* 486* 593*  MCV 88.4 86.1 86.5 87.8 87.6  MCH 27.7 27.8 26.9 28.5 27.1  MCHC 31.3 32.3 31.1 32.4 31.0  RDW 15.9* 15.6* 15.9* 16.7* 16.3*  LYMPHSABS  --  0.7 0.6* 0.9 0.9  MONOABS  --  0.3 0.3 0.3 0.3  EOSABS  --  0.0 0.0 0.0 0.0  BASOSABS  --  0.0 0.0 0.0 0.0    Chemistries  Recent Labs  Lab 07/16/19 1433 07/17/19 1034 07/18/19 0449 07/19/19 0416 07/20/19 0510 07/21/19 0437  NA 135 135 136 133* 136 137  K 3.9 3.6 3.8 4.3 4.3 4.3  CL 105 104 105 105 105 104  CO2 20* 22 24 22 23 24   GLUCOSE 93 83 151* 152* 146* 169*  BUN 7 6 14 16 16 15   CREATININE 0.69 0.72 0.58 0.52 0.57 0.63  CALCIUM 8.0* 7.7* 7.6* 7.3* 8.2* 8.7*  MG  --  2.1 2.2 2.1  --  2.1  AST 22 23 30  48*  --   --   ALT 15 14 16 23   --   --   ALKPHOS 94 87 79 74  --   --   BILITOT 0.4 0.7 0.6 0.7  --   --    ------------------------------------------------------------------------------------------------------------------ No results for input(s): CHOL, HDL, LDLCALC, TRIG, CHOLHDL, LDLDIRECT in the last 72 hours.  No results found for: HGBA1C ------------------------------------------------------------------------------------------------------------------ No results for input(s): TSH, T4TOTAL, T3FREE, THYROIDAB in the last 72 hours.  Invalid input(s):  FREET3 ------------------------------------------------------------------------------------------------------------------ Recent Labs    07/20/19 0510 07/21/19 0437  FERRITIN 330* 346*    Coagulation profile Recent Labs  Lab 07/17/19 1601 07/18/19 0449  INR 1.5* 1.5*    Recent Labs    07/20/19 0510 07/21/19 0437  DDIMER 5.54* 5.10*    Cardiac Enzymes No results for input(s): CKMB, TROPONINI, MYOGLOBIN in the last 168 hours.  Invalid input(s): CK ------------------------------------------------------------------------------------------------------------------ No results found for: BNP  Inpatient Medications  Scheduled Meds:  acetaminophen  1,000 mg Oral Q6H   ALPRAZolam  0.5 mg Oral BID   benazepril  20 mg Oral Daily   busPIRone  30 mg Oral BID   Chlorhexidine Gluconate Cloth  6 each Topical Daily   desvenlafaxine  50 mg Oral Daily   enoxaparin (LOVENOX) injection  60 mg Subcutaneous Q12H   feeding supplement (PRO-STAT SUGAR FREE 64)  30 mL Oral BID   gabapentin  300 mg Oral TID   hydrALAZINE  5 mg Intravenous Once   insulin aspart  0-15 Units Subcutaneous Q6H   methocarbamol  500 mg Oral TID   multivitamin with minerals  1 tablet Oral Daily  oxyCODONE  5 mg Oral Q6H   pneumococcal 23 valent vaccine  0.5 mL Intramuscular Tomorrow-1000   potassium chloride  20 mEq Oral BID   Ensure Max Protein  11 oz Oral BID   sodium chloride flush  5 mL Intracatheter Q8H   tiZANidine  4 mg Oral QHS   topiramate  100 mg Oral Daily   Continuous Infusions:  sodium chloride Stopped (07/20/19 2257)   piperacillin-tazobactam (ZOSYN)  IV 3.375 g (07/21/19 0542)   TPN ADULT (ION) 85 mL/hr at 07/21/19 0658   TPN ADULT (ION)     PRN Meds:.alum & mag hydroxide-simeth, diphenhydrAMINE **OR** diphenhydrAMINE, HYDROmorphone (DILAUDID) injection, lip balm, ondansetron **OR** ondansetron (ZOFRAN) IV, simethicone, sodium chloride flush  Micro Results Recent  Results (from the past 240 hour(s))  SARS CORONAVIRUS 2 (TAT 6-24 HRS) Nasopharyngeal Nasopharyngeal Swab     Status: Abnormal   Collection Time: 07/17/19  8:14 AM   Specimen: Nasopharyngeal Swab  Result Value Ref Range Status   SARS Coronavirus 2 POSITIVE (A) NEGATIVE Final    Comment: RESULT CALLED TO, READ BACK BY AND VERIFIED WITH: Cory Munch RN 15:15 07/17/19 ([email or web address]) (NOTE) SARS-CoV-2 target nucleic acids are DETECTED. The SARS-CoV-2 RNA is generally detectable in upper and lower respiratory specimens during the acute phase of infection. Positive results are indicative of the presence of SARS-CoV-2 RNA. Clinical correlation with patient history and other diagnostic information is  necessary to determine patient infection status. Positive results do not rule out bacterial infection or co-infection with other viruses.  The expected result is Negative. Fact Sheet for Patients: HairSlick.no Fact Sheet for Healthcare Providers: quierodirigir.com This test is not yet approved or cleared by the [place] FDA and  has been authorized for detection and/or diagnosis of SARS-CoV-2 by FDA under an Emergency Use Authorization (EUA). This EUA will remain  in effect (meaning this test can be used) for t he duration of the COVID-19 declaration under Section 564(b)(1) of the Act, 21 U.S.C. section 360bbb-3(b)(1), unless the authorization is terminated or revoked sooner. Performed at [facility], [address] [place], [place] [address]   Culture, blood (routine x 2)     Status: None (Preliminary result)   Collection Time: 07/17/19 10:34 AM   Specimen: BLOOD  Result Value Ref Range Status   Specimen Description   Final    BLOOD RIGHT ARM Performed at [facility], [address] [place], [place] [address]    Special Requests   Final    BOTTLES DRAWN AEROBIC ONLY Blood Culture adequate volume Performed  at [facility], [address], [place], [place] [address]    Culture   Final    NO GROWTH 3 DAYS Performed at [facility], [address], [place], [place] [address]    Report Status PENDING  Incomplete  Culture, blood (routine x 2)     Status: None (Preliminary result)   Collection Time: 07/17/19  4:00 PM   Specimen: BLOOD  Result Value Ref Range Status   Specimen Description   Final    BLOOD RIGHT ANTECUBITAL Performed at [facility], [address], [place], [place] [address]    Special Requests   Final    BOTTLES DRAWN AEROBIC ONLY Blood Culture adequate volume Performed at [facility], [address], [place], [place] [address]    Culture   Final    NO GROWTH 3 DAYS Performed at [facility], [address] [place], [place] [address]    Report Status PENDING  Incomplete  Aerobic/Anaerobic Culture (surgical/deep wound)     Status: None (Preliminary result)   Collection Time: 07/18/19 10:59 AM   Specimen: Abscess  Result Value Ref Range Status   Specimen Description   Final    ABSCESS ABDOMEN Performed at [facility], [address], [place], [place] [address]    Special Requests   Final    Normal Performed at [facility], [address], [place], [place] [address]    Gram Stain   Final    ABUNDANT WBC PRESENT,BOTH PMN AND MONONUCLEAR ABUNDANT GRAM POSITIVE COCCI ABUNDANT GRAM NEGATIVE RODS FEW GRAM VARIABLE ROD    Culture   Final    ABUNDANT ESCHERICHIA COLI HOLDING FOR POSSIBLE ANAEROBE Performed at [facility], [address], [place], [place] [address]    Report Status PENDING  Incomplete   Organism ID, Bacteria ESCHERICHIA COLI  Final      Susceptibility   Escherichia coli - MIC*    AMPICILLIN 8 SENSITIVE Sensitive     CEFAZOLIN <=4 SENSITIVE Sensitive     CEFEPIME <=1 SENSITIVE Sensitive     CEFTAZIDIME <=1 SENSITIVE Sensitive      CEFTRIAXONE <=1 SENSITIVE Sensitive     CIPROFLOXACIN >=4 RESISTANT Resistant     GENTAMICIN <=1 SENSITIVE Sensitive     IMIPENEM <=0.25 SENSITIVE Sensitive     TRIMETH/SULFA <=20 SENSITIVE Sensitive     AMPICILLIN/SULBACTAM 4 SENSITIVE Sensitive     PIP/TAZO <=4 SENSITIVE Sensitive     Extended ESBL NEGATIVE Sensitive     * ABUNDANT ESCHERICHIA COLI    Radiology Reports Ct Angio Chest Pe W Or Wo Contrast  Result Date: 07/16/2019 CLINICAL DATA:  Acute onset of shortness of breath today EXAM: CT ANGIOGRAPHY CHEST WITH CONTRAST TECHNIQUE: Multidetector CT imaging of the chest was performed using the standard protocol during bolus administration of intravenous contrast. Multiplanar CT image reconstructions and MIPs were obtained to evaluate the vascular anatomy. CONTRAST:  80mL OMNIPAQUE IOHEXOL 350 MG/ML SOLN COMPARISON:  Chest x-ray 07/07/2019 FINDINGS: Cardiovascular: Satisfactory opacification of the pulmonary arteries to the segmental level. No evidence of pulmonary embolism. Normal heart size. Tiny pericardial effusion. Mediastinum/Nodes: No enlarged mediastinal, hilar, or axillary lymph nodes. Thyroid gland, trachea, and esophagus demonstrate no significant findings. Lungs/Pleura: Patient has extensive patchy bilateral pulmonary infiltrates primarily in the upper lobes. Small bilateral pleural effusions, slightly greater on the left. Slight atelectasis in the right middle lobe and in the right lower lobe. Upper Abdomen: Free air under the right hemidiaphragm. See CT scan of the abdomen report for further information. Musculoskeletal: No chest wall abnormality. No acute or significant osseous findings. Review of the MIP images confirms the above findings. IMPRESSION: 1. No pulmonary emboli. 2. Extensive patchy bilateral pulmonary infiltrates primarily in the upper lobes. 3. Small bilateral pleural effusions, slightly greater on the left. 4. Free air under the right hemidiaphragm. See CT scan of the  abdomen report for further information. Electronically Signed   By: Francene Boyers M.D.   On: 07/16/2019 21:25   Ct Abdomen Pelvis W Contrast  Result Date: 07/16/2019 CLINICAL DATA:  Abdominal pain and recurrent fever EXAM: CT ABDOMEN AND PELVIS WITH CONTRAST TECHNIQUE: Multidetector CT imaging of the abdomen and pelvis was performed using the standard protocol following bolus administration of intravenous contrast. CONTRAST:  OMNIPAQUE IOHEXOL 300 MG/ML  SOLN COMPARISON:  07/11/2019 FINDINGS: Lower chest: Bilateral small  effusions are noted which have increased somewhat in the interval from the prior exam. Patchy infiltrate is again identified in the lung bases but has increased somewhat from the prior exams particularly in the upper lobes bilaterally. Hepatobiliary: The liver is within normal limits. The gallbladder is well distended with multiple gallstones within. Pancreas: Unremarkable. No pancreatic ductal dilatation or surrounding inflammatory changes. Spleen: Normal in size without focal abnormality. Adrenals/Urinary Tract: Adrenal glands are within normal limits. Kidneys demonstrate a normal enhancement pattern bilaterally. Delayed images demonstrate normal excretion without obstructive change. The bladder is partially distended. Stomach/Bowel: There again noted changes consistent with diverticular disease throughout the colon. Adjacent to the sigmoid colon however in an area of previous inflammation there is now an air-fluid collection that measures at least 7.8 x 4.3 cm in dimension within air-fluid level within consistent with a pericolonic abscess. Additionally there are findings along the ascending colon suggestive of rupture of the ascending colon with increase in intraperitoneal air. A large air fluid collection is identified in the right mid abdomen which measures at least 13 by 7 cm in dimension best seen on image number 65 of series 2. This lies adjacent to the inflamed ascending colon  and is also consistent with a pericolonic abscess. Additionally communicating with this large collection is a third air-fluid collection which lies along the lateral aspect of the right lobe of the liver causing mass effect upon the adjacent liver measuring 15 x 6.2 cm in greatest dimension. The appendix is within normal limits. The small bowel is also unremarkable. The stomach is within normal limits. Vascular/Lymphatic: No significant vascular findings are present. No enlarged abdominal or pelvic lymph nodes. Reproductive: Uterus and bilateral adnexa are unremarkable. Other: No abdominal wall hernia or abnormality. No abdominopelvic ascites. Musculoskeletal: Degenerative changes of the lumbar spine are seen. IMPRESSION: There are changes consistent with progressive inflammatory change involving the colon primarily within the ascending colon in an area where prior small pericolonic air collections were noted. This results in a large somewhat bilobed air-fluid collection which extends along the lateral aspect of the liver as well as anterior to the ascending colon. It is possible the collection anterior to the ascending colon is dilatation of the ascending colon although felt to be less likely given the overall appearance. A third collection is noted along the course of the sigmoid colon consistent with increasing pericolonic abscess as described. Bilateral pleural effusions which have increased in the interval from the prior exam. Additionally increasing patchy infiltrate is noted throughout both lungs primarily within the bases although now seen within the upper lobes bilaterally. These results will be called to the ordering clinician or representative by the Radiologist Assistant, and communication documented in the PACS or zVision Dashboard. Electronically Signed   By: Alcide Clever M.D.   On: 07/16/2019 19:49   Ct Abdomen Pelvis W Contrast  Addendum Date: 07/11/2019   ADDENDUM REPORT: 07/11/2019 20:01  ADDENDUM: Critical Value/emergent results were called by telephone at the time of interpretation on 07/11/2019 at 8:01 pm to providerDR. Romie Levee , who verbally acknowledged these results. Electronically Signed   By: Charlett Nose M.D.   On: 07/11/2019 20:01   Result Date: 07/11/2019 CLINICAL DATA:  Abdominal pain EXAM: CT ABDOMEN AND PELVIS WITH CONTRAST TECHNIQUE: Multidetector CT imaging of the abdomen and pelvis was performed using the standard protocol following bolus administration of intravenous contrast. CONTRAST:  OMNIPAQUE IOHEXOL 300 MG/ML SOLN, 30mL OMNIPAQUE IOHEXOL 300 MG/ML SOLN COMPARISON:  07/07/2019 FINDINGS: Lower  chest: Trace bilateral pleural effusions. Bibasilar atelectasis. Heart is normal size. Hepatobiliary: Gallstones within the gallbladder. No focal hepatic abnormality. Pancreas: No focal abnormality or ductal dilatation. Spleen: No focal abnormality.  Normal size. Adrenals/Urinary Tract: No adrenal abnormality. No focal renal abnormality. No stones or hydronephrosis. Urinary bladder is unremarkable. Stomach/Bowel: Extensive colonic diverticulosis. Stranding/inflammation around the sigmoid colon has progressed since prior study. There is also abnormal stranding along the anterior aspect of the ascending colon which is new since prior study. Increasing pneumoperitoneum with locules of gas throughout the peritoneum. Is difficult to determine if the source is the sigmoid colon or the ascending colon. Appendix is normal. Stomach and small bowel decompressed. Vascular/Lymphatic: No evidence of aneurysm or adenopathy. Reproductive: Uterus and adnexa unremarkable.  No mass. Other: Small amount of perihepatic ascites. Musculoskeletal: No acute bony abnormality. IMPRESSION: Diffuse colonic diverticulosis. Worsening inflammation around the sigmoid colon with new inflammation along the anterior wall of the ascending colon. There is increasing pneumoperitoneum throughout the abdomen and  pelvis along with new perihepatic ascites. Findings compatible with perforation. It is difficult to determine exact source of free air. The majority of the free intraperitoneal air is centered anterior to the ascending colon and liver suggesting the possibility that the ascending colon is the source. Cholelithiasis. Trace bilateral effusions.  Bibasilar atelectasis. Electronically Signed: By: Rolm Baptise M.D. On: 07/11/2019 19:32   Ct Abdomen Pelvis W Contrast  Result Date: 07/07/2019 CLINICAL DATA:  Abdominal pain diarrhea EXAM: CT ABDOMEN AND PELVIS WITH CONTRAST TECHNIQUE: Multidetector CT imaging of the abdomen and pelvis was performed using the standard protocol following bolus administration of intravenous contrast. CONTRAST:  17mL OMNIPAQUE IOHEXOL 300 MG/ML  SOLN COMPARISON:  None. FINDINGS: Lower chest: Lung bases demonstrate no acute consolidation or effusion. The heart size is within normal limits. Hepatobiliary: No focal hepatic abnormality or biliary dilatation. Increased intraluminal density within the gallbladder. Pancreas: Unremarkable. No pancreatic ductal dilatation or surrounding inflammatory changes. Spleen: Normal in size without focal abnormality. Adrenals/Urinary Tract: Adrenal glands are unremarkable. Kidneys are normal, without renal calculi, focal lesion, or hydronephrosis. Bladder is unremarkable. Stomach/Bowel: The stomach is nonenlarged. No dilated small bowel. Nondilated appendix. Hyperdense focus at the cecum near the origin of the appendix, possible diverticulum versus small appendicoliths but no evidence for appendiceal inflammatory change. Fluid within the colon. Sigmoid colon diverticular disease with wall thickening and inflammatory change consistent with acute diverticulitis. Multiple foci of extraluminal gas at the inflamed sigmoid colon. Vascular/Lymphatic: Nonaneurysmal aorta. No significantly enlarged lymph nodes. Reproductive: Uterus and bilateral adnexa are  unremarkable. Other: Small foci of extraluminal gas within the right lower quadrant adjacent to the inflamed sigmoid colon. Tiny foci of gas within the right upper quadrant and overlying the dome of the liver. Musculoskeletal: No acute or suspicious osseous abnormality. There are degenerative changes. IMPRESSION: 1. Focal inflammatory changes and wall thickening involving the distal sigmoid colon consistent with acute diverticulitis. Multiple small foci of gas adjacent to the inflamed sigmoid colon with small foci of free air adjacent to the liver, consistent with perforation. Negative for organized abscess at this time. 2. Increased intraluminal density within the gallbladder, possible stones or sludge. Critical Value/emergent results were called by telephone at the time of interpretation on 07/07/2019 at 11:12 pm to Harper , who verbally acknowledged these results. Electronically Signed   By: Donavan Foil M.D.   On: 07/07/2019 23:12   Dg Chest Portable 1 View  Result Date: 07/07/2019 CLINICAL DATA:  Fever, recent COVID exposure. EXAM:  PORTABLE CHEST 1 VIEW COMPARISON:  04/24/2013 FINDINGS: The heart size and mediastinal contours are within normal limits. Both lungs are clear. The visualized skeletal structures are unremarkable. IMPRESSION: No active disease. Electronically Signed   By: Charlett Nose M.D.   On: 07/07/2019 19:45   Ct Image Guided Drainage By Percutaneous Catheter  Result Date: 07/18/2019 INDICATION: Colonic perforation of indeterminate source now with multiple intra-abdominal mixed fluid though predominantly air containing collections within the abdomen. Active COVID-19 infection. Please perform CT-guided percutaneous drainage catheter(s) placement(s) for infection source control purposes. EXAM: CT IMAGE GUIDED DRAINAGE BY PERCUTANEOUS CATHETER x2 COMPARISON:  CT abdomen pelvis - 07/11/2019; 07/07/2019 MEDICATIONS: The patient is currently admitted to the hospital and receiving  intravenous antibiotics. The antibiotics were administered within an appropriate time frame prior to the initiation of the procedure. ANESTHESIA/SEDATION: Moderate (conscious) sedation was employed during this procedure. A total of Versed 6 mg and Fentanyl 300 mcg was administered intravenously. Moderate Sedation Time: 50 minutes. The patient's level of consciousness and vital signs were monitored continuously by radiology nursing throughout the procedure under my direct supervision. CONTRAST:  None COMPLICATIONS: None immediate. PROCEDURE: Informed written consent was obtained from the patient after a discussion of the risks, benefits and alternatives to treatment. The patient was placed supine on the CT gantry and a pre procedural CT was performed re-demonstrating the known abscesses/air and fluid collections within the with dominant collection within the right upper abdominal quadrant measuring approximately 14.8 x 6.3 cm (image 50, series 2) dominant predominantly air collection within right lower quadrant measuring approximately 15.5 x 8.3 cm (image 72, series 2) and dominant mixed and air and fluid containing collection within the midline of the lower abdomen/pelvis measuring approximately 9.7 x 5.1 cm (image 97, series 2). The procedure was planned. A timeout was performed prior to the initiation of the procedure. The right upper and mid abdomen as well as the left lower abdomen was was prepped and draped in the usual sterile fashion. The overlying soft tissues were anesthetized with 1% lidocaine with epinephrine. 18 gauge trocar needles were advanced into each fluid collection in short Amplatz wires were coiled within each separate collection. Next, sequentially, each track was serially dilated allowing placement of 12 French percutaneous drainage catheters under intermittent CT fluoroscopic guidance. Completion CT imaging was obtained and drainage catheters were retracted into the dominant component of each  air and fluid containing collection. Postprocedural CT imaging was obtained. A total of approximately 45 cc of feculent appearing fluid was aspirated from all 3 drains, with the largest amount aspirated from the left lower quadrant percutaneous drainage catheter. The drainage catheter within the left lower abdominal quadrant was connected to a gravity bag while the other 2 percutaneous drainage catheters were connected to JP bulbs. All drainage catheters were secured at the skin entrance site within interrupted sutures and Stat Lock devices. Dressings were applied. The patient tolerated the procedure well without immediate postprocedural complication. IMPRESSION: Technically successful CT-guided placement of 3 separate percutaneous drainage catheters into dominant mixed fluid though predominantly air containing collections within the right upper and mid abdomen as well as the left lower abdominal quadrant. Electronically Signed   By: Simonne Come M.D.   On: 07/18/2019 13:05   Ct Image Guided Drainage By Percutaneous Catheter  Result Date: 07/18/2019 INDICATION: Colonic perforation of indeterminate source now with multiple intra-abdominal mixed fluid though predominantly air containing collections within the abdomen. Active COVID-19 infection. Please perform CT-guided percutaneous drainage catheter(s) placement(s) for infection source  control purposes. EXAM: CT IMAGE GUIDED DRAINAGE BY PERCUTANEOUS CATHETER x2 COMPARISON:  CT abdomen pelvis - 07/11/2019; 07/07/2019 MEDICATIONS: The patient is currently admitted to the hospital and receiving intravenous antibiotics. The antibiotics were administered within an appropriate time frame prior to the initiation of the procedure. ANESTHESIA/SEDATION: Moderate (conscious) sedation was employed during this procedure. A total of Versed 6 mg and Fentanyl 300 mcg was administered intravenously. Moderate Sedation Time: 50 minutes. The patient's level of consciousness and vital  signs were monitored continuously by radiology nursing throughout the procedure under my direct supervision. CONTRAST:  None COMPLICATIONS: None immediate. PROCEDURE: Informed written consent was obtained from the patient after a discussion of the risks, benefits and alternatives to treatment. The patient was placed supine on the CT gantry and a pre procedural CT was performed re-demonstrating the known abscesses/air and fluid collections within the with dominant collection within the right upper abdominal quadrant measuring approximately 14.8 x 6.3 cm (image 50, series 2) dominant predominantly air collection within right lower quadrant measuring approximately 15.5 x 8.3 cm (image 72, series 2) and dominant mixed and air and fluid containing collection within the midline of the lower abdomen/pelvis measuring approximately 9.7 x 5.1 cm (image 97, series 2). The procedure was planned. A timeout was performed prior to the initiation of the procedure. The right upper and mid abdomen as well as the left lower abdomen was was prepped and draped in the usual sterile fashion. The overlying soft tissues were anesthetized with 1% lidocaine with epinephrine. 18 gauge trocar needles were advanced into each fluid collection in short Amplatz wires were coiled within each separate collection. Next, sequentially, each track was serially dilated allowing placement of 12 French percutaneous drainage catheters under intermittent CT fluoroscopic guidance. Completion CT imaging was obtained and drainage catheters were retracted into the dominant component of each air and fluid containing collection. Postprocedural CT imaging was obtained. A total of approximately 45 cc of feculent appearing fluid was aspirated from all 3 drains, with the largest amount aspirated from the left lower quadrant percutaneous drainage catheter. The drainage catheter within the left lower abdominal quadrant was connected to a gravity bag while the other 2  percutaneous drainage catheters were connected to JP bulbs. All drainage catheters were secured at the skin entrance site within interrupted sutures and Stat Lock devices. Dressings were applied. The patient tolerated the procedure well without immediate postprocedural complication. IMPRESSION: Technically successful CT-guided placement of 3 separate percutaneous drainage catheters into dominant mixed fluid though predominantly air containing collections within the right upper and mid abdomen as well as the left lower abdominal quadrant. Electronically Signed   By: Simonne Come M.D.   On: 07/18/2019 13:05   Ct Image Guided Drainage By Percutaneous Catheter  Result Date: 07/18/2019 INDICATION: Colonic perforation of indeterminate source now with multiple intra-abdominal mixed fluid though predominantly air containing collections within the abdomen. Active COVID-19 infection. Please perform CT-guided percutaneous drainage catheter(s) placement(s) for infection source control purposes. EXAM: CT IMAGE GUIDED DRAINAGE BY PERCUTANEOUS CATHETER x2 COMPARISON:  CT abdomen pelvis - 07/11/2019; 07/07/2019 MEDICATIONS: The patient is currently admitted to the hospital and receiving intravenous antibiotics. The antibiotics were administered within an appropriate time frame prior to the initiation of the procedure. ANESTHESIA/SEDATION: Moderate (conscious) sedation was employed during this procedure. A total of Versed 6 mg and Fentanyl 300 mcg was administered intravenously. Moderate Sedation Time: 50 minutes. The patient's level of consciousness and vital signs were monitored continuously by radiology nursing throughout the procedure  under my direct supervision. CONTRAST:  None COMPLICATIONS: None immediate. PROCEDURE: Informed written consent was obtained from the patient after a discussion of the risks, benefits and alternatives to treatment. The patient was placed supine on the CT gantry and a pre procedural CT was  performed re-demonstrating the known abscesses/air and fluid collections within the with dominant collection within the right upper abdominal quadrant measuring approximately 14.8 x 6.3 cm (image 50, series 2) dominant predominantly air collection within right lower quadrant measuring approximately 15.5 x 8.3 cm (image 72, series 2) and dominant mixed and air and fluid containing collection within the midline of the lower abdomen/pelvis measuring approximately 9.7 x 5.1 cm (image 97, series 2). The procedure was planned. A timeout was performed prior to the initiation of the procedure. The right upper and mid abdomen as well as the left lower abdomen was was prepped and draped in the usual sterile fashion. The overlying soft tissues were anesthetized with 1% lidocaine with epinephrine. 18 gauge trocar needles were advanced into each fluid collection in short Amplatz wires were coiled within each separate collection. Next, sequentially, each track was serially dilated allowing placement of 12 French percutaneous drainage catheters under intermittent CT fluoroscopic guidance. Completion CT imaging was obtained and drainage catheters were retracted into the dominant component of each air and fluid containing collection. Postprocedural CT imaging was obtained. A total of approximately 45 cc of feculent appearing fluid was aspirated from all 3 drains, with the largest amount aspirated from the left lower quadrant percutaneous drainage catheter. The drainage catheter within the left lower abdominal quadrant was connected to a gravity bag while the other 2 percutaneous drainage catheters were connected to JP bulbs. All drainage catheters were secured at the skin entrance site within interrupted sutures and Stat Lock devices. Dressings were applied. The patient tolerated the procedure well without immediate postprocedural complication. IMPRESSION: Technically successful CT-guided placement of 3 separate percutaneous  drainage catheters into dominant mixed fluid though predominantly air containing collections within the right upper and mid abdomen as well as the left lower abdominal quadrant. Electronically Signed   By: Simonne Come M.D.   On: 07/18/2019 13:05   [place] Ekg Site Rite  Result Date: 07/17/2019 If Site Rite image not attached, placement could not be confirmed due to current cardiac rhythm.   Time Spent in minutes 35   Boni Maclellan M.D on 07/21/2019 at 11:56 AM  Between 7am to 7pm - Pager - [phone]  After 7pm go to [email or web address] - password [facility]  [facility] -  Office  [phone]

## 2019-07-21 NOTE — Plan of Care (Signed)
  Problem: Clinical Measurements: Goal: Will remain free from infection Outcome: Progressing Goal: Diagnostic test results will improve Outcome: Progressing   Problem: Activity: Goal: Risk for activity intolerance will decrease Outcome: Progressing   Problem: Nutrition: Goal: Adequate nutrition will be maintained Outcome: Progressing   Problem: Coping: Goal: Level of anxiety will decrease Outcome: Progressing   Problem: Elimination: Goal: Will not experience complications related to bowel motility Outcome: Progressing   Problem: Pain Managment: Goal: General experience of comfort will improve Outcome: Progressing   

## 2019-07-21 NOTE — Progress Notes (Addendum)
[facility] NOTE   Pharmacy Consult for TPN Indication:  Intra-abd infection  HPI:  27 yoF admit 11/14 with abd pain, acute sigmoid diverticulitis with pneumoperitoneum, ascites  TPN Access: PICC TPN start date: 11/24 11/27 - clarified with surgeon to continue TPN.    Patient Measurements: Estimated body mass index is 48.79 kg/m as calculated from the following:   Height as of this encounter: 5\' 5"  (1.651 m).   Weight as of this encounter: 293 lb 3.4 oz (133 kg).   Recent Labs    07/19/19 0416 07/20/19 0510 07/21/19 0437  NA 133* 136 137  K 4.3 4.3 4.3  CL 105 105 104  CO2 22 23 24   GLUCOSE 152* 146* 169*  BUN 16 16 15   CREATININE 0.52 0.57 0.63  CALCIUM 7.3* 8.2* 8.7*  PHOS 2.6 3.0 3.9  MG 2.1  --  2.1  ALBUMIN 2.1*  --   --   ALKPHOS 74  --   --   AST 48*  --   --   ALT 23  --   --   BILITOT 0.7  --   --    ASSESSMENT                                                                                                           Glucose (goal CBG 100-150) - No hx DM, CBGs mostly controlled (range 114-182); no low CBGs  9 units SSI yesterday  Decadron 6 mg/d for COVID  Electrolytes - Na now WNL, CorrCa 8.9  Taking PO potassium 20 mEq daily  Renal - wnl, stable; UOP appears incompletely charted  LFTs -  wnl (11/26)  TGs - 185 (11/25)   Prealbumin - < 5 (11/24), (11/25)  Current nutrition - Advanced to soft diet 11/28, looks to be tolerating 1 of 2 pro-stat daily (provides 15 mg protein); refusing most Ensure Max  MIVF - NS  at 40 ml/hr  NUTRITIONAL GOALS         Estimated Nutritional Needs - Per RD recommendations on 11/24 Kcal:  1995-2260 kcal Protein:  100-113 grams Fluid:  >/= 2 L/day  TPN at goal rate of 85 ml/hr will yield: 112 gm protein, 2105 kCal per day   PLAN                                                                                                                         At 1800  today:  Continue custom TPN at 85 ml/hr  Continue  PO multivitamin/trace elements  Electrolytes: decrease Phos, no other changes; Cl:Ac = 1:1; OK to continue PO Kdur for now  MIVF at [facility]  Continue moderate SSI with q6h CBG checks  TPN lab panels on Mondays & Thursdays    Bernadene Person, PharmD, BCPS [phone] 07/21/2019, 11:03 AM

## 2019-07-22 DIAGNOSIS — R6520 Severe sepsis without septic shock: Secondary | ICD-10-CM

## 2019-07-22 DIAGNOSIS — A419 Sepsis, unspecified organism: Secondary | ICD-10-CM

## 2019-07-22 LAB — BASIC METABOLIC PANEL
Anion gap: 9 (ref 5–15)
BUN: 20 mg/dL (ref 6–20)
CO2: 26 mmol/L (ref 22–32)
Calcium: 9 mg/dL (ref 8.9–10.3)
Chloride: 102 mmol/L (ref 98–111)
Creatinine, Ser: 0.57 mg/dL (ref 0.44–1.00)
GFR calc Af Amer: >60 mL/min (ref >60)
GFR calc non Af Amer: >60 mL/min (ref >60)
Glucose, Bld: 135 mg/dL — ABNORMAL HIGH (ref 70–99)
Potassium: 4.2 mmol/L (ref 3.5–5.1)
Sodium: 137 mmol/L (ref 135–145)

## 2019-07-22 LAB — FIBRINOGEN: Fibrinogen: 494 mg/dL — ABNORMAL HIGH (ref 210–475)

## 2019-07-22 LAB — CULTURE, BLOOD (ROUTINE X 2)
Culture: NO GROWTH
Culture: NO GROWTH
Special Requests: ADEQUATE
Special Requests: ADEQUATE

## 2019-07-22 LAB — FERRITIN: Ferritin: 414 ng/mL — ABNORMAL HIGH (ref 11–307)

## 2019-07-22 LAB — CBC WITH DIFFERENTIAL/PLATELET
Abs Immature Granulocytes: 0.06 10*3/uL (ref 0.00–0.07)
Basophils Absolute: 0 10*3/uL (ref 0.0–0.1)
Basophils Relative: 0 %
Eosinophils Absolute: 0 10*3/uL (ref 0.0–0.5)
Eosinophils Relative: 0 %
HCT: 37 % (ref 36.0–46.0)
Hemoglobin: 11.5 g/dL — ABNORMAL LOW (ref 12.0–15.0)
Immature Granulocytes: 1 %
Lymphocytes Relative: 17 %
Lymphs Abs: 1.2 10*3/uL (ref 0.7–4.0)
MCH: 26.9 pg (ref 26.0–34.0)
MCHC: 31.1 g/dL (ref 30.0–36.0)
MCV: 86.4 fL (ref 80.0–100.0)
Monocytes Absolute: 0.4 10*3/uL (ref 0.1–1.0)
Monocytes Relative: 6 %
Neutro Abs: 5.4 10*3/uL (ref 1.7–7.7)
Neutrophils Relative %: 76 %
Platelets: 707 10*3/uL — ABNORMAL HIGH (ref 150–400)
RBC: 4.28 MIL/uL (ref 3.87–5.11)
RDW: 15.9 % — ABNORMAL HIGH (ref 11.5–15.5)
WBC: 7.1 10*3/uL (ref 4.0–10.5)
nRBC: 0.3 % — ABNORMAL HIGH (ref 0.0–0.2)

## 2019-07-22 LAB — GLUCOSE, CAPILLARY
Glucose-Capillary: 136 mg/dL — ABNORMAL HIGH (ref 70–99)
Glucose-Capillary: 115 mg/dL — ABNORMAL HIGH (ref 70–99)
Glucose-Capillary: 104 mg/dL — ABNORMAL HIGH (ref 70–99)
Glucose-Capillary: 116 mg/dL — ABNORMAL HIGH (ref 70–99)
Glucose-Capillary: 137 mg/dL — ABNORMAL HIGH (ref 70–99)

## 2019-07-22 LAB — C-REACTIVE PROTEIN: CRP: 6.9 mg/dL — ABNORMAL HIGH (ref <1.0)

## 2019-07-22 LAB — D-DIMER, QUANTITATIVE: D-Dimer, Quant: 4.2 ug/mL-FEU — ABNORMAL HIGH (ref 0.00–0.50)

## 2019-07-22 MED ORDER — TRAVASOL 10 % IV SOLN
INTRAVENOUS | Status: DC
  Administered 2019-07-22: 18:00:00 via INTRAVENOUS
  Filled 2019-07-22: qty 858

## 2019-07-22 MED ORDER — TRAVASOL 10 % IV SOLN
INTRAVENOUS | Status: DC
  Filled 2019-07-22: qty 1122

## 2019-07-22 NOTE — Progress Notes (Signed)
Patient ID: Holly Moon, female   DOB: 1971/02/06, 48 y.o.   MRN: [id number]     Subjective: Only complains of pain when she moves around  Objective: Vital signs in last 24 hours: Temp:  [96.9 F (36.1 C)-98.6 F (37 C)] 96.9 F (36.1 C) (11/29 0607) Pulse Rate:  [52-61] 52 (11/29 0607) Resp:  [20-21] 20 (11/29 0607) BP: (158-167)/(79-91) 167/89 (11/29 0607) SpO2:  [92 %-97 %] 96 % (11/29 0607) Last BM Date: 07/21/19  Intake/Output from previous day: 11/28 0701 - 11/29 0700 In: 1744.5 [P.O.:420; I.V.:978.7; IV Piggyback:220.8] Out: 5427 [Urine:3200; Drains:65] Intake/Output this shift: No intake/output data recorded.  General appearance: alert and cooperative Resp: clear to auscultation bilaterally Cardio: regular rate and rhythm GI: soft, mild tenderness LLQ  Lab Results:  Recent Labs    07/21/19 0437 07/22/19 0500  WBC 7.6 7.1  HGB 10.9* 11.5*  HCT 35.2* 37.0  PLT 593* 707*   BMET Recent Labs    07/21/19 0437 07/22/19 0500  NA 137 137  K 4.3 4.2  CL 104 102  CO2 24 26  GLUCOSE 169* 135*  BUN 15 20  CREATININE 0.63 0.57  CALCIUM 8.7* 9.0   PT/INR No results for input(s): LABPROT, INR in the last 72 hours. ABG No results for input(s): PHART, HCO3 in the last 72 hours.  Invalid input(s): PCO2, PO2  Studies/Results: No results found.  Anti-infectives: Anti-infectives (From admission, onward)   Start     Dose/Rate Route Frequency Ordered Stop   07/19/19 1000  remdesivir 100 mg in sodium chloride 0.9 % 250 mL IVPB     100 mg 500 mL/hr over 30 Minutes Intravenous Daily 07/17/19 1822 07/21/19 1155   07/18/19 1600  remdesivir 100 mg in sodium chloride 0.9 % 250 mL IVPB     100 mg 500 mL/hr over 30 Minutes Intravenous  Once 07/17/19 1822 07/18/19 1615   07/18/19 0600  vancomycin (VANCOCIN) 1,250 mg in sodium chloride 0.9 % 250 mL IVPB  Status:  Discontinued     1,250 mg 166.7 mL/hr over 90 Minutes Intravenous Every 12 hours 07/17/19 1055  07/19/19 1319   07/17/19 1830  remdesivir 200 mg in sodium chloride 0.9 % 250 mL IVPB     200 mg 500 mL/hr over 30 Minutes Intravenous Once 07/17/19 1822 07/17/19 2314   07/17/19 1100  vancomycin (VANCOCIN) 2,500 mg in sodium chloride 0.9 % 500 mL IVPB     2,500 mg 250 mL/hr over 120 Minutes Intravenous  Once 07/17/19 1004 07/17/19 2037   07/08/19 0600  piperacillin-tazobactam (ZOSYN) IVPB 3.375 g     3.375 g 12.5 mL/hr over 240 Minutes Intravenous Every 8 hours 07/08/19 0129     07/07/19 2315  piperacillin-tazobactam (ZOSYN) IVPB 3.375 g     3.375 g 100 mL/hr over 30 Minutes Intravenous  Once 07/07/19 2314 07/07/19 2356      Assessment/Plan: s/p  Advance diet. Allowing soft Continue drains and abx. May need drain study this week Wbc and temp normal Covid per medicine   LOS: 14 days    Holly Moon 07/22/2019

## 2019-07-22 NOTE — Progress Notes (Addendum)
PROGRESS NOTE    Holly Moon   AVW:[id number]  DOB: 07/09/1971  DOA: 07/07/2019 PCP: Porfirio OarJeffery, [place], PA   Brief Narrative:  Holly Moon 48 year old morbidly obese female with essential hypertension, fibromyalgia, history of PE off anticoagulation, alcohol and tobacco use who is admitted to surgical service with complicated acute sigmoid diverticulitis with developing fluid collection and abscess.  Abdominal CT from 11/18 showed diffuse colon diverticulosis with worsening inflammation around sigmoid colon and increased pneumoperitoneum with perihepatic ascites.  Repeat CT on 11/23 with possible abscesses.  Patient on empiric IV Zosyn and IR consulted for image guided drainage.  Hospitalist consulted for intermittent fever with new bilateral upper lobe infiltrate with effusion, increasing leukocytosis.  On 11/23 rapid response was called due to fever of 103F with tachycardia and tachypnea.  CT angiogram of the chest was negative for PE but showed extensive patchy bilateral pulmonary infiltrate in the upper lobes with small bilateral pleural effusion (L >R) reportedly patient had history of Covid in the family about 1 week prior to hospitalization.  Covid PCR test were checked on admission on 11/14.  Given her acute respiratory symptoms COVID-19 was tested again and came back positive.  Along with that she was found to have elevated D-dimer of 9.1, CRP of 30, fibrinogen of 700 and normal ferritin.  Patient placed on airborne and contact precautions and started on empiric Decadron and remdesivir.   Subjective: Tolerating solid food. Mild dry cough.     Assessment & Plan:   Principal Problem: Perforation of sigmoid colon due to diverticulitis -11/25-percutaneous drain placed -Continue IV Zosyn-cultures are growing E. coli  -She continues to have output from her drain and therefore it has not yet been removed -Diet has been advanced to soft food and I suspect we may be able  to start weaning the TPN  Active Problems:    COVID-19 virus infection -She has been treated with Decadron and remdesivir and is recuperating well from this-she has residual mild dry cough and dyspnea  Severe sepsis -Secondary to above-mentioned infections which are now coming under control  Sinus bradycardia She is asymptomatic from this  Essential hypertension -Continue Lotensin  Morbid obesity -Body mass index is 48.79 kg/m.  Tobacco abuse -Patient stated that she stopped smoking about 10 days prior to admission   Time spent in minutes: 35 DVT prophylaxis: Lovenox Code Status: Full code Family Communication: None Disposition Plan: Home when stable Consultants:   General surgery  IR Procedures:   Transcutaneous intra-abdominal Antimicrobials:  Anti-infectives (From admission, onward)   Start     Dose/Rate Route Frequency Ordered Stop   07/19/19 1000  remdesivir 100 mg in sodium chloride 0.9 % 250 mL IVPB     100 mg 500 mL/hr over 30 Minutes Intravenous Daily 07/17/19 1822 07/21/19 1155   07/18/19 1600  remdesivir 100 mg in sodium chloride 0.9 % 250 mL IVPB     100 mg 500 mL/hr over 30 Minutes Intravenous  Once 07/17/19 1822 07/18/19 1615   07/18/19 0600  vancomycin (VANCOCIN) 1,250 mg in sodium chloride 0.9 % 250 mL IVPB  Status:  Discontinued     1,250 mg 166.7 mL/hr over 90 Minutes Intravenous Every 12 hours 07/17/19 1055 07/19/19 1319   07/17/19 1830  remdesivir 200 mg in sodium chloride 0.9 % 250 mL IVPB     200 mg 500 mL/hr over 30 Minutes Intravenous Once 07/17/19 1822 07/17/19 2314   07/17/19 1100  vancomycin (VANCOCIN) 2,500 mg in sodium chloride 0.9 %  500 mL IVPB     2,500 mg 250 mL/hr over 120 Minutes Intravenous  Once 07/17/19 1004 07/17/19 2037   07/08/19 0600  piperacillin-tazobactam (ZOSYN) IVPB 3.375 g     3.375 g 12.5 mL/hr over 240 Minutes Intravenous Every 8 hours 07/08/19 0129     07/07/19 2315  piperacillin-tazobactam (ZOSYN) IVPB 3.375  g     3.375 g 100 mL/hr over 30 Minutes Intravenous  Once 07/07/19 2314 07/07/19 2356       Objective: Vitals:   07/21/19 1739 07/21/19 2107 07/22/19 0607 07/22/19 1143  BP: (!) 158/79 (!) 159/81 (!) 167/89 (!) 160/78  Pulse:  60 (!) 52 63  Resp:  (!) 21 20 19   Temp:  98.6 F (37 C) (!) 96.9 F (36.1 C)   TempSrc:  Oral    SpO2:  92% 96% 95%  Weight:      Height:        Intake/Output Summary (Last 24 hours) at 07/22/2019 1548 Last data filed at 07/22/2019 1419 Gross per 24 hour  Intake 1624.45 ml  Output 3025 ml  Net -1400.55 ml   Filed Weights   07/07/19 1900  Weight: 133 kg    Examination: General exam: Appears comfortable  HEENT: PERRLA, oral mucosa moist, no sclera icterus or thrush Respiratory system: Clear to auscultation. Respiratory effort normal. Cardiovascular system: S1 & S2 heard, RRR.   Gastrointestinal system: Abdomen soft, mild tenderness across lower abdomen, nondistended. Normal bowel sounds.  Yellowish-brown material present in drain Central nervous system: Alert and oriented. No focal neurological deficits. Extremities: No cyanosis, clubbing or edema Skin: No rashes or ulcers Psychiatry:  Mood & affect appropriate.     Data Reviewed: I have personally reviewed following labs and imaging studies  CBC: Recent Labs  Lab 07/18/19 0449 07/19/19 0416 07/20/19 0510 07/21/19 0437 07/22/19 0500  WBC 12.2* 8.1 6.3 7.6 7.1  NEUTROABS 11.0* 7.2 5.0 6.4 5.4  HGB 10.6* 10.4* 11.0* 10.9* 11.5*  HCT 32.8* 33.4* 33.9* 35.2* 37.0  MCV 86.1 86.5 87.8 87.6 86.4  PLT 372 457* 486* 593* 751*   Basic Metabolic Panel: Recent Labs  Lab 07/17/19 1034 07/18/19 0449 07/19/19 0416 07/20/19 0510 07/21/19 0437 07/22/19 0500  NA 135 136 133* 136 137 137  K 3.6 3.8 4.3 4.3 4.3 4.2  CL 104 105 105 105 104 102  CO2 22 24 22 23 24 26   GLUCOSE 83 151* 152* 146* 169* 135*  BUN 6 14 16 16 15 20   CREATININE 0.72 0.58 0.52 0.57 0.63 0.57  CALCIUM 7.7* 7.6*  7.3* 8.2* 8.7* 9.0  MG 2.1 2.2 2.1  --  2.1  --   PHOS 2.8 3.3 2.6 3.0 3.9  --    GFR: Estimated Creatinine Clearance: 118.7 mL/min (by C-G formula based on SCr of 0.57 mg/dL). Liver Function Tests: Recent Labs  Lab 07/16/19 1433 07/17/19 1034 07/18/19 0449 07/19/19 0416  AST 22 23 30  48*  ALT 15 14 16 23   ALKPHOS 94 87 79 74  BILITOT 0.4 0.7 0.6 0.7  PROT 6.6 6.8 6.6 6.3*  ALBUMIN 2.3* 2.4* 2.2* 2.1*   No results for input(s): LIPASE, AMYLASE in the last 168 hours. No results for input(s): AMMONIA in the last 168 hours. Coagulation Profile: Recent Labs  Lab 07/17/19 1601 07/18/19 0449  INR 1.5* 1.5*   Cardiac Enzymes: No results for input(s): CKTOTAL, CKMB, CKMBINDEX, TROPONINI in the last 168 hours. BNP (last 3 results) No results for input(s): PROBNP in the last  8760 hours. HbA1C: No results for input(s): HGBA1C in the last 72 hours. CBG: Recent Labs  Lab 07/21/19 1650 07/21/19 2200 07/22/19 0611 07/22/19 0750 07/22/19 1130  GLUCAP 176* 162* 136* 115* 104*   Lipid Profile: No results for input(s): CHOL, HDL, LDLCALC, TRIG, CHOLHDL, LDLDIRECT in the last 72 hours. Thyroid Function Tests: No results for input(s): TSH, T4TOTAL, FREET4, T3FREE, THYROIDAB in the last 72 hours. Anemia Panel: Recent Labs    07/21/19 0437 07/22/19 0500  FERRITIN 346* 414*   Urine analysis:    Component Value Date/Time   COLORURINE AMBER (A) 07/07/2019 2210   APPEARANCEUR CLOUDY (A) 07/07/2019 2210   LABSPEC 1.024 07/07/2019 2210   PHURINE 5.0 07/07/2019 2210   GLUCOSEU NEGATIVE 07/07/2019 2210   HGBUR NEGATIVE 07/07/2019 2210   BILIRUBINUR NEGATIVE 07/07/2019 2210   KETONESUR NEGATIVE 07/07/2019 2210   PROTEINUR 30 (A) 07/07/2019 2210   NITRITE NEGATIVE 07/07/2019 2210   LEUKOCYTESUR NEGATIVE 07/07/2019 2210   Sepsis Labs: (procalcitonin:4,lacticidven:4) ) Recent Results (from the past 240 hour(s))  SARS CORONAVIRUS 2 (TAT 6-24 HRS) Nasopharyngeal  Nasopharyngeal Swab     Status: Abnormal   Collection Time: 07/17/19  8:14 AM   Specimen: Nasopharyngeal Swab  Result Value Ref Range Status   SARS Coronavirus 2 POSITIVE (A) NEGATIVE Final    Comment: RESULT CALLED TO, READ BACK BY AND VERIFIED WITH: Cory Munch RN 15:15 07/17/19 ([email or web address]) (NOTE) SARS-CoV-2 target nucleic acids are DETECTED. The SARS-CoV-2 RNA is generally detectable in upper and lower respiratory specimens during the acute phase of infection. Positive results are indicative of the presence of SARS-CoV-2 RNA. Clinical correlation with patient history and other diagnostic information is  necessary to determine patient infection status. Positive results do not rule out bacterial infection or co-infection with other viruses.  The expected result is Negative. Fact Sheet for Patients: HairSlick.no Fact Sheet for Healthcare Providers: quierodirigir.com This test is not yet approved or cleared by the [place] FDA and  has been authorized for detection and/or diagnosis of SARS-CoV-2 by FDA under an Emergency Use Authorization (EUA). This EUA will remain  in effect (meaning this test can be used) for t he duration of the COVID-19 declaration under Section 564(b)(1) of the Act, 21 U.S.C. section 360bbb-3(b)(1), unless the authorization is terminated or revoked sooner. Performed at [facility], [address], [place], [place] [address]   Culture, blood (routine x 2)     Status: None   Collection Time: 07/17/19 10:34 AM   Specimen: BLOOD  Result Value Ref Range Status   Specimen Description   Final    BLOOD RIGHT ARM Performed at [facility], [address], [place], [place] [address]    Special Requests   Final    BOTTLES DRAWN AEROBIC ONLY Blood Culture adequate volume Performed at [facility], [address], [place], [place] [address]    Culture   Final    NO  GROWTH 5 DAYS Performed at [facility], [address], [place], [place] [address]    Report Status 07/22/2019 FINAL  Final  Culture, blood (routine x 2)     Status: None   Collection Time: 07/17/19  4:00 PM   Specimen: BLOOD  Result Value Ref Range Status   Specimen Description   Final    BLOOD RIGHT ANTECUBITAL Performed at [facility], [address] [place], [place] [address]    Special Requests   Final  BOTTLES DRAWN AEROBIC ONLY Blood Culture adequate volume Performed at [facility], [address] [place], [place] [address]    Culture   Final    NO GROWTH 5 DAYS Performed at [facility], [address], [place], [place] [address]    Report Status 07/22/2019 FINAL  Final  Aerobic/Anaerobic Culture (surgical/deep wound)     Status: None (Preliminary result)   Collection Time: 07/18/19 10:59 AM   Specimen: Abscess  Result Value Ref Range Status   Specimen Description   Final    ABSCESS ABDOMEN Performed at [facility], [address], [place], [place] [address]    Special Requests   Final    Normal Performed at [facility], [address], [place], [place] [address]    Gram Stain   Final    ABUNDANT WBC PRESENT,BOTH PMN AND MONONUCLEAR ABUNDANT GRAM POSITIVE COCCI ABUNDANT GRAM NEGATIVE RODS FEW GRAM VARIABLE ROD    Culture   Final    ABUNDANT ESCHERICHIA COLI HOLDING FOR POSSIBLE ANAEROBE Performed at [facility], [address], [place], [place] [address]    Report Status PENDING  Incomplete   Organism ID, Bacteria ESCHERICHIA COLI  Final      Susceptibility   Escherichia coli - MIC*    AMPICILLIN 8 SENSITIVE Sensitive     CEFAZOLIN <=4 SENSITIVE Sensitive     CEFEPIME <=1 SENSITIVE Sensitive     CEFTAZIDIME <=1 SENSITIVE Sensitive     CEFTRIAXONE <=1 SENSITIVE Sensitive     CIPROFLOXACIN >=4 RESISTANT Resistant     GENTAMICIN <=1 SENSITIVE Sensitive      IMIPENEM <=0.25 SENSITIVE Sensitive     TRIMETH/SULFA <=20 SENSITIVE Sensitive     AMPICILLIN/SULBACTAM 4 SENSITIVE Sensitive     PIP/TAZO <=4 SENSITIVE Sensitive     Extended ESBL NEGATIVE Sensitive     * ABUNDANT ESCHERICHIA COLI         Radiology Studies: No results found.    Scheduled Meds: . acetaminophen  1,000 mg Oral Q6H  . ALPRAZolam  0.5 mg Oral BID  . benazepril  20 mg Oral Daily  . busPIRone  30 mg Oral BID  . Chlorhexidine Gluconate Cloth  6 each Topical Daily  . desvenlafaxine  50 mg Oral Daily  . enoxaparin (LOVENOX) injection  60 mg Subcutaneous Q12H  . feeding supplement (PRO-STAT SUGAR FREE 64)  30 mL Oral BID  . gabapentin  300 mg Oral TID  . hydrALAZINE  5 mg Intravenous Once  . insulin aspart  0-15 Units Subcutaneous Q6H  . multivitamin with minerals  1 tablet Oral Daily  . oxyCODONE  5 mg Oral Q6H  . pneumococcal 23 valent vaccine  0.5 mL Intramuscular Tomorrow-1000  . potassium chloride  20 mEq Oral BID  . Ensure Max Protein  11 oz Oral BID  . sodium chloride flush  5 mL Intracatheter Q8H  . topiramate  100 mg Oral Daily   Continuous Infusions: . sodium chloride Stopped (07/20/19 2257)  . piperacillin-tazobactam (ZOSYN)  IV 3.375 g (07/22/19 1224)  . TPN ADULT (ION) 85 mL/hr at 07/21/19 1824  . TPN ADULT (ION)       LOS: 14 days      Calvert Cantor, MD [facility] Pager: [email or web address] Password [facility] 07/22/2019, 3:48 PM

## 2019-07-22 NOTE — Progress Notes (Addendum)
[facility] NOTE   Pharmacy Consult for TPN Indication:  Intra-abd infection  HPI:  51 yoF admit 11/14 with abd pain, acute sigmoid diverticulitis with pneumoperitoneum, ascites  TPN Access: PICC TPN start date: 11/24  Significant events 11/27 - clarified with surgeon to continue TPN   Patient Measurements: Estimated body mass index is 48.79 kg/m as calculated from the following:   Height as of this encounter: 5\' 5"  (1.651 m).   Weight as of this encounter: 293 lb 3.4 oz (133 kg).   Recent Labs    07/20/19 0510 07/21/19 0437 07/22/19 0500  NA 136 137 137  K 4.3 4.3 4.2  CL 105 104 102  CO2 23 24 26   GLUCOSE 146* 169* 135*  BUN 16 15 20   CREATININE 0.57 0.63 0.57  CALCIUM 8.2* 8.7* 9.0  PHOS 3.0 3.9  --   MG  --  2.1  --    ASSESSMENT                                                                                                           Glucose (goal CBG 100-150) - No hx DM, CBGs mostly controlled (range 115-176); no low CBGs  8 units SSI yesterday  Decadron 6 mg/d for COVID  Electrolytes - Stable WNL except CorrCa now borderline high at 10.5  Taking PO potassium 20 mEq daily  Renal - BUN, SCr, bicarb stable WNL; UOP excellent  LFTs - WNL (11/26)  TGs - mildly elevated (11/25)   Prealbumin - < 5 (11/24), (11/25)  Current nutrition - Advanced to soft diet 11/28, looks to be tolerating both pro-stat daily (provides 15 mg protein); refusing most Ensure Max  MIVF - NS  at 40 ml/hr  NUTRITIONAL GOALS         Estimated Nutritional Needs - Per RD recommendations on 11/24 Kcal:  1995-2260 kcal Protein:  100-113 grams Fluid:  >/= 2 L/day  TPN at goal rate of 85 ml/hr will yield: 112 gm protein, 2105 kCal per day   PLAN                                                                                                                         At 1800 today:  Reduce custom TPN to 65 ml/hr with consistent intake of  Pro-Stat  Continue PO multivitamin/trace elements  Electrolytes: decrease Ca, no other changes; Cl:Ac = 1:1; OK to continue PO Kdur for now  M[facility]F at [facility]  Continue moderate SSI with q6h CBG  checks  TPN lab panels on Mondays & Thursdays    Bernadene Person, PharmD, BCPS [phone] 07/22/2019, 10:33 AM

## 2019-07-23 LAB — COMPREHENSIVE METABOLIC PANEL
ALT: 27 U/L (ref 0–44)
AST: 23 U/L (ref 15–41)
Albumin: 2.5 g/dL — ABNORMAL LOW (ref 3.5–5.0)
Alkaline Phosphatase: 88 U/L (ref 38–126)
Anion gap: 11 (ref 5–15)
BUN: 23 mg/dL — ABNORMAL HIGH (ref 6–20)
CO2: 26 mmol/L (ref 22–32)
Calcium: 9.1 mg/dL (ref 8.9–10.3)
Chloride: 99 mmol/L (ref 98–111)
Creatinine, Ser: 0.66 mg/dL (ref 0.44–1.00)
GFR calc Af Amer: >60 mL/min (ref >60)
GFR calc non Af Amer: >60 mL/min (ref >60)
Glucose, Bld: 133 mg/dL — ABNORMAL HIGH (ref 70–99)
Potassium: 4.4 mmol/L (ref 3.5–5.1)
Sodium: 136 mmol/L (ref 135–145)
Total Bilirubin: 0.2 mg/dL — ABNORMAL LOW (ref 0.3–1.2)
Total Protein: 7.3 g/dL (ref 6.5–8.1)

## 2019-07-23 LAB — DIFFERENTIAL
Abs Immature Granulocytes: 0.06 10*3/uL (ref 0.00–0.07)
Basophils Absolute: 0.1 10*3/uL (ref 0.0–0.1)
Basophils Relative: 1 %
Eosinophils Absolute: 0.2 10*3/uL (ref 0.0–0.5)
Eosinophils Relative: 2 %
Immature Granulocytes: 1 %
Lymphocytes Relative: 20 %
Lymphs Abs: 2 10*3/uL (ref 0.7–4.0)
Monocytes Absolute: 1 10*3/uL (ref 0.1–1.0)
Monocytes Relative: 9 %
Neutro Abs: 6.8 10*3/uL (ref 1.7–7.7)
Neutrophils Relative %: 67 %

## 2019-07-23 LAB — AEROBIC/ANAEROBIC CULTURE W GRAM STAIN (SURGICAL/DEEP WOUND): Special Requests: NORMAL

## 2019-07-23 LAB — TRIGLYCERIDES: Triglycerides: 208 mg/dL — ABNORMAL HIGH (ref <150)

## 2019-07-23 LAB — GLUCOSE, CAPILLARY
Glucose-Capillary: 132 mg/dL — ABNORMAL HIGH (ref 70–99)
Glucose-Capillary: 136 mg/dL — ABNORMAL HIGH (ref 70–99)
Glucose-Capillary: 157 mg/dL — ABNORMAL HIGH (ref 70–99)
Glucose-Capillary: 144 mg/dL — ABNORMAL HIGH (ref 70–99)
Glucose-Capillary: 163 mg/dL — ABNORMAL HIGH (ref 70–99)

## 2019-07-23 LAB — MAGNESIUM: Magnesium: 2 mg/dL (ref 1.7–2.4)

## 2019-07-23 LAB — HEMOGLOBIN A1C
Hgb A1c MFr Bld: 6.1 % — ABNORMAL HIGH (ref 4.8–5.6)
Mean Plasma Glucose: 128.37 mg/dL

## 2019-07-23 LAB — PREALBUMIN: Prealbumin: 25.4 mg/dL (ref 18–38)

## 2019-07-23 LAB — PHOSPHORUS: Phosphorus: 4.9 mg/dL — ABNORMAL HIGH (ref 2.5–4.6)

## 2019-07-23 MED ORDER — GABAPENTIN 300 MG PO CAPS
600.0000 mg | ORAL_CAPSULE | ORAL | Status: DC
  Administered 2019-07-23 – 2019-07-26 (×7): 600 mg via ORAL
  Filled 2019-07-23 (×5): qty 2

## 2019-07-23 MED ORDER — GUAIFENESIN ER 600 MG PO TB12
600.0000 mg | ORAL_TABLET | Freq: Two times a day (BID) | ORAL | Status: DC
  Administered 2019-07-23 – 2019-07-26 (×7): 600 mg via ORAL
  Filled 2019-07-23 (×7): qty 1

## 2019-07-23 MED ORDER — INSULIN ASPART 100 UNIT/ML ~~LOC~~ SOLN
0.0000 [IU] | Freq: Three times a day (TID) | SUBCUTANEOUS | Status: DC
  Administered 2019-07-23 (×2): 2 [IU] via SUBCUTANEOUS
  Administered 2019-07-25: 1 [IU] via SUBCUTANEOUS

## 2019-07-23 MED ORDER — GABAPENTIN 300 MG PO CAPS
900.0000 mg | ORAL_CAPSULE | Freq: Every day | ORAL | Status: DC
  Administered 2019-07-23 – 2019-07-26 (×4): 900 mg via ORAL
  Filled 2019-07-23 (×5): qty 3

## 2019-07-23 MED ORDER — TRAVASOL 10 % IV SOLN: INTRAVENOUS | Status: AC

## 2019-07-23 NOTE — Progress Notes (Signed)
PHARMACY - ADULT TOTAL PARENTERAL NUTRITION CONSULT NOTE   Pharmacy Consult for TPN Indication:  Perforation of sigmoid colon 2/2 diverticulitis  HPI:  43 yoF admit 11/14 with abd pain, acute sigmoid diverticulitis with pneumoperitoneum, ascites. Pt is COVID +.  TPN Access: Double lumen PICC TPN start date: 11/24  Significant events 11/27 - clarified with surgeon to continue TPN 11/29 - TPN rate reduced, diet advanced 11/30 - wean off of TPN  Patient Measurements: Estimated body mass index is 48.79 kg/m as calculated from the following:   Height as of this encounter: 5\' 5"  (1.651 m).   Weight as of this encounter: 293 lb 3.4 oz (133 kg).   Recent Labs    07/21/19 0437 07/22/19 0500 07/23/19 0425  NA 137 137 136  K 4.3 4.2 4.4  CL 104 102 99  CO2 24 26 26   GLUCOSE 169* 135* 133*  BUN 15 20 23*  CREATININE 0.63 0.57 0.66  CALCIUM 8.7* 9.0 9.1  PHOS 3.9  --  4.9*  MG 2.1  --  2.0  ALBUMIN  --   --  2.5*  ALKPHOS  --   --  88  AST  --   --  23  ALT  --   --  27  BILITOT  --   --  0.2*  TRIG  --   --  208*  PREALBUMIN  --   --  25.4   ASSESSMENT                                                                       Glucose (goal CBG 100-150) - No hx DM, CBGs controlled (range 116-133); no low CBGs  4 units SSI yesterday  Dexamethasone course completed on 11/28  Electrolytes -  WNL except CorrCa at upper end of normal (10.3), Phos elevated (4.9)  Pt taking KCl 20 mEq PO BID   Renal - BUN slightly elevated; SCr, bicarb stable WNL;   LFTs - WNL (11/30)  TGs - slightly elevated (208)    Prealbumin - 25.4, improved to WNL  Current nutrition - Diet advanced, patient ate breakfast this morning that included pancakes and omelette.   MIVF - NS at [facility]    Estimated Nutritional Needs - Per RD recommendations on 11/24 Kcal:  1995-2260 kcal Protein:  100-113 grams Fluid:  >/= 2 L/day  TPN at goal rate of 85 ml/hr will yield: 112 gm protein,  2105 kCal per day   PLAN                                                                                        Reduce TPN to half rate now and discontinue this evening. Ok to discontinue per discussion with surgery.  M[facility]F at [facility], MD to adjust as needed  Change CBG SSI to sensitive with meals. CBG checks TID AC + qHS.  Pharmacy to sign off and discontinue TPN labs.   Cindi Carbon, PharmD 07/23/19 11:22 AM

## 2019-07-23 NOTE — Progress Notes (Signed)
SATURATION QUALIFICATIONS: (This note is used to comply with regulatory documentation for home oxygen)  Patient Saturations on Room Air at Rest = 89%  Patient Saturations on Room Air while Ambulating = 86%  Patient Saturations on 2 Liters of oxygen while Ambulating = 93%  Please briefly explain why patient needs home oxygen:

## 2019-07-23 NOTE — Progress Notes (Addendum)
Referring Physician(s): Dr. Marcello Moores  Supervising Physician: Sandi Mariscal  Patient Status:  [facility] - In-pt  Chief Complaint: Perforated sigmoid colon s/p inta abdominal abscess drain X 3 placement on 11.25.20    Allergies: Patient has no known allergies.  Medications: Prior to Admission medications   Medication Sig Start Date End Date Taking? Authorizing Provider  ALPRAZolam Duanne Moron) 0.5 MG tablet Take 0.5 mg by mouth 2 (two) times daily as needed. 06/30/19  Yes [provider]  aspirin EC 81 MG tablet Take 81 mg by mouth daily.   Yes [provider]  benazepril (LOTENSIN) 20 MG tablet Take 20 mg by mouth daily.   Yes [provider]  busPIRone (BUSPAR) 30 MG tablet Take 1 tablet by mouth 2 (two) times daily. 06/29/19  Yes [provider]  desvenlafaxine (PRISTIQ) 50 MG 24 hr tablet Take 50 mg by mouth daily. 07/04/19  Yes [provider]  gabapentin (NEURONTIN) 300 MG capsule  01/25/18  Yes [provider]  naproxen (NAPROSYN) 500 MG tablet  02/04/18  Yes [provider]  tiZANidine (ZANAFLEX) 4 MG tablet  02/04/18  Yes [provider]  topiramate (TOPAMAX) 100 MG tablet Take 100 mg by mouth daily.  06/29/19  Yes [provider]  venlafaxine XR (EFFEXOR-XR) 150 MG 24 hr capsule Take 1 capsule by mouth daily. 06/29/19  Yes [provider]  aspirin EC 325 MG tablet Take 325 mg by mouth daily as needed (headache).    [provider]  busPIRone (BUSPAR) 15 MG tablet Take 15 mg by mouth 2 (two) times daily. 07/06/19   [provider]     Vital Signs: BP (!) 143/84 (BP Location: Right Leg)   Pulse 82   Temp 97.9 F (36.6 C) (Oral)   Resp 16   Ht 5\' 5"  (1.651 m)   Wt 293 lb 3.4 oz (133 kg)   LMP 05/27/2019   SpO2 95%   BMI 48.79 kg/m    Imaging: No results found.  Labs:  CBC: Recent Labs    07/19/19 0416 07/20/19 0510 07/21/19 0437 07/22/19 0500  WBC 8.1 6.3 7.6 7.1   HGB 10.4* 11.0* 10.9* 11.5*  HCT 33.4* 33.9* 35.2* 37.0  PLT 457* 486* 593* 707*    COAGS: Recent Labs    07/17/19 1601 07/18/19 0449  INR 1.5* 1.5*    BMP: Recent Labs    07/20/19 0510 07/21/19 0437 07/22/19 0500 07/23/19 0425  NA 136 137 137 136  K 4.3 4.3 4.2 4.4  CL 105 104 102 99  CO2 23 24 26 26   GLUCOSE 146* 169* 135* 133*  BUN 16 15 20  23*  CALCIUM 8.2* 8.7* 9.0 9.1  CREATININE 0.57 0.63 0.57 0.66  GFRNONAA >60 >60 >60 >60  GFRAA >60 >60 >60 >60    LIVER FUNCTION TESTS: Recent Labs    07/17/19 1034 07/18/19 0449 07/19/19 0416 07/23/19 0425  BILITOT 0.7 0.6 0.7 0.2*  AST 23 30 48* 23  ALT 14 16 23 27   ALKPHOS 87 79 74 88  PROT 6.8 6.6 6.3* 7.3  ALBUMIN 2.4* 2.2* 2.1* 2.5*    Assessment and Plan:  48 y.o. female history of diverticulitis admitted to [facility] for abdominal pain found to have a acute sigmoid diverticulitis with pneumoperitoneum and ascites s/p intraabdominal abscess drain X 3 on 11.25.20  Per Epic output is as follows: LLQ: 20 ml, 50 ml, 30 ml RLQ: 15 ml, 70 ml, 60 ml Right anterior hip:  30 ml, 65 ml, 70 ml  Per surgical note dated 11.30.20 output is cloudy serous in 2 drains conected to bulb suction device. Feculent oputput in the third drain (LLQ)  which is placed to gravity bag. Team is concerned for possible fistula.   No pertinent imaging since drain placement, WBC from 11.27.20 - 6.3. Per Epic no fevers.  Recommend team continue with flushing TID, output recording q shift and dressing changes as needed. Would consider additional imaging when output is less than 10 ml for 24 hours not including flush material.

## 2019-07-23 NOTE — Progress Notes (Signed)
Asleep in bed with oxygen on.  resp even & unlabored.

## 2019-07-23 NOTE — Progress Notes (Signed)
PROGRESS NOTE    Holly Leretha DykesDawn Cuffie   ION:[id number]:[id number]  DOB: 1970-10-21  DOA: 07/07/2019 PCP: Porfirio OarJeffery, [place], PA   Brief Narrative:  Lam Dawn Miklas 48 year old morbidly obese female with essential hypertension, fibromyalgia, history of PE off anticoagulation, alcohol and tobacco use who is admitted to surgical service with complicated acute sigmoid diverticulitis with developing fluid collection and abscess.  Abdominal CT from 11/18 showed diffuse colon diverticulosis with worsening inflammation around sigmoid colon and increased pneumoperitoneum with perihepatic ascites.  Repeat CT on 11/23 with possible abscesses.  Patient on empiric IV Zosyn and IR consulted for image guided drainage.  Hospitalist consulted for intermittent fever with new bilateral upper lobe infiltrate with effusion, increasing leukocytosis.  On 11/23 rapid response was called due to fever of 103F with tachycardia and tachypnea.  CT angiogram of the chest was negative for PE but showed extensive patchy bilateral pulmonary infiltrate in the upper lobes with small bilateral pleural effusion (L >R) reportedly patient had history of Covid in the family about 1 week prior to hospitalization.  Covid PCR test were checked on admission on 11/14.  Given her acute respiratory symptoms COVID-19 was tested again and came back positive.  Along with that she was found to have elevated D-dimer of 9.1, CRP of 30, fibrinogen of 700 and normal ferritin.  Patient placed on airborne and contact precautions and started on empiric Decadron and remdesivir.   Subjective: She has no new complaints. Continues to have a cough with mild congestion. Has dypnea when walking.    Assessment & Plan:   Principal Problem: Perforation of sigmoid colon due to diverticulitis -11/25-percutaneous drains placed -Continue IV Zosyn-cultures are growing E. coli  -She continues to have output from her drain and therefore it has not yet been removed  -Diet has been advanced to soft food and I suspect we may be able to start weaning the TPN- will let General surgery manage this  Active Problems:    COVID-19 virus infection -She has been treated with Decadron and remdesivir and is recuperating well from this-she has residual mild dry cough and dyspnea- add Mucinex- check ambulatory pulse ox  Severe sepsis -Secondary to above-mentioned infections which are now coming under control  Sinus bradycardia She is asymptomatic from this  Essential hypertension -Continue Lotensin  Morbid obesity -Body mass index is 48.79 kg/m.  Tobacco abuse -Patient stated that she stopped smoking about 10 days prior to admission  Anxiety/depression - cont home meds  Time spent in minutes: 35 DVT prophylaxis: Lovenox Code Status: Full code Family Communication: None Disposition Plan: Home when stable Consultants:   General surgery  IR Procedures:   Transcutaneous intra-abdominal Antimicrobials:  Anti-infectives (From admission, onward)   Start     Dose/Rate Route Frequency Ordered Stop   07/19/19 1000  remdesivir 100 mg in sodium chloride 0.9 % 250 mL IVPB     100 mg 500 mL/hr over 30 Minutes Intravenous Daily 07/17/19 1822 07/21/19 1155   07/18/19 1600  remdesivir 100 mg in sodium chloride 0.9 % 250 mL IVPB     100 mg 500 mL/hr over 30 Minutes Intravenous  Once 07/17/19 1822 07/18/19 1615   07/18/19 0600  vancomycin (VANCOCIN) 1,250 mg in sodium chloride 0.9 % 250 mL IVPB  Status:  Discontinued     1,250 mg 166.7 mL/hr over 90 Minutes Intravenous Every 12 hours 07/17/19 1055 07/19/19 1319   07/17/19 1830  remdesivir 200 mg in sodium chloride 0.9 % 250 mL IVPB  200 mg 500 mL/hr over 30 Minutes Intravenous Once 07/17/19 1822 07/17/19 2314   07/17/19 1100  vancomycin (VANCOCIN) 2,500 mg in sodium chloride 0.9 % 500 mL IVPB     2,500 mg 250 mL/hr over 120 Minutes Intravenous  Once 07/17/19 1004 07/17/19 2037   07/08/19 0600   piperacillin-tazobactam (ZOSYN) IVPB 3.375 g     3.375 g 12.5 mL/hr over 240 Minutes Intravenous Every 8 hours 07/08/19 0129     07/07/19 2315  piperacillin-tazobactam (ZOSYN) IVPB 3.375 g     3.375 g 100 mL/hr over 30 Minutes Intravenous  Once 07/07/19 2314 07/07/19 2356       Objective: Vitals:   07/22/19 1143 07/22/19 2034 07/23/19 0341 07/23/19 1421  BP: (!) 160/78 (!) 129/96 (!) 151/70 (!) 143/84  Pulse: 63 93 76 82  Resp: 19 (!) 24 (!) 24 16  Temp:  (!) 97.1 F (36.2 C) 98.3 F (36.8 C) 97.9 F (36.6 C)  TempSrc:  Oral Oral Oral  SpO2: 95% 91% 91% 95%  Weight:      Height:        Intake/Output Summary (Last 24 hours) at 07/23/2019 1510 Last data filed at 07/23/2019 1400 Gross per 24 hour  Intake 739.04 ml  Output 2665 ml  Net -1925.96 ml   Filed Weights   07/07/19 1900  Weight: 133 kg    Examination: General exam: Appears comfortable  HEENT: PERRLA, oral mucosa moist, no sclera icterus or thrush Respiratory system: Clear to auscultation. Respiratory effort normal. Cardiovascular system: S1 & S2 heard,  No murmurs  Gastrointestinal system: Abdomen soft, nondistended. Normal bowel sounds - drains present with yellow brown discharge in bulbs  Central nervous system: Alert and oriented. No focal neurological deficits. Extremities: No cyanosis, clubbing or edema Skin: No rashes or ulcers Psychiatry:  Mood & affect appropriate.     Data Reviewed: I have personally reviewed following labs and imaging studies  CBC: Recent Labs  Lab 07/18/19 0449 07/19/19 0416 07/20/19 0510 07/21/19 0437 07/22/19 0500 07/23/19 0425  WBC 12.2* 8.1 6.3 7.6 7.1  --   NEUTROABS 11.0* 7.2 5.0 6.4 5.4 6.8  HGB 10.6* 10.4* 11.0* 10.9* 11.5*  --   HCT 32.8* 33.4* 33.9* 35.2* 37.0  --   MCV 86.1 86.5 87.8 87.6 86.4  --   PLT 372 457* 486* 593* 707*  --    Basic Metabolic Panel: Recent Labs  Lab 07/17/19 1034 07/18/19 0449 07/19/19 0416 07/20/19 0510 07/21/19 0437  07/22/19 0500 07/23/19 0425  NA 135 136 133* 136 137 137 136  K 3.6 3.8 4.3 4.3 4.3 4.2 4.4  CL 104 105 105 105 104 102 99  CO2 22 24 22 23 24 26 26   GLUCOSE 83 151* 152* 146* 169* 135* 133*  BUN 6 14 16 16 15 20  23*  CREATININE 0.72 0.58 0.52 0.57 0.63 0.57 0.66  CALCIUM 7.7* 7.6* 7.3* 8.2* 8.7* 9.0 9.1  MG 2.1 2.2 2.1  --  2.1  --  2.0  PHOS 2.8 3.3 2.6 3.0 3.9  --  4.9*   GFR: Estimated Creatinine Clearance: 118.7 mL/min (by C-G formula based on SCr of 0.66 mg/dL). Liver Function Tests: Recent Labs  Lab 07/17/19 1034 07/18/19 0449 07/19/19 0416 07/23/19 0425  AST 23 30 48* 23  ALT 14 16 23 27   ALKPHOS 87 79 74 88  BILITOT 0.7 0.6 0.7 0.2*  PROT 6.8 6.6 6.3* 7.3  ALBUMIN 2.4* 2.2* 2.1* 2.5*   No results for input(s):  LIPASE, AMYLASE in the last 168 hours. No results for input(s): AMMONIA in the last 168 hours. Coagulation Profile: Recent Labs  Lab 07/17/19 1601 07/18/19 0449  INR 1.5* 1.5*   Cardiac Enzymes: No results for input(s): CKTOTAL, CKMB, CKMBINDEX, TROPONINI in the last 168 hours. BNP (last 3 results) No results for input(s): PROBNP in the last 8760 hours. HbA1C: Recent Labs    07/23/19 0425  HGBA1C 6.1*   CBG: Recent Labs  Lab 07/22/19 1650 07/22/19 2047 07/23/19 0337 07/23/19 0812 07/23/19 1215  GLUCAP 116* 137* 132* 136* 157*   Lipid Profile: Recent Labs    07/23/19 0425  TRIG 208*   Thyroid Function Tests: No results for input(s): TSH, T4TOTAL, FREET4, T3FREE, THYROIDAB in the last 72 hours. Anemia Panel: Recent Labs    07/21/19 0437 07/22/19 0500  FERRITIN 346* 414*   Urine analysis:    Component Value Date/Time   COLORURINE AMBER (A) 07/07/2019 2210   APPEARANCEUR CLOUDY (A) 07/07/2019 2210   LABSPEC 1.024 07/07/2019 2210   PHURINE 5.0 07/07/2019 2210   GLUCOSEU NEGATIVE 07/07/2019 2210   HGBUR NEGATIVE 07/07/2019 2210   BILIRUBINUR NEGATIVE 07/07/2019 2210   KETONESUR NEGATIVE 07/07/2019 2210   PROTEINUR 30 (A)  07/07/2019 2210   NITRITE NEGATIVE 07/07/2019 2210   LEUKOCYTESUR NEGATIVE 07/07/2019 2210   Sepsis Labs: @LABRCNTIP (procalcitonin:4,lacticidven:4) ) Recent Results (from the past 240 hour(s))  SARS CORONAVIRUS 2 (TAT 6-24 HRS) Nasopharyngeal Nasopharyngeal Swab     Status: Abnormal   Collection Time: 07/17/19  8:14 AM   Specimen: Nasopharyngeal Swab  Result Value Ref Range Status   SARS Coronavirus 2 POSITIVE (A) NEGATIVE Final    Comment: RESULT CALLED TO, READ BACK BY AND VERIFIED WITH: 07/19/19 RN 15:15 07/17/19 ([email or web address]) (NOTE) SARS-CoV-2 target nucleic acids are DETECTED. The SARS-CoV-2 RNA is generally detectable in upper and lower respiratory specimens during the acute phase of infection. Positive results are indicative of the presence of SARS-CoV-2 RNA. Clinical correlation with patient history and other diagnostic information is  necessary to determine patient infection status. Positive results do not rule out bacterial infection or co-infection with other viruses.  The expected result is Negative. Fact Sheet for Patients: 07/19/19 Fact Sheet for Healthcare Providers: HairSlick.no This test is not yet approved or cleared by the quierodirigir.com FDA and  has been authorized for detection and/or diagnosis of SARS-CoV-2 by FDA under an Emergency Use Authorization (EUA). This EUA will remain  in effect (meaning this test can be used) for t he duration of the COVID-19 declaration under Section 564(b)(1) of the Act, 21 U.S.C. section 360bbb-3(b)(1), unless the authorization is terminated or revoked sooner. Performed at [facility], [address], [place], [place]   Culture, blood (routine x 2)     Status: None   Collection Time: 07/17/19 10:34 AM   Specimen: BLOOD  Result Value Ref Range Status   Specimen Description   Final    BLOOD RIGHT ARM Performed at [facility],  [address] [place], [place]    Special Requests   Final    BOTTLES DRAWN AEROBIC ONLY Blood Culture adequate volume Performed at [facility], [address] [place], [place]    Culture   Final    NO GROWTH 5 DAYS Performed at [facility], [address] [place], [place]    Report Status 07/22/2019 FINAL  Final  Culture, blood (routine x 2)  Status: None   Collection Time: 07/17/19  4:00 PM   Specimen: BLOOD  Result Value Ref Range Status   Specimen Description   Final    BLOOD RIGHT ANTECUBITAL Performed at [facility], [address] [place], [place] [address]    Special Requests   Final    BOTTLES DRAWN AEROBIC ONLY Blood Culture adequate volume Performed at [facility], [address], [place], [place] [address]    Culture   Final    NO GROWTH 5 DAYS Performed at [facility], [address], [place], [place] [address]    Report Status 07/22/2019 FINAL  Final  Aerobic/Anaerobic Culture (surgical/deep wound)     Status: None   Collection Time: 07/18/19 10:59 AM   Specimen: Abscess  Result Value Ref Range Status   Specimen Description   Final    ABSCESS ABDOMEN Performed at [facility], [address], [place], [place] [address]    Special Requests   Final    Normal Performed at [facility], [address], [place], [place] [address]    Gram Stain   Final    ABUNDANT WBC PRESENT,BOTH PMN AND MONONUCLEAR ABUNDANT GRAM POSITIVE COCCI ABUNDANT GRAM NEGATIVE RODS FEW GRAM VARIABLE ROD Performed at [facility], [address], [place], [place] [address]    Culture   Final    ABUNDANT ESCHERICHIA COLI ABUNDANT BACTEROIDES SPECIES NOT FRAGILIS BETA LACTAMASE POSITIVE MODERATE STREPTOCOCCUS GROUP C    Report Status 07/23/2019 FINAL  Final   Organism ID, Bacteria ESCHERICHIA COLI  Final      Susceptibility    Escherichia coli - MIC*    AMPICILLIN 8 SENSITIVE Sensitive     CEFAZOLIN <=4 SENSITIVE Sensitive     CEFEPIME <=1 SENSITIVE Sensitive     CEFTAZIDIME <=1 SENSITIVE Sensitive     CEFTRIAXONE <=1 SENSITIVE Sensitive     CIPROFLOXACIN >=4 RESISTANT Resistant     GENTAMICIN <=1 SENSITIVE Sensitive     IMIPENEM <=0.25 SENSITIVE Sensitive     TRIMETH/SULFA <=20 SENSITIVE Sensitive     AMPICILLIN/SULBACTAM 4 SENSITIVE Sensitive     PIP/TAZO <=4 SENSITIVE Sensitive     Extended ESBL NEGATIVE Sensitive     * ABUNDANT ESCHERICHIA COLI         Radiology Studies: No results found.    Scheduled Meds: . acetaminophen  1,000 mg Oral Q6H  . ALPRAZolam  0.5 mg Oral BID  . benazepril  20 mg Oral Daily  . busPIRone  30 mg Oral BID  . Chlorhexidine Gluconate Cloth  6 each Topical Daily  . desvenlafaxine  50 mg Oral Daily  . enoxaparin (LOVENOX) injection  60 mg Subcutaneous Q12H  . feeding supplement (PRO-STAT SUGAR FREE 64)  30 mL Oral BID  . gabapentin  600 mg Oral 2 times per day  . gabapentin  900 mg Oral Q supper  . guaiFENesin  600 mg Oral BID  . insulin aspart  0-9 Units Subcutaneous TID WC  . multivitamin with minerals  1 tablet Oral Daily  . oxyCODONE  5 mg Oral Q6H  . potassium chloride  20 mEq Oral BID  . Ensure Max Protein  11 oz Oral BID  . sodium chloride flush  5 mL Intracatheter Q8H  . topiramate  100 mg Oral Daily   Continuous Infusions: . sodium chloride Stopped (07/20/19 2257)  . piperacillin-tazobactam (ZOSYN)  IV 3.375 g (07/23/19 1359)  . TPN ADULT (  ION) 30 mL/hr at 07/23/19 1221     LOS: 15 days      Calvert Cantor, MD [facility] Pager: [email or web address] Password [email or web address] 07/23/2019, 3:10 PM

## 2019-07-23 NOTE — Progress Notes (Addendum)
Patient ID: Holly Moon, female   DOB: 26-Dec-1970, 48 y.o.   MRN: [id number]       Subjective: No new complaints today.  Tolerating a regular diet.  Only pain is when she moves.  Still on TNA, still on O2 for her COVID  ROS: See above, otherwise other systems negative  Objective: Vital signs in last 24 hours: Temp:  [97.1 F (36.2 C)-98.3 F (36.8 C)] 98.3 F (36.8 C) (11/30 0341) Pulse Rate:  [63-93] 76 (11/30 0341) Resp:  [19-24] 24 (11/30 0341) BP: (129-160)/(70-96) 151/70 (11/30 0341) SpO2:  [91 %-95 %] 91 % (11/30 0341) Last BM Date: 07/21/19  Intake/Output from previous day: 11/29 0701 - 11/30 0700 In: 1018.1 [P.O.:360; I.V.:519.5; IV Piggyback:93.5] Out: 4265 [Urine:4250; Drains:15] Intake/Output this shift: No intake/output data recorded.  PE: Abd: soft, not really tender, +BS, obese, ecchymosis from subc injections, 2 bulb drains with cloudy, serous output.  Documented 0 cc of output but there is 5-10cc in each drain.  Gravity bag with feculent output and around 30cc present.  Lab Results:  Recent Labs    07/21/19 0437 07/22/19 0500  WBC 7.6 7.1  HGB 10.9* 11.5*  HCT 35.2* 37.0  PLT 593* 707*   BMET Recent Labs    07/22/19 0500 07/23/19 0425  NA 137 136  K 4.2 4.4  CL 102 99  CO2 26 26  GLUCOSE 135* 133*  BUN 20 23*  CREATININE 0.57 0.66  CALCIUM 9.0 9.1   PT/INR No results for input(s): LABPROT, INR in the last 72 hours. CMP     Component Value Date/Time   NA 136 07/23/2019 0425   K 4.4 07/23/2019 0425   CL 99 07/23/2019 0425   CO2 26 07/23/2019 0425   GLUCOSE 133 (H) 07/23/2019 0425   BUN 23 (H) 07/23/2019 0425   CREATININE 0.66 07/23/2019 0425   CALCIUM 9.1 07/23/2019 0425   PROT 7.3 07/23/2019 0425   ALBUMIN 2.5 (L) 07/23/2019 0425   AST 23 07/23/2019 0425   ALT 27 07/23/2019 0425   ALKPHOS 88 07/23/2019 0425   BILITOT 0.2 (L) 07/23/2019 0425   GFRNONAA >60 07/23/2019 0425   GFRAA >60 07/23/2019 0425   Lipase      Component Value Date/Time   LIPASE 17 07/07/2019 2057       Studies/Results: No results found.  Anti-infectives: Anti-infectives (From admission, onward)   Start     Dose/Rate Route Frequency Ordered Stop   07/19/19 1000  remdesivir 100 mg in sodium chloride 0.9 % 250 mL IVPB     100 mg 500 mL/hr over 30 Minutes Intravenous Daily 07/17/19 1822 07/21/19 1155   07/18/19 1600  remdesivir 100 mg in sodium chloride 0.9 % 250 mL IVPB     100 mg 500 mL/hr over 30 Minutes Intravenous  Once 07/17/19 1822 07/18/19 1615   07/18/19 0600  vancomycin (VANCOCIN) 1,250 mg in sodium chloride 0.9 % 250 mL IVPB  Status:  Discontinued     1,250 mg 166.7 mL/hr over 90 Minutes Intravenous Every 12 hours 07/17/19 1055 07/19/19 1319   07/17/19 1830  remdesivir 200 mg in sodium chloride 0.9 % 250 mL IVPB     200 mg 500 mL/hr over 30 Minutes Intravenous Once 07/17/19 1822 07/17/19 2314   07/17/19 1100  vancomycin (VANCOCIN) 2,500 mg in sodium chloride 0.9 % 500 mL IVPB     2,500 mg 250 mL/hr over 120 Minutes Intravenous  Once 07/17/19 1004 07/17/19 2037   07/08/19 0600  piperacillin-tazobactam (ZOSYN) IVPB 3.375 g     3.375 g 12.5 mL/hr over 240 Minutes Intravenous Every 8 hours 07/08/19 0129     07/07/19 2315  piperacillin-tazobactam (ZOSYN) IVPB 3.375 g     3.375 g 100 mL/hr over 30 Minutes Intravenous  Once 07/07/19 2314 07/07/19 2356       Assessment/Plan Covid positive: 07/17/2019 (Covid negative: 07/07/2019) Hx IBS/questionable Crohn's disease AKI Hypertension Fibromyalgia-generalized Back,hip,and leg pain Hx PE-off anticoagulants Tobacco use EtOH use Morbid obesity-BMI 48.79 Extensive bilateral patchy pulmonary infiltrates primarlyupper lobes; small bilateral effusions - Medicine consult Severe malnutrition - prealbumin <5  Acute sigmoid diverticulitis with multiple intra-abdominal fluid collections, 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   -2 bulb  with cloudy serous output -gravity bag with feculent output suggestive of a fistula -cxs show E coli, resistant only to cipro -cont zosyn -will need repeat CT scan at some point in the next couple of days as drainage from 2 bulbs continues to decrease. -WBC is normal.  When ready for DC home from medical standpoint can switch from IV abx therapy to oral augmentin -surgically stable and ready for DC when medically stable from COVID   VOZ:DGUY diet, wean TNA to off, cont ensure ID: Zosyn 11/14>>day16 DVT: Lovenox Follow-up: TBD   LOS: 15 days    [name] , [facility] 07/23/2019, 11:07 AM Please see Amion for pager number during day hours 7:00am-4:30pm  Agree with above.  Ovidio Kin, MD, [facility] Surgery Office phone:  [phone]

## 2019-07-24 ENCOUNTER — Inpatient Hospital Stay (HOSPITAL_COMMUNITY): Payer: BC Managed Care – PPO

## 2019-07-24 LAB — BASIC METABOLIC PANEL
Anion gap: 10 (ref 5–15)
BUN: 26 mg/dL — ABNORMAL HIGH (ref 6–20)
CO2: 26 mmol/L (ref 22–32)
Calcium: 9.2 mg/dL (ref 8.9–10.3)
Chloride: 100 mmol/L (ref 98–111)
Creatinine, Ser: 0.82 mg/dL (ref 0.44–1.00)
GFR calc Af Amer: >60 mL/min (ref >60)
GFR calc non Af Amer: >60 mL/min (ref >60)
Glucose, Bld: 129 mg/dL — ABNORMAL HIGH (ref 70–99)
Potassium: 4.5 mmol/L (ref 3.5–5.1)
Sodium: 136 mmol/L (ref 135–145)

## 2019-07-24 LAB — GLUCOSE, CAPILLARY
Glucose-Capillary: 107 mg/dL — ABNORMAL HIGH (ref 70–99)
Glucose-Capillary: 106 mg/dL — ABNORMAL HIGH (ref 70–99)
Glucose-Capillary: 101 mg/dL — ABNORMAL HIGH (ref 70–99)
Glucose-Capillary: 128 mg/dL — ABNORMAL HIGH (ref 70–99)

## 2019-07-24 MED ORDER — AMOXICILLIN-POT CLAVULANATE 875-125 MG PO TABS: 1.0000 | ORAL_TABLET | Freq: Two times a day (BID) | ORAL | 0 refills | Status: AC

## 2019-07-24 MED ORDER — AMOXICILLIN-POT CLAVULANATE 875-125 MG PO TABS
1.0000 | ORAL_TABLET | Freq: Two times a day (BID) | ORAL | Status: DC
  Administered 2019-07-24 – 2019-07-26 (×5): 1 via ORAL
  Filled 2019-07-24 (×5): qty 1

## 2019-07-24 NOTE — Progress Notes (Signed)
Patient ID: Holly Moon, female   DOB: May 09, 1971, 48 y.o.   MRN: [id number] Pt s/p drainage of 3 abd fluid collections on 11/25; last temp 99.1; creat nl; drain fluid cx- e coli, bacteroides, group C strept; drain outputs have ranged from 5-20 cc with feculent appearing fluid in LLQ drain; case d/w Dr. Pascal Lux and he recommends f/u CT A/P before pt is discharged; [facility] aware; will also inform [facility]

## 2019-07-24 NOTE — Progress Notes (Signed)
PROGRESS NOTE    Holly Moon   ZOX:[id number]  DOB: September 04, 1970  DOA: 07/07/2019 PCP: [name], PA   Brief Narrative:  Holly Moon 48 year old morbidly obese female with essential hypertension, fibromyalgia, history of PE off anticoagulation, alcohol and tobacco use who is admitted to surgical service with complicated acute sigmoid diverticulitis with developing fluid collection and abscess.  Abdominal CT from 11/18 showed diffuse colon diverticulosis with worsening inflammation around sigmoid colon and increased pneumoperitoneum with perihepatic ascites.  Repeat CT on 11/23 with possible abscesses.  Patient on empiric IV Zosyn and IR consulted for image guided drainage.  Hospitalist consulted for intermittent fever with new bilateral upper lobe infiltrate with effusion, increasing leukocytosis.  On 11/23 rapid response was called due to fever of 103F with tachycardia and tachypnea.  CT angiogram of the chest was negative for PE but showed extensive patchy bilateral pulmonary infiltrate in the upper lobes with small bilateral pleural effusion (L >R) reportedly patient had history of Covid in the family about 1 week prior to hospitalization.  Covid PCR test were checked on admission on 11/14.  Given her acute respiratory symptoms COVID-19 was tested again and came back positive.  Along with that she was found to have elevated D-dimer of 9.1, CRP of 30, fibrinogen of 700 and normal ferritin.  Patient placed on airborne and contact precautions and started on empiric Decadron and remdesivir.   Subjective: She has no new complaints. Continues to have a cough with mild congestion. Has dypnea when walking.    Assessment & Plan:   Principal Problem: Perforation of sigmoid colon due to diverticulitis -11/25-percutaneous drains placed  -cultures are growing E. coli  -She continues to have output from her drain and therefore it has not yet been removed -Diet has been  advanced to soft food and TPN discontinued- General surgery feels she is ready to go home with the drains- she will need to learn how to flush her drains today and measure output - I will order home health for her as well  Active Problems:    COVID-19 virus infection with pneumonia- acute respiratory failure -She has been treated with Decadron and remdesivir and is recuperating well from this-she has residual mild dry cough and dyspnea- added Mucinex-  ambulatory pulse ox is still in 80s today- I have ordered home O2  Severe sepsis -Secondary to above-mentioned infections which are now coming under control  Sinus bradycardia She is asymptomatic from this  Essential hypertension -Continue Lotensin  Morbid obesity -Body mass index is 48.79 kg/m.  Tobacco abuse -Patient stated that she stopped smoking about 10 days prior to admission  Anxiety/depression - cont home meds  Time spent in minutes: 35 DVT prophylaxis: Lovenox Code Status: Full code Family Communication: None Disposition Plan: Home when stable Consultants:   General surgery  IR Procedures:   Transcutaneous intra-abdominal Antimicrobials:  Anti-infectives (From admission, onward)   Start     Dose/Rate Route Frequency Ordered Stop   07/24/19 1300  amoxicillin-clavulanate (AUGMENTIN) 875-125 MG per tablet 1 tablet     1 tablet Oral Every 12 hours 07/24/19 1131     07/19/19 1000  remdesivir 100 mg in sodium chloride 0.9 % 250 mL IVPB     100 mg 500 mL/hr over 30 Minutes Intravenous Daily 07/17/19 1822 07/21/19 1155   07/18/19 1600  remdesivir 100 mg in sodium chloride 0.9 % 250 mL IVPB     100 mg 500 mL/hr over 30 Minutes Intravenous  Once 07/17/19  1822 07/18/19 1615   07/18/19 0600  vancomycin (VANCOCIN) 1,250 mg in sodium chloride 0.9 % 250 mL IVPB  Status:  Discontinued     1,250 mg 166.7 mL/hr over 90 Minutes Intravenous Every 12 hours 07/17/19 1055 07/19/19 1319   07/17/19 1830  remdesivir 200 mg in  sodium chloride 0.9 % 250 mL IVPB     200 mg 500 mL/hr over 30 Minutes Intravenous Once 07/17/19 1822 07/17/19 2314   07/17/19 1100  vancomycin (VANCOCIN) 2,500 mg in sodium chloride 0.9 % 500 mL IVPB     2,500 mg 250 mL/hr over 120 Minutes Intravenous  Once 07/17/19 1004 07/17/19 2037   07/08/19 0600  piperacillin-tazobactam (ZOSYN) IVPB 3.375 g  Status:  Discontinued     3.375 g 12.5 mL/hr over 240 Minutes Intravenous Every 8 hours 07/08/19 0129 07/24/19 1131   07/07/19 2315  piperacillin-tazobactam (ZOSYN) IVPB 3.375 g     3.375 g 100 mL/hr over 30 Minutes Intravenous  Once 07/07/19 2314 07/07/19 2356       Objective: Vitals:   07/23/19 1421 07/23/19 2012 07/24/19 0526 07/24/19 1422  BP: (!) 143/84 120/65 (!) 141/64 (!) 143/86  Pulse: 82 99 91 93  Resp: Temp: 97.9 F (36.6 C) 98.1 F (36.7 C) 99.1 F (37.3 C) (!) 97.5 F (36.4 C)  TempSrc: Oral Oral Oral Oral  SpO2: 95% 93% 94% 95%  Weight:      Height:        Intake/Output Summary (Last 24 hours) at 07/24/2019 1458 Last data filed at 07/24/2019 1248 Gross per 24 hour  Intake 1284.89 ml  Output 1155 ml  Net 129.89 ml   Filed Weights   07/07/19 1900  Weight: 133 kg    Examination: General exam: Appears comfortable  HEENT: PERRLA, oral mucosa moist, no sclera icterus or thrush Respiratory system: Clear to auscultation. Respiratory effort normal. Cardiovascular system: S1 & S2 heard,  No murmurs  Gastrointestinal system: Abdomen soft, tender across lower abdomen, nondistended. Normal bowel sounds  - drains present with yellow discharge Central nervous system: Alert and oriented. No focal neurological deficits. Extremities: No cyanosis, clubbing or edema Skin: No rashes or ulcers Psychiatry:  Mood & affect appropriate.   Data Reviewed: I have personally reviewed following labs and imaging studies  CBC: Recent Labs  Lab 07/18/19 0449 07/19/19 0416 07/20/19 0510 07/21/19 0437 07/22/19 0500  07/23/19 0425  WBC 12.2* 8.1 6.3 7.6 7.1  --   NEUTROABS 11.0* 7.2 5.0 6.4 5.4 6.8  HGB 10.6* 10.4* 11.0* 10.9* 11.5*  --   HCT 32.8* 33.4* 33.9* 35.2* 37.0  --   MCV 86.1 86.5 87.8 87.6 86.4  --   PLT 372 457* 486* 593* 707*  --    Basic Metabolic Panel: Recent Labs  Lab 07/18/19 0449 07/19/19 0416 07/20/19 0510 07/21/19 0437 07/22/19 0500 07/23/19 0425 07/24/19 0451  NA 136 133* 136 137 137 136 136  K 3.8 4.3 4.3 4.3 4.2 4.4 4.5  CL 105 105 105 104 102 99 100  CO2 GLUCOSE 151* 152* 146* 169* 135* 133* 129*  BUN 23* 26*  CREATININE 0.58 0.52 0.57 0.63 0.57 0.66 0.82  CALCIUM 7.6* 7.3* 8.2* 8.7* 9.0 9.1 9.2  MG 2.2 2.1  --  2.1  --  2.0  --   PHOS 3.3 2.6 3.0 3.9  --  4.9*  --    GFR: Estimated  Creatinine Clearance: 115.8 mL/min (by C-G formula based on SCr of 0.82 mg/dL). Liver Function Tests: Recent Labs  Lab 07/18/19 0449 07/19/19 0416 07/23/19 0425  AST 30 48* 23  ALT 16 23 27   ALKPHOS 79 74 88  BILITOT 0.6 0.7 0.2*  PROT 6.6 6.3* 7.3  ALBUMIN 2.2* 2.1* 2.5*   No results for input(s): LIPASE, AMYLASE in the last 168 hours. No results for input(s): AMMONIA in the last 168 hours. Coagulation Profile: Recent Labs  Lab 07/17/19 1601 07/18/19 0449  INR 1.5* 1.5*   Cardiac Enzymes: No results for input(s): CKTOTAL, CKMB, CKMBINDEX, TROPONINI in the last 168 hours. BNP (last 3 results) No results for input(s): PROBNP in the last 8760 hours. HbA1C: Recent Labs    07/23/19 0425  HGBA1C 6.1*   CBG: Recent Labs  Lab 07/23/19 1215 07/23/19 1646 07/23/19 2015 07/24/19 0748 07/24/19 1229  GLUCAP 157* 144* 163* 107* 106*   Lipid Profile: Recent Labs    07/23/19 0425  TRIG 208*   Thyroid Function Tests: No results for input(s): TSH, T4TOTAL, FREET4, T3FREE, THYROIDAB in the last 72 hours. Anemia Panel: Recent Labs    07/22/19 0500  FERRITIN 414*   Urine analysis:    Component Value Date/Time    COLORURINE AMBER (A) 07/07/2019 2210   APPEARANCEUR CLOUDY (A) 07/07/2019 2210   LABSPEC 1.024 07/07/2019 2210   PHURINE 5.0 07/07/2019 2210   GLUCOSEU NEGATIVE 07/07/2019 2210   HGBUR NEGATIVE 07/07/2019 2210   BILIRUBINUR NEGATIVE 07/07/2019 2210   KETONESUR NEGATIVE 07/07/2019 2210   PROTEINUR 30 (A) 07/07/2019 2210   NITRITE NEGATIVE 07/07/2019 2210   LEUKOCYTESUR NEGATIVE 07/07/2019 2210   Sepsis Labs: @LABRCNTIP (procalcitonin:4,lacticidven:4) ) Recent Results (from the past 240 hour(s))  SARS CORONAVIRUS 2 (TAT 6-24 HRS) Nasopharyngeal Nasopharyngeal Swab     Status: Abnormal   Collection Time: 07/17/19  8:14 AM   Specimen: Nasopharyngeal Swab  Result Value Ref Range Status   SARS Coronavirus 2 POSITIVE (A) NEGATIVE Final    Comment: RESULT CALLED TO, READ BACK BY AND VERIFIED WITH: RN 15:15 07/17/19 ([email or web address]) (NOTE) SARS-CoV-2 target nucleic acids are DETECTED. The SARS-CoV-2 RNA is generally detectable in upper and lower respiratory specimens during the acute phase of infection. Positive results are indicative of the presence of SARS-CoV-2 RNA. Clinical correlation with patient history and other diagnostic information is  necessary to determine patient infection status. Positive results do not rule out bacterial infection or co-infection with other viruses.  The expected result is Negative. Fact Sheet for Patients: Cory Munch Fact Sheet for Healthcare Providers: 07/19/19 This test is not yet approved or cleared by the [place] FDA and  has been authorized for detection and/or diagnosis of SARS-CoV-2 by FDA under an Emergency Use Authorization (EUA). This EUA will remain  in effect (meaning this test can be used) for t he duration of the COVID-19 declaration under Section 564(b)(1) of the Act, 21 U.S.C. section 360bbb-3(b)(1), unless the authorization is terminated or revoked sooner.  Performed at [facility], [address], [place], [address] [place]   Culture, blood (routine x 2)     Status: None   Collection Time: 07/17/19 10:34 AM   Specimen: BLOOD  Result Value Ref Range Status   Specimen Description   Final    BLOOD RIGHT ARM Performed at [facility], [address] [place], [place]    Special Requests   Final    BOTTLES DRAWN AEROBIC ONLY Blood Culture  adequate volume Performed at [facility], [address] [place], [place] [address]    Culture   Final    NO GROWTH 5 DAYS Performed at [facility], [address] [place], [place] [address]    Report Status 07/22/2019 FINAL  Final  Culture, blood (routine x 2)     Status: None   Collection Time: 07/17/19  4:00 PM   Specimen: BLOOD  Result Value Ref Range Status   Specimen Description   Final    BLOOD RIGHT ANTECUBITAL Performed at [facility], [address] [place], [place] [address]    Special Requests   Final    BOTTLES DRAWN AEROBIC ONLY Blood Culture adequate volume Performed at [facility], [address] [place], [place] [address]    Culture   Final    NO GROWTH 5 DAYS Performed at [facility], [address] [place], [place] [address]    Report Status 07/22/2019 FINAL  Final  Aerobic/Anaerobic Culture (surgical/deep wound)     Status: None   Collection Time: 07/18/19 10:59 AM   Specimen: Abscess  Result Value Ref Range Status   Specimen Description   Final    ABSCESS ABDOMEN Performed at [facility], [address] [place], [place] [address]    Special Requests   Final    Normal Performed at [facility], [address] [place], [place] [address]    Gram Stain   Final    ABUNDANT WBC PRESENT,BOTH PMN AND MONONUCLEAR ABUNDANT GRAM POSITIVE COCCI ABUNDANT GRAM NEGATIVE RODS FEW GRAM VARIABLE ROD Performed at [facility], [address] [place], [place] [address]    Culture   Final    ABUNDANT ESCHERICHIA COLI ABUNDANT BACTEROIDES SPECIES NOT FRAGILIS BETA LACTAMASE POSITIVE MODERATE STREPTOCOCCUS GROUP C    Report Status 07/23/2019 FINAL  Final   Organism ID, Bacteria ESCHERICHIA COLI  Final      Susceptibility   Escherichia coli - MIC*    AMPICILLIN 8 SENSITIVE Sensitive     CEFAZOLIN <=4 SENSITIVE Sensitive     CEFEPIME <=1 SENSITIVE Sensitive     CEFTAZIDIME <=1 SENSITIVE Sensitive     CEFTRIAXONE <=1 SENSITIVE Sensitive     CIPROFLOXACIN >=4 RESISTANT Resistant     GENTAMICIN <=1 SENSITIVE Sensitive     IMIPENEM <=0.25 SENSITIVE Sensitive     TRIMETH/SULFA <=20 SENSITIVE Sensitive     AMPICILLIN/SULBACTAM 4 SENSITIVE Sensitive     PIP/TAZO <=4 SENSITIVE Sensitive     Extended ESBL NEGATIVE Sensitive     * ABUNDANT ESCHERICHIA COLI         Radiology Studies: Dg Chest Port 1 View  Result Date: 07/24/2019 CLINICAL DATA:  COVID positive.  Shortness of breath. EXAM: PORTABLE CHEST 1 VIEW COMPARISON:  CT 07/16/2019. FINDINGS: Right PICC line noted with tip over cavoatrial junction. Heart size normal. Diffuse patchy bilateral pulmonary infiltrates are noted. No pleural effusion or pneumothorax. IMPRESSION: 1.  Right PICC line noted with tip in good anatomic position. 2. Diffuse patchy bilateral pulmonary infiltrates in this known COVID-19 positive patient. Electronically Signed   By: Maisie Fushomas  Register   On: 07/24/2019 09:06      Scheduled Meds: . acetaminophen  1,000 mg Oral Q6H  . ALPRAZolam  0.5 mg Oral BID  . amoxicillin-clavulanate  1 tablet Oral Q12H  . benazepril  20 mg Oral Daily  . busPIRone  30 mg Oral BID  .  Chlorhexidine Gluconate Cloth  6 each Topical Daily  . desvenlafaxine  50 mg Oral Daily  . enoxaparin (LOVENOX) injection  60 mg Subcutaneous Q12H  . feeding supplement (PRO-STAT SUGAR FREE 64)  30 mL Oral BID  . gabapentin  600 mg Oral 2 times per day  .  gabapentin  900 mg Oral Q supper  . guaiFENesin  600 mg Oral BID  . insulin aspart  0-9 Units Subcutaneous TID WC  . multivitamin with minerals  1 tablet Oral Daily  . oxyCODONE  5 mg Oral Q6H  . potassium chloride  20 mEq Oral BID  . Ensure Max Protein  11 oz Oral BID  . sodium chloride flush  5 mL Intracatheter Q8H  . topiramate  100 mg Oral Daily   Continuous Infusions: . sodium chloride Stopped (07/24/19 0443)     LOS: 16 days      Debbe Odea, MD [facility] Pager: [email or web address] Password [email or web address] 07/24/2019, 2:58 PM

## 2019-07-24 NOTE — Progress Notes (Signed)
[phone] Patient had desat, as low as 77%, while asleep and oxygen off. Patient aroused easily and was instructed to place oxygen back on. Sat immediately increased into 90's.

## 2019-07-24 NOTE — Progress Notes (Addendum)
Patient ID: Holly Moon, female   DOB: 1971-08-12, 48 y.o.   MRN: [id number]       Subjective: No new complaints.  Eating well.  Doesn't want to get up and mobilize with RN.  Just had O2 removed.  Still only tender in abdomen when she moves around.  ROS: See above, otherwise other systems negative  Objective: Vital signs in last 24 hours: Temp:  [97.9 F (36.6 C)-99.1 F (37.3 C)] 99.1 F (37.3 C) (12/01 0526) Pulse Rate:  [82-99] 91 (12/01 0526) Resp:  [16-20] 20 (12/01 0526) BP: (120-143)/(64-84) 141/64 (12/01 0526) SpO2:  [93 %-95 %] 94 % (12/01 0526) Last BM Date: 07/23/19  Intake/Output from previous day: 11/30 0701 - 12/01 0700 In: 1584.4 [P.O.:1140; I.V.:231.4; IV Piggyback:150] Out: 2240 [Urine:2200; Drains:40] Intake/Output this shift: No intake/output data recorded.  PE: Abd: soft, morbidly obese, +BS, 2 right-sided bulbs with cloudy output.  LLQ gravity bag with feculent output.  Lab Results:  Recent Labs    07/22/19 0500  WBC 7.1  HGB 11.5*  HCT 37.0  PLT 707*   BMET Recent Labs    07/23/19 0425 07/24/19 0451  NA 136 136  K 4.4 4.5  CL 99 100  CO2 26 26  GLUCOSE 133* 129*  BUN 23* 26*  CREATININE 0.66 0.82  CALCIUM 9.1 9.2   PT/INR No results for input(s): LABPROT, INR in the last 72 hours. CMP     Component Value Date/Time   NA 136 07/24/2019 0451   K 4.5 07/24/2019 0451   CL 100 07/24/2019 0451   CO2 26 07/24/2019 0451   GLUCOSE 129 (H) 07/24/2019 0451   BUN 26 (H) 07/24/2019 0451   CREATININE 0.82 07/24/2019 0451   CALCIUM 9.2 07/24/2019 0451   PROT 7.3 07/23/2019 0425   ALBUMIN 2.5 (L) 07/23/2019 0425   AST 23 07/23/2019 0425   ALT 27 07/23/2019 0425   ALKPHOS 88 07/23/2019 0425   BILITOT 0.2 (L) 07/23/2019 0425   GFRNONAA >60 07/24/2019 0451   GFRAA >60 07/24/2019 0451   Lipase     Component Value Date/Time   LIPASE 17 07/07/2019 2057       Studies/Results: Dg Chest Port 1 View  Result Date: 07/24/2019  CLINICAL DATA:  COVID positive.  Shortness of breath. EXAM: PORTABLE CHEST 1 VIEW COMPARISON:  CT 07/16/2019. FINDINGS: Right PICC line noted with tip over cavoatrial junction. Heart size normal. Diffuse patchy bilateral pulmonary infiltrates are noted. No pleural effusion or pneumothorax. IMPRESSION: 1.  Right PICC line noted with tip in good anatomic position. 2. Diffuse patchy bilateral pulmonary infiltrates in this known COVID-19 positive patient. Electronically Signed   By: Marcello Moores  Register   On: 07/24/2019 09:06    Anti-infectives: Anti-infectives (From admission, onward)   Start     Dose/Rate Route Frequency Ordered Stop   07/19/19 1000  remdesivir 100 mg in sodium chloride 0.9 % 250 mL IVPB     100 mg 500 mL/hr over 30 Minutes Intravenous Daily 07/17/19 1822 07/21/19 1155   07/18/19 1600  remdesivir 100 mg in sodium chloride 0.9 % 250 mL IVPB     100 mg 500 mL/hr over 30 Minutes Intravenous  Once 07/17/19 1822 07/18/19 1615   07/18/19 0600  vancomycin (VANCOCIN) 1,250 mg in sodium chloride 0.9 % 250 mL IVPB  Status:  Discontinued     1,250 mg 166.7 mL/hr over 90 Minutes Intravenous Every 12 hours 07/17/19 1055 07/19/19 1319   07/17/19 1830  remdesivir 200  mg in sodium chloride 0.9 % 250 mL IVPB     200 mg 500 mL/hr over 30 Minutes Intravenous Once 07/17/19 1822 07/17/19 2314   07/17/19 1100  vancomycin (VANCOCIN) 2,500 mg in sodium chloride 0.9 % 500 mL IVPB     2,500 mg 250 mL/hr over 120 Minutes Intravenous  Once 07/17/19 1004 07/17/19 2037   07/08/19 0600  piperacillin-tazobactam (ZOSYN) IVPB 3.375 g     3.375 g 12.5 mL/hr over 240 Minutes Intravenous Every 8 hours 07/08/19 0129     07/07/19 2315  piperacillin-tazobactam (ZOSYN) IVPB 3.375 g     3.375 g 100 mL/hr over 30 Minutes Intravenous  Once 07/07/19 2314 07/07/19 2356       Assessment/Plan Covid positive:07/17/2019  (Covidnegative:07/07/2019) Hx IBS/questionable Crohn's disease AKI Hypertension  Fibromyalgia-generalized Back,hip,and leg pain Hx PE-off anticoagulants Tobacco use EtOH use Morbid obesity-BMI 48.79 Extensive bilateral patchy pulmonary infiltrates primarlyupper lobes; small bilateral effusions - Medicine consult Severe malnutrition - prealbumin <5  Acute sigmoid diverticulitis with multiple intra-abdominal fluid collections, 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    -2 bulb with cloudy serous output  -gravity bag with feculent output suggestive of a fistula  -cxs show E coli, resistant only to cipro  -transition off zosyn to oral agumentin  -will need repeat CT scan at some point when drain output decreased.  At this point, likely as an outpatient if she improves and can DC home soon from her COVID  -surgically stable and ready for DC when medically stable from COVID  NLZ:JQBH diet ID: Zosyn 11/14>>12/1, augmentin 12/1 >> DVT: Lovenox Follow-up: Dr. Romie Levee   LOS: 16 days   [name] , [facility] Surgery 07/24/2019, 11:27 AM Please see Amion for pager number during day hours 7:00am-4:30pm  Agree with above. For repeat CT scan - if abscesses better, may be able to get a drain out prior to discharge.  One drain suspicious for fistula.  She seems to have a handle on the drains and discharge plans with her diverticulitis.  Ovidio Kin, MD, [facility] Surgery Office phone:  [phone]

## 2019-07-25 ENCOUNTER — Inpatient Hospital Stay (HOSPITAL_COMMUNITY): Payer: BC Managed Care – PPO

## 2019-07-25 LAB — GLUCOSE, CAPILLARY
Glucose-Capillary: 118 mg/dL — ABNORMAL HIGH (ref 70–99)
Glucose-Capillary: 154 mg/dL — ABNORMAL HIGH (ref 70–99)
Glucose-Capillary: 114 mg/dL — ABNORMAL HIGH (ref 70–99)
Glucose-Capillary: 130 mg/dL — ABNORMAL HIGH (ref 70–99)

## 2019-07-25 LAB — CBC
HCT: 40.8 % (ref 36.0–46.0)
Hemoglobin: 12.5 g/dL (ref 12.0–15.0)
MCH: 27.4 pg (ref 26.0–34.0)
MCHC: 30.6 g/dL (ref 30.0–36.0)
MCV: 89.3 fL (ref 80.0–100.0)
Platelets: 759 10*3/uL — ABNORMAL HIGH (ref 150–400)
RBC: 4.57 MIL/uL (ref 3.87–5.11)
RDW: 16.1 % — ABNORMAL HIGH (ref 11.5–15.5)
WBC: 7.4 10*3/uL (ref 4.0–10.5)
nRBC: 0 % (ref 0.0–0.2)

## 2019-07-25 MED ORDER — IOHEXOL 300 MG/ML  SOLN
100.0000 mL | Freq: Once | INTRAMUSCULAR | Status: AC | PRN
  Administered 2019-07-25: 100 mL via INTRAVENOUS

## 2019-07-25 MED ORDER — ENSURE ENLIVE PO LIQD
237.0000 mL | ORAL | Status: DC
  Administered 2019-07-26: 237 mL via ORAL

## 2019-07-25 NOTE — Progress Notes (Signed)
Nutrition Follow-up  RD working remotely.  DOCUMENTATION CODES:   Morbid obesity  INTERVENTION:  - continue 30 mL Prostat BID, each supplement provides 100 kcal and 15 grams of protein. - will order Ensure Enlive once/day, each supplement provides 350 kcal and 20 grams of protein. - will order Magic Cup with dinner meals, each supplement provides 290 kcal and 9 grams of protein. - continue to encourage PO intakes.  * weigh patient today.   NUTRITION DIAGNOSIS:   Increased nutrient needs related to acute illness as evidenced by estimated needs. -revised, ongoing  GOAL:   Patient will meet greater than or equal to 90% of their needs -progressing  MONITOR:   PO intake, Supplement acceptance, Labs, Weight trends  ASSESSMENT:   48 y.o. female with hx of HTN, IBS, and diverticulitis. She presented to the ED on 11/14 due to abdominal pain which began on 11/13 after eating pizza for dinner. She experienced N/V x1 PTA. She has family members, including her sister, who tested positive for COVID-19. She reported that Rheumatologist told her she may have Crohn's disease, but it is unclear what this is based on.  Significant Events:  11/14- admission 11/23- Soft diet to CLD 11/24- TPN initiation 11/25- 3 IR drains placed 11/27- CLD to FLD 11/28- FLD to Soft diet 11/29- TPN rate decreased to 65 ml/hr 11/30- TPN stopped    Patient has not been weighed since admission (11/14). Patient has been eating 50-100% of meals since advancement to Soft diet. Prostat ordered BID and patient is accepting this supplement ~50% of the time offered. Will order additional nutrition supplements to aid in kcal and protein provision.  Per notes: - acute sigmoid diverticulitis with multiple intra-abdominal fluid collections (thought to be a fistula) - plan to repeat CT prior to d/c - stable for d/c per Surgery note    Labs reviewed; CBG: 118 mg/dl, BUN: 26 mg/dl. Medications reviewed; sliding scale  novolog, daily multivitamin with minerals, 20 mEq Klor-Con BID.    Diet Order:   Diet Order            DIET SOFT Room service appropriate? Yes; Fluid consistency: Thin  Diet effective now              EDUCATION NEEDS:   No education needs have been identified at this time  Skin:  Skin Assessment: Reviewed RN Assessment  Last BM:  12/1  Height:   Ht Readings from Last 1 Encounters:  07/17/19 5\' 5"  (1.651 m)    Weight:   Wt Readings from Last 1 Encounters:  07/07/19 133 kg    Ideal Body Weight:  56.8 kg  BMI:  Body mass index is 48.79 kg/m.  Estimated Nutritional Needs:   Kcal:  1610-9604 kcal  Protein:  100-113 grams  Fluid:  >/= 2 L/day      Jarome Matin, MS, RD, LDN, [facility] Inpatient Clinical Dietitian Pager # [phone] After hours/weekend pager # [phone]

## 2019-07-25 NOTE — Progress Notes (Addendum)
Patient ID: Holly Moon, female   DOB: 04-02-1971, 48 y.o.   MRN: [id number]       Subjective: No new complaints today.  Eating well still  ROS: See above, otherwise other systems negative  Objective: Vital signs in last 24 hours: Temp:  [97.5 F (36.4 C)-99.8 F (37.7 C)] 98.7 F (37.1 C) (12/02 0827) Pulse Rate:  [93-101] 93 (12/02 0827) Resp:  [18-20] 20 (12/02 0827) BP: (123-143)/(73-86) 130/73 (12/02 0827) SpO2:  [91 %-95 %] 91 % (12/02 0827) Last BM Date: 07/24/19  Intake/Output from previous day: 12/01 0701 - 12/02 0700 In: -  Out: 55 [Drains:55] Intake/Output this shift: Total I/O In: -  Out: 1000 [Urine:1000]  PE: Abd: soft, morbidly obese, +BS, all 3 drains still present.  2 right-sided drains still with purulent cloudy output and over 10cc in each.  LLQ drain still with feculent appearing stool.  Lab Results:  Recent Labs    07/25/19 0451  WBC 7.4  HGB 12.5  HCT 40.8  PLT 759*   BMET Recent Labs    07/23/19 0425 07/24/19 0451  NA 136 136  K 4.4 4.5  CL 99 100  CO2 26 26  GLUCOSE 133* 129*  BUN 23* 26*  CREATININE 0.66 0.82  CALCIUM 9.1 9.2   PT/INR No results for input(s): LABPROT, INR in the last 72 hours. CMP     Component Value Date/Time   NA 136 07/24/2019 0451   K 4.5 07/24/2019 0451   CL 100 07/24/2019 0451   CO2 26 07/24/2019 0451   GLUCOSE 129 (H) 07/24/2019 0451   BUN 26 (H) 07/24/2019 0451   CREATININE 0.82 07/24/2019 0451   CALCIUM 9.2 07/24/2019 0451   PROT 7.3 07/23/2019 0425   ALBUMIN 2.5 (L) 07/23/2019 0425   AST 23 07/23/2019 0425   ALT 27 07/23/2019 0425   ALKPHOS 88 07/23/2019 0425   BILITOT 0.2 (L) 07/23/2019 0425   GFRNONAA >60 07/24/2019 0451   GFRAA >60 07/24/2019 0451   Lipase     Component Value Date/Time   LIPASE 17 07/07/2019 2057       Studies/Results: Dg Chest Port 1 View  Result Date: 07/24/2019 CLINICAL DATA:  COVID positive.  Shortness of breath. EXAM: PORTABLE CHEST 1 VIEW  COMPARISON:  CT 07/16/2019. FINDINGS: Right PICC line noted with tip over cavoatrial junction. Heart size normal. Diffuse patchy bilateral pulmonary infiltrates are noted. No pleural effusion or pneumothorax. IMPRESSION: 1.  Right PICC line noted with tip in good anatomic position. 2. Diffuse patchy bilateral pulmonary infiltrates in this known COVID-19 positive patient. Electronically Signed   By: Maisie Fus  Register   On: 07/24/2019 09:06    Anti-infectives: Anti-infectives (From admission, onward)   Start     Dose/Rate Route Frequency Ordered Stop   07/24/19 1300  amoxicillin-clavulanate (AUGMENTIN) 875-125 MG per tablet 1 tablet     1 tablet Oral Every 12 hours 07/24/19 1131     07/24/19 0000  amoxicillin-clavulanate (AUGMENTIN) 875-125 MG tablet     1 tablet Oral Every 12 hours 07/24/19 1517 08/01/19 2359   07/19/19 1000  remdesivir 100 mg in sodium chloride 0.9 % 250 mL IVPB     100 mg 500 mL/hr over 30 Minutes Intravenous Daily 07/17/19 1822 07/21/19 1155   07/18/19 1600  remdesivir 100 mg in sodium chloride 0.9 % 250 mL IVPB     100 mg 500 mL/hr over 30 Minutes Intravenous  Once 07/17/19 1822 07/18/19 1615   07/18/19 0600  vancomycin (VANCOCIN) 1,250 mg in sodium chloride 0.9 % 250 mL IVPB  Status:  Discontinued     1,250 mg 166.7 mL/hr over 90 Minutes Intravenous Every 12 hours 07/17/19 1055 07/19/19 1319   07/17/19 1830  remdesivir 200 mg in sodium chloride 0.9 % 250 mL IVPB     200 mg 500 mL/hr over 30 Minutes Intravenous Once 07/17/19 1822 07/17/19 2314   07/17/19 1100  vancomycin (VANCOCIN) 2,500 mg in sodium chloride 0.9 % 500 mL IVPB     2,500 mg 250 mL/hr over 120 Minutes Intravenous  Once 07/17/19 1004 07/17/19 2037   07/08/19 0600  piperacillin-tazobactam (ZOSYN) IVPB 3.375 g  Status:  Discontinued     3.375 g 12.5 mL/hr over 240 Minutes Intravenous Every 8 hours 07/08/19 0129 07/24/19 1131   07/07/19 2315  piperacillin-tazobactam (ZOSYN) IVPB 3.375 g     3.375 g 100  mL/hr over 30 Minutes Intravenous  Once 07/07/19 2314 07/07/19 2356       Assessment/Plan Covid positive:07/17/2019     (Covidnegative:07/07/2019) Hx IBS/questionable Crohn's disease AKI Hypertension Fibromyalgia-generalized Back,hip,and leg pain Hx PE-off anticoagulants Tobacco use EtOH use Morbid obesity-BMI 48.79 Extensive bilateral patchy pulmonary infiltrates primarlyupper lobes; small bilateral effusions Malnutrition resolved and prealbumin is 25 currently  Acute sigmoid diverticulitiswith multiple intra-abdominal fluid collections, 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  -2 bulb with seropurulent output -gravity bag with feculent output suggestive of a fistula -cxs show E coli, resistant only to cipro -on agumentin and 8 more days sent to pharmacy for her -will defer to IR on which drains they would like flushed and instructing her that. -IR also wanted a repeat CT prior to discharge.  Once this scan is complete she is still surgically stable for DC home. -I have discussed all of this with the patient once again today. -follow up has been arranged with Dr. Marcello Moores as well.   OIN:OMVE diet ID: [id number]>>12/1, augmentin 12/1 >> [facility]:[id number] Follow-up: Dr. Leighton Ruff   LOS: 74 days    Henreitta Cea , [facility] 07/25/2019, 9:25 AM Please see Amion for pager number during day hours 7:00am-4:30pm  Agree with above.  Her CT scan shows improved fluid collections, but none ready to remove drain.  Alphonsa Overall, MD, [facility] Surgery Office phone:  [phone]

## 2019-07-25 NOTE — TOC Progression Note (Signed)
Transition of Care [facility]) - Progression Note    Patient Details  Name: Holly Moon MRN: [id number] Date of Birth: 08-Dec-1970  Transition of Care [facility] CM/SW Contact  Purcell Mouton, RN Phone Number: 07/25/2019, 10:22 AM  Clinical Narrative:    Spoke with pt concerning [place] needs. Checking for [facility] agency. Home O2 from [place], will deliver a tank to transfer home here.  Unable to find [facility] agency to take [employer] related to out of network or no staffing. MD and pt was made aware.       Expected Discharge Plan and Services                                                 Social Determinants of Health (SDOH) Interventions    Readmission Risk Interventions No flowsheet data found.

## 2019-07-25 NOTE — Discharge Summary (Signed)
Physician Discharge Summary  Holly Moon WUJ:[id number] DOB: 1970-11-20 DOA: 07/07/2019  PCP: [name], PA  Admit date: 07/07/2019 Discharge date: 07/26/2019  Admitted From: home Disposition:  home   Recommendations for Outpatient Follow-up:  1. She will need f/u with the drain clinic/ IR for removal of drains 2. F/u ambulatory pulse ox as outpatient to wean oxygen    Discharge Condition:  stable   CODE STATUS:  Full code   Diet recommendation:  Heart healthy Consultations:  General surgery  IR    Discharge Diagnoses:  Principal Problem:     Perforation of sigmoid colon due to diverticulitis Active Problems:   Severe sepsis (HCC)   COVID-19 virus infection   Essential HTN   Tobacco abuse   Morbid obesity   Anxiety/depression   Brief Summary: Holly Moon 48 year old morbidly obese female with essential hypertension, fibromyalgia, history of PE off anticoagulation, alcohol and tobacco use who is admitted to surgical service with complicated acute sigmoid diverticulitis with developing fluid collection and abscess. Abdominal CT from 11/18 showed diffuse colon diverticulosis with worsening inflammation around sigmoid colon and increased pneumoperitoneum with perihepatic ascites. Repeat CT on 11/23 with possible abscesses. Patient on empiric IV Zosyn and IR consulted for image guided drainage.  Hospitalist consulted for intermittent fever with new bilateral upper lobe infiltrate with effusion, increasing leukocytosis. On 11/23 rapid response was called due to fever of 103F with tachycardia and tachypnea. CT angiogram of the chest was negative for PE but showed extensive patchy bilateral pulmonary infiltrate in the upper lobes with small bilateral pleural effusion (L >R) reportedly patient had history of Covid in the family about 1 week prior to hospitalization.  Covid PCR test were checked on admission on 11/14. Given her acute respiratory symptoms  COVID-19 was tested again and came back positive. Along with that she was found to have elevated D-dimer of 9.1, CRP of 30, fibrinogen of 700 and normal ferritin. Patient placed on airborne and contact precautions and started on empiric Decadron and remdesivir.  Hospital Course:  Principal Problem: Perforation of sigmoid colon due to diverticulitis -11/25-percutaneous drains placed  -cultures are growing E. coli  -She continues to have output from her drains  -Diet has been advanced to soft food and TPN has been discontinued-  - she has had a drain exchange today by IR-  she will go home with the drains she currently has per IR and General surgery - she has also been prescribed Augmentin on d/c by General surgery  Active Problems:    COVID-19 virus infection with pneumonia- acute respiratory failure -She has been treated with Decadron and remdesivir and is recuperating well from this-she has residual mild dry cough and dyspnea- added Mucinex -  ambulatory pulse ox is still in 80s and she requires 2 L O2 to keep pulse ox in 90s - I have ordered 2 L O2 for home use  Severe sepsis -Secondary to above-mentioned infections which are now coming under control   Essential hypertension -Continue Lotensin  Morbid obesity -Body mass index is 48.79 kg/m.  Tobacco abuse -Patient stated that she stopped smoking about 10 days prior to admission  Anxiety/depression - cont home meds    Discharge Exam: Vitals:   07/26/19 0524 07/26/19 1442  BP: 119/86 126/70  Pulse: (!) 113 88  Resp: 20 20  Temp: 98.8 F (37.1 C) 98.1 F (36.7 C)  SpO2: 95% 93%   Vitals:   07/25/19 1442 07/25/19 2100 07/26/19 0524 07/26/19 1442  BP: 119/86 137/62 119/86 126/70  Pulse: 84 (!) 103 (!) 113 88  Resp: (!) 24 (!) 23 20 20   Temp: (!) 97.5 F (36.4 C) 98.4 F (36.9 C) 98.8 F (37.1 C) 98.1 F (36.7 C)  TempSrc: Oral Oral Oral Oral  SpO2: 93% 93% 95% 93%  Weight:      Height:         General: Pt is alert, awake, not in acute distress Cardiovascular: RRR, S1/S2 +, no rubs, no gallops Respiratory: CTA bilaterally, no wheezing, no rhonchi Abdominal: Soft, NT, ND, bowel sounds +, 3 drains present in abdomen, incisions from surgery healing, she has mild tenderness in RUQ Extremities: no edema, no cyanosis   Discharge Instructions  Discharge Instructions    Diet - low sodium heart healthy   Complete by: As directed    Increase activity slowly   Complete by: As directed    MyChart COVID-19 home monitoring program   Complete by: Jul 26, 2019    Is the patient willing to use the MyChart Mobile App for home monitoring?: Yes   Temperature monitoring   Complete by: Jul 26, 2019    After how many days would you like to receive a notification of this patient's flowsheet entries?: 1     Allergies as of 07/26/2019   No Known Allergies     Medication List    STOP taking these medications   naproxen 500 MG tablet Commonly known as: NAPROSYN     TAKE these medications   acetaminophen 500 MG tablet Commonly known as: TYLENOL Take 2 tablets (1,000 mg total) by mouth every 6 (six) hours.   ALPRAZolam 0.5 MG tablet Commonly known as: XANAX Take 0.5 mg by mouth 2 (two) times daily as needed.   amoxicillin-clavulanate 875-125 MG tablet Commonly known as: AUGMENTIN Take 1 tablet by mouth every 12 (twelve) hours for 8 days.   aspirin EC 81 MG tablet Take 81 mg by mouth daily. What changed: Another medication with the same name was removed. Continue taking this medication, and follow the directions you see here.   benazepril 20 MG tablet Commonly known as: LOTENSIN Take 20 mg by mouth daily.   bisacodyl 10 MG suppository Commonly known as: Dulcolax Place 1 suppository (10 mg total) rectally as needed for moderate constipation.   bisacodyl 5 MG EC tablet Commonly known as: DULCOLAX Take 2 tablets (10 mg total) by mouth daily as needed for moderate  constipation.   busPIRone 30 MG tablet Commonly known as: BUSPAR Take 1 tablet by mouth 2 (two) times daily.   busPIRone 15 MG tablet Commonly known as: BUSPAR Take 15 mg by mouth 2 (two) times daily.   desvenlafaxine 50 MG 24 hr tablet Commonly known as: PRISTIQ Take 50 mg by mouth daily.   feeding supplement (ENSURE ENLIVE) Liqd Take 237 mLs by mouth daily.   Ensure Max Protein Liqd Take 330 mLs (11 oz total) by mouth 2 (two) times daily.   feeding supplement (PRO-STAT SUGAR FREE 64) Liqd Take 30 mLs by mouth 2 (two) times daily.   gabapentin 300 MG capsule Commonly known as: NEURONTIN   guaiFENesin 600 MG 12 hr tablet Commonly known as: MUCINEX Take 1 tablet (600 mg total) by mouth 2 (two) times daily as needed.   oxyCODONE 5 MG immediate release tablet Commonly known as: Oxy IR/ROXICODONE Take 1 tablet (5 mg total) by mouth every 6 (six) hours.   polyethylene glycol 17 g packet Commonly known as: MIRALAX /  GLYCOLAX Take 17 g by mouth daily.   senna 8.6 MG Tabs tablet Commonly known as: SENOKOT Take 2 tablets (17.2 mg total) by mouth at bedtime as needed for mild constipation.   tiZANidine 4 MG tablet Commonly known as: ZANAFLEX   topiramate 100 MG tablet Commonly known as: TOPAMAX Take 100 mg by mouth daily.   venlafaxine XR 150 MG 24 hr capsule Commonly known as: EFFEXOR-XR Take 1 capsule by mouth daily.            Durable Medical Equipment  (From admission, onward)         Start     Ordered   07/24/19 1252  For home use only DME oxygen  Once    Question Answer Comment  Length of Need 6 Months   Mode or (Route) Nasal cannula   Liters per Minute 2   Frequency Continuous (stationary and portable oxygen unit needed)   Oxygen conserving device Yes   Oxygen delivery system Gas      07/24/19 1251         Follow-up Information    Romie Levee, MD Follow up on 08/27/2019.   Specialty: General Surgery Why: 11:20am, arrive by 10:50am for  paperwork and check in Contact information: [address] [place] [address] [phone]          No Known Allergies   Procedures/Studies:    Ct Angio Chest Pe W Or Wo Contrast  Result Date: 07/16/2019 CLINICAL DATA:  Acute onset of shortness of breath today EXAM: CT ANGIOGRAPHY CHEST WITH CONTRAST TECHNIQUE: Multidetector CT imaging of the chest was performed using the standard protocol during bolus administration of intravenous contrast. Multiplanar CT image reconstructions and MIPs were obtained to evaluate the vascular anatomy. CONTRAST:  80mL OMNIPAQUE IOHEXOL 350 MG/ML SOLN COMPARISON:  Chest x-ray 07/07/2019 FINDINGS: Cardiovascular: Satisfactory opacification of the pulmonary arteries to the segmental level. No evidence of pulmonary embolism. Normal heart size. Tiny pericardial effusion. Mediastinum/Nodes: No enlarged mediastinal, hilar, or axillary lymph nodes. Thyroid gland, trachea, and esophagus demonstrate no significant findings. Lungs/Pleura: Patient has extensive patchy bilateral pulmonary infiltrates primarily in the upper lobes. Small bilateral pleural effusions, slightly greater on the left. Slight atelectasis in the right middle lobe and in the right lower lobe. Upper Abdomen: Free air under the right hemidiaphragm. See CT scan of the abdomen report for further information. Musculoskeletal: No chest wall abnormality. No acute or significant osseous findings. Review of the MIP images confirms the above findings. IMPRESSION: 1. No pulmonary emboli. 2. Extensive patchy bilateral pulmonary infiltrates primarily in the upper lobes. 3. Small bilateral pleural effusions, slightly greater on the left. 4. Free air under the right hemidiaphragm. See CT scan of the abdomen report for further information. Electronically Signed   By: Francene Boyers M.D.   On: 07/16/2019 21:25   Ct Abdomen Pelvis W Contrast  Result Date: 07/25/2019 CLINICAL DATA:  Diverticulitis, drainage  of multiple fluid collections, follow-up, history COVID, hypertension EXAM: CT ABDOMEN AND PELVIS WITH CONTRAST TECHNIQUE: Multidetector CT imaging of the abdomen and pelvis was performed using the standard protocol following bolus administration of intravenous contrast. Sagittal and coronal MPR images reconstructed from axial data set. CONTRAST:  OMNIPAQUE IOHEXOL 300 MG/ML SOLN IV. No oral contrast. COMPARISON:  07/16/2019 FINDINGS: Lower chest: Peribronchovascular infiltrates throughout lower lungs bilaterally consistent with history of COVID pneumonia, improved on RIGHT and worsened on LEFT. Small pericardial effusion. Hepatobiliary: Gallstones throughout gallbladder. Liver unremarkable. Pancreas: Normal appearance Spleen:  Normal appearance Adrenals/Urinary Tract: Adrenal, kidneys, ureters, and bladder normal appearance Stomach/Bowel: Normal appendix. Scattered diverticula of the descending and sigmoid colon. Decreased sigmoid wall thickening versus prior study. Stomach decompressed. Remaining bowel loops unremarkable. Vascular/Lymphatic: Aorta normal caliber. Vascular structures patent. No adenopathy. Reproductive: Unremarkable uterus and ovaries Other: 3 percutaneous pigtail drainage catheters identified, RIGHT perihepatic, RIGHT mid abdomen, anterior RIGHT mid pelvis. Marked interval decrease in abscess collection since previous exam. RIGHT perihepatic collection now measures a maximum of 4.0 x 2.8 cm below the tip of the RIGHT lobe of the liver, markedly decreased. No residual collection surrounding the mid RIGHT abdominal catheter. Minimal residual gas surrounding anterior RIGHT pelvic catheter. Scattered foci of gas within the anterior abdominal wall question related to percutaneous catheters. Air is also seen adjacent to the external oblique fascia at the anterior RIGHT abdominal wall. No fluid collections in the anterior abdominal wall. Small amount of free intraperitoneal air. No hernia.  Musculoskeletal: No acute osseous findings. IMPRESSION: Persistent pulmonary infiltrates consistent with COVID pneumonia, improved on RIGHT and worsened on LEFT versus prior study. Small pericardial effusion. Marked reduction in sizes of 3 intra-abdominal abscess collections post interval percutaneous drainage, with minimal residual identified RIGHT perihepatic, minimal air adjacent to a anterior pelvic, and resolution of collection at RIGHT mid abdominal summary. Scattered gas within subcutaneous soft tissues of the anterior abdominal wall, likely related to recent percutaneous catheter placement though clinical wound management and serial physical exam assessment recommended to exclude developing cellulitis/fasciitis. Small amount of free air likely related to drainage catheters. Distal colonic diverticulosis with decreased sigmoid wall thickening versus prior study. Electronically Signed   By: Lavonia Dana M.D.   On: 07/25/2019 16:04   Ct Abdomen Pelvis W Contrast  Result Date: 07/16/2019 CLINICAL DATA:  Abdominal pain and recurrent fever EXAM: CT ABDOMEN AND PELVIS WITH CONTRAST TECHNIQUE: Multidetector CT imaging of the abdomen and pelvis was performed using the standard protocol following bolus administration of intravenous contrast. CONTRAST:  164mL OMNIPAQUE IOHEXOL 300 MG/ML  SOLN COMPARISON:  07/11/2019 FINDINGS: Lower chest: Bilateral small effusions are noted which have increased somewhat in the interval from the prior exam. Patchy infiltrate is again identified in the lung bases but has increased somewhat from the prior exams particularly in the upper lobes bilaterally. Hepatobiliary: The liver is within normal limits. The gallbladder is well distended with multiple gallstones within. Pancreas: Unremarkable. No pancreatic ductal dilatation or surrounding inflammatory changes. Spleen: Normal in size without focal abnormality. Adrenals/Urinary Tract: Adrenal glands are within normal limits. Kidneys  demonstrate a normal enhancement pattern bilaterally. Delayed images demonstrate normal excretion without obstructive change. The bladder is partially distended. Stomach/Bowel: There again noted changes consistent with diverticular disease throughout the colon. Adjacent to the sigmoid colon however in an area of previous inflammation there is now an air-fluid collection that measures at least 7.8 x 4.3 cm in dimension within air-fluid level within consistent with a pericolonic abscess. Additionally there are findings along the ascending colon suggestive of rupture of the ascending colon with increase in intraperitoneal air. A large air fluid collection is identified in the right mid abdomen which measures at least 13 by 7 cm in dimension best seen on image number 65 of series 2. This lies adjacent to the inflamed ascending colon and is also consistent with a pericolonic abscess. Additionally communicating with this large collection is a third air-fluid collection which lies along the lateral aspect of the right lobe of the liver causing mass effect upon the adjacent liver  measuring 15 x 6.2 cm in greatest dimension. The appendix is within normal limits. The small bowel is also unremarkable. The stomach is within normal limits. Vascular/Lymphatic: No significant vascular findings are present. No enlarged abdominal or pelvic lymph nodes. Reproductive: Uterus and bilateral adnexa are unremarkable. Other: No abdominal wall hernia or abnormality. No abdominopelvic ascites. Musculoskeletal: Degenerative changes of the lumbar spine are seen. IMPRESSION: There are changes consistent with progressive inflammatory change involving the colon primarily within the ascending colon in an area where prior small pericolonic air collections were noted. This results in a large somewhat bilobed air-fluid collection which extends along the lateral aspect of the liver as well as anterior to the ascending colon. It is possible the  collection anterior to the ascending colon is dilatation of the ascending colon although felt to be less likely given the overall appearance. A third collection is noted along the course of the sigmoid colon consistent with increasing pericolonic abscess as described. Bilateral pleural effusions which have increased in the interval from the prior exam. Additionally increasing patchy infiltrate is noted throughout both lungs primarily within the bases although now seen within the upper lobes bilaterally. These results will be called to the ordering clinician or representative by the Radiologist Assistant, and communication documented in the PACS or zVision Dashboard. Electronically Signed   By: Alcide CleverMark  Lukens M.D.   On: 07/16/2019 19:49   Ct Abdomen Pelvis W Contrast  Addendum Date: 07/11/2019   ADDENDUM REPORT: 07/11/2019 20:01 ADDENDUM: Critical Value/emergent results were called by telephone at the time of interpretation on 07/11/2019 at 8:01 pm to providerDR. Romie LeveeALICIA THOMAS , who verbally acknowledged these results. Electronically Signed   By: Charlett NoseKevin  Dover M.D.   On: 07/11/2019 20:01   Result Date: 07/11/2019 CLINICAL DATA:  Abdominal pain EXAM: CT ABDOMEN AND PELVIS WITH CONTRAST TECHNIQUE: Multidetector CT imaging of the abdomen and pelvis was performed using the standard protocol following bolus administration of intravenous contrast. CONTRAST:  100mL OMNIPAQUE IOHEXOL 300 MG/ML SOLN, 30mL OMNIPAQUE IOHEXOL 300 MG/ML SOLN COMPARISON:  07/07/2019 FINDINGS: Lower chest: Trace bilateral pleural effusions. Bibasilar atelectasis. Heart is normal size. Hepatobiliary: Gallstones within the gallbladder. No focal hepatic abnormality. Pancreas: No focal abnormality or ductal dilatation. Spleen: No focal abnormality.  Normal size. Adrenals/Urinary Tract: No adrenal abnormality. No focal renal abnormality. No stones or hydronephrosis. Urinary bladder is unremarkable. Stomach/Bowel: Extensive colonic diverticulosis.  Stranding/inflammation around the sigmoid colon has progressed since prior study. There is also abnormal stranding along the anterior aspect of the ascending colon which is new since prior study. Increasing pneumoperitoneum with locules of gas throughout the peritoneum. Is difficult to determine if the source is the sigmoid colon or the ascending colon. Appendix is normal. Stomach and small bowel decompressed. Vascular/Lymphatic: No evidence of aneurysm or adenopathy. Reproductive: Uterus and adnexa unremarkable.  No mass. Other: Small amount of perihepatic ascites. Musculoskeletal: No acute bony abnormality. IMPRESSION: Diffuse colonic diverticulosis. Worsening inflammation around the sigmoid colon with new inflammation along the anterior wall of the ascending colon. There is increasing pneumoperitoneum throughout the abdomen and pelvis along with new perihepatic ascites. Findings compatible with perforation. It is difficult to determine exact source of free air. The majority of the free intraperitoneal air is centered anterior to the ascending colon and liver suggesting the possibility that the ascending colon is the source. Cholelithiasis. Trace bilateral effusions.  Bibasilar atelectasis. Electronically Signed: By: Charlett NoseKevin  Dover M.D. On: 07/11/2019 19:32   Ct Abdomen Pelvis W Contrast  Result Date: 07/07/2019  CLINICAL DATA:  Abdominal pain diarrhea EXAM: CT ABDOMEN AND PELVIS WITH CONTRAST TECHNIQUE: Multidetector CT imaging of the abdomen and pelvis was performed using the standard protocol following bolus administration of intravenous contrast. CONTRAST:  OMNIPAQUE IOHEXOL 300 MG/ML  SOLN COMPARISON:  None. FINDINGS: Lower chest: Lung bases demonstrate no acute consolidation or effusion. The heart size is within normal limits. Hepatobiliary: No focal hepatic abnormality or biliary dilatation. Increased intraluminal density within the gallbladder. Pancreas: Unremarkable. No pancreatic ductal  dilatation or surrounding inflammatory changes. Spleen: Normal in size without focal abnormality. Adrenals/Urinary Tract: Adrenal glands are unremarkable. Kidneys are normal, without renal calculi, focal lesion, or hydronephrosis. Bladder is unremarkable. Stomach/Bowel: The stomach is nonenlarged. No dilated small bowel. Nondilated appendix. Hyperdense focus at the cecum near the origin of the appendix, possible diverticulum versus small appendicoliths but no evidence for appendiceal inflammatory change. Fluid within the colon. Sigmoid colon diverticular disease with wall thickening and inflammatory change consistent with acute diverticulitis. Multiple foci of extraluminal gas at the inflamed sigmoid colon. Vascular/Lymphatic: Nonaneurysmal aorta. No significantly enlarged lymph nodes. Reproductive: Uterus and bilateral adnexa are unremarkable. Other: Small foci of extraluminal gas within the right lower quadrant adjacent to the inflamed sigmoid colon. Tiny foci of gas within the right upper quadrant and overlying the dome of the liver. Musculoskeletal: No acute or suspicious osseous abnormality. There are degenerative changes. IMPRESSION: 1. Focal inflammatory changes and wall thickening involving the distal sigmoid colon consistent with acute diverticulitis. Multiple small foci of gas adjacent to the inflamed sigmoid colon with small foci of free air adjacent to the liver, consistent with perforation. Negative for organized abscess at this time. 2. Increased intraluminal density within the gallbladder, possible stones or sludge. Critical Value/emergent results were called by telephone at the time of interpretation on 07/07/2019 at 11:12 pm to providerBOWIE TRAN , who verbally acknowledged these results. Electronically Signed   By: Jasmine Pang M.D.   On: 07/07/2019 23:12   Dg Chest Port 1 View  Result Date: 07/24/2019 CLINICAL DATA:  COVID positive.  Shortness of breath. EXAM: PORTABLE CHEST 1 VIEW  COMPARISON:  CT 07/16/2019. FINDINGS: Right PICC line noted with tip over cavoatrial junction. Heart size normal. Diffuse patchy bilateral pulmonary infiltrates are noted. No pleural effusion or pneumothorax. IMPRESSION: 1.  Right PICC line noted with tip in good anatomic position. 2. Diffuse patchy bilateral pulmonary infiltrates in this known COVID-19 positive patient. Electronically Signed   By: Maisie Fus  Register   On: 07/24/2019 09:06   Dg Chest Portable 1 View  Result Date: 07/07/2019 CLINICAL DATA:  Fever, recent COVID exposure. EXAM: PORTABLE CHEST 1 VIEW COMPARISON:  04/24/2013 FINDINGS: The heart size and mediastinal contours are within normal limits. Both lungs are clear. The visualized skeletal structures are unremarkable. IMPRESSION: No active disease. Electronically Signed   By: Charlett Nose M.D.   On: 07/07/2019 19:45   Ct Image Guided Drainage By Percutaneous Catheter  Result Date: 07/18/2019 INDICATION: Colonic perforation of indeterminate source now with multiple intra-abdominal mixed fluid though predominantly air containing collections within the abdomen. Active COVID-19 infection. Please perform CT-guided percutaneous drainage catheter(s) placement(s) for infection source control purposes. EXAM: CT IMAGE GUIDED DRAINAGE BY PERCUTANEOUS CATHETER x2 COMPARISON:  CT abdomen pelvis - 07/11/2019; 07/07/2019 MEDICATIONS: The patient is currently admitted to the hospital and receiving intravenous antibiotics. The antibiotics were administered within an appropriate time frame prior to the initiation of the procedure. ANESTHESIA/SEDATION: Moderate (conscious) sedation was employed during this procedure. A total of Versed  6 mg and Fentanyl 300 mcg was administered intravenously. Moderate Sedation Time: 50 minutes. The patient's level of consciousness and vital signs were monitored continuously by radiology nursing throughout the procedure under my direct supervision. CONTRAST:  None COMPLICATIONS:  None immediate. PROCEDURE: Informed written consent was obtained from the patient after a discussion of the risks, benefits and alternatives to treatment. The patient was placed supine on the CT gantry and a pre procedural CT was performed re-demonstrating the known abscesses/air and fluid collections within the with dominant collection within the right upper abdominal quadrant measuring approximately 14.8 x 6.3 cm (image 50, series 2) dominant predominantly air collection within right lower quadrant measuring approximately 15.5 x 8.3 cm (image 72, series 2) and dominant mixed and air and fluid containing collection within the midline of the lower abdomen/pelvis measuring approximately 9.7 x 5.1 cm (image 97, series 2). The procedure was planned. A timeout was performed prior to the initiation of the procedure. The right upper and mid abdomen as well as the left lower abdomen was was prepped and draped in the usual sterile fashion. The overlying soft tissues were anesthetized with 1% lidocaine with epinephrine. 18 gauge trocar needles were advanced into each fluid collection in short Amplatz wires were coiled within each separate collection. Next, sequentially, each track was serially dilated allowing placement of 12 French percutaneous drainage catheters under intermittent CT fluoroscopic guidance. Completion CT imaging was obtained and drainage catheters were retracted into the dominant component of each air and fluid containing collection. Postprocedural CT imaging was obtained. A total of approximately 45 cc of feculent appearing fluid was aspirated from all 3 drains, with the largest amount aspirated from the left lower quadrant percutaneous drainage catheter. The drainage catheter within the left lower abdominal quadrant was connected to a gravity bag while the other 2 percutaneous drainage catheters were connected to JP bulbs. All drainage catheters were secured at the skin entrance site within interrupted  sutures and Stat Lock devices. Dressings were applied. The patient tolerated the procedure well without immediate postprocedural complication. IMPRESSION: Technically successful CT-guided placement of 3 separate percutaneous drainage catheters into dominant mixed fluid though predominantly air containing collections within the right upper and mid abdomen as well as the left lower abdominal quadrant. Electronically Signed   By: Simonne Come M.D.   On: 07/18/2019 13:05   Ct Image Guided Drainage By Percutaneous Catheter  Result Date: 07/18/2019 INDICATION: Colonic perforation of indeterminate source now with multiple intra-abdominal mixed fluid though predominantly air containing collections within the abdomen. Active COVID-19 infection. Please perform CT-guided percutaneous drainage catheter(s) placement(s) for infection source control purposes. EXAM: CT IMAGE GUIDED DRAINAGE BY PERCUTANEOUS CATHETER x2 COMPARISON:  CT abdomen pelvis - 07/11/2019; 07/07/2019 MEDICATIONS: The patient is currently admitted to the hospital and receiving intravenous antibiotics. The antibiotics were administered within an appropriate time frame prior to the initiation of the procedure. ANESTHESIA/SEDATION: Moderate (conscious) sedation was employed during this procedure. A total of Versed 6 mg and Fentanyl 300 mcg was administered intravenously. Moderate Sedation Time: 50 minutes. The patient's level of consciousness and vital signs were monitored continuously by radiology nursing throughout the procedure under my direct supervision. CONTRAST:  None COMPLICATIONS: None immediate. PROCEDURE: Informed written consent was obtained from the patient after a discussion of the risks, benefits and alternatives to treatment. The patient was placed supine on the CT gantry and a pre procedural CT was performed re-demonstrating the known abscesses/air and fluid collections within the with dominant collection within the right  upper abdominal  quadrant measuring approximately 14.8 x 6.3 cm (image 50, series 2) dominant predominantly air collection within right lower quadrant measuring approximately 15.5 x 8.3 cm (image 72, series 2) and dominant mixed and air and fluid containing collection within the midline of the lower abdomen/pelvis measuring approximately 9.7 x 5.1 cm (image 97, series 2). The procedure was planned. A timeout was performed prior to the initiation of the procedure. The right upper and mid abdomen as well as the left lower abdomen was was prepped and draped in the usual sterile fashion. The overlying soft tissues were anesthetized with 1% lidocaine with epinephrine. 18 gauge trocar needles were advanced into each fluid collection in short Amplatz wires were coiled within each separate collection. Next, sequentially, each track was serially dilated allowing placement of 12 French percutaneous drainage catheters under intermittent CT fluoroscopic guidance. Completion CT imaging was obtained and drainage catheters were retracted into the dominant component of each air and fluid containing collection. Postprocedural CT imaging was obtained. A total of approximately 45 cc of feculent appearing fluid was aspirated from all 3 drains, with the largest amount aspirated from the left lower quadrant percutaneous drainage catheter. The drainage catheter within the left lower abdominal quadrant was connected to a gravity bag while the other 2 percutaneous drainage catheters were connected to JP bulbs. All drainage catheters were secured at the skin entrance site within interrupted sutures and Stat Lock devices. Dressings were applied. The patient tolerated the procedure well without immediate postprocedural complication. IMPRESSION: Technically successful CT-guided placement of 3 separate percutaneous drainage catheters into dominant mixed fluid though predominantly air containing collections within the right upper and mid abdomen as well as the  left lower abdominal quadrant. Electronically Signed   By: Simonne Come M.D.   On: 07/18/2019 13:05   Ct Image Guided Drainage By Percutaneous Catheter  Result Date: 07/18/2019 INDICATION: Colonic perforation of indeterminate source now with multiple intra-abdominal mixed fluid though predominantly air containing collections within the abdomen. Active COVID-19 infection. Please perform CT-guided percutaneous drainage catheter(s) placement(s) for infection source control purposes. EXAM: CT IMAGE GUIDED DRAINAGE BY PERCUTANEOUS CATHETER x2 COMPARISON:  CT abdomen pelvis - 07/11/2019; 07/07/2019 MEDICATIONS: The patient is currently admitted to the hospital and receiving intravenous antibiotics. The antibiotics were administered within an appropriate time frame prior to the initiation of the procedure. ANESTHESIA/SEDATION: Moderate (conscious) sedation was employed during this procedure. A total of Versed 6 mg and Fentanyl 300 mcg was administered intravenously. Moderate Sedation Time: 50 minutes. The patient's level of consciousness and vital signs were monitored continuously by radiology nursing throughout the procedure under my direct supervision. CONTRAST:  None COMPLICATIONS: None immediate. PROCEDURE: Informed written consent was obtained from the patient after a discussion of the risks, benefits and alternatives to treatment. The patient was placed supine on the CT gantry and a pre procedural CT was performed re-demonstrating the known abscesses/air and fluid collections within the with dominant collection within the right upper abdominal quadrant measuring approximately 14.8 x 6.3 cm (image 50, series 2) dominant predominantly air collection within right lower quadrant measuring approximately 15.5 x 8.3 cm (image 72, series 2) and dominant mixed and air and fluid containing collection within the midline of the lower abdomen/pelvis measuring approximately 9.7 x 5.1 cm (image 97, series 2). The procedure was  planned. A timeout was performed prior to the initiation of the procedure. The right upper and mid abdomen as well as the left lower abdomen was was prepped and draped in  the usual sterile fashion. The overlying soft tissues were anesthetized with 1% lidocaine with epinephrine. 18 gauge trocar needles were advanced into each fluid collection in short Amplatz wires were coiled within each separate collection. Next, sequentially, each track was serially dilated allowing placement of 12 French percutaneous drainage catheters under intermittent CT fluoroscopic guidance. Completion CT imaging was obtained and drainage catheters were retracted into the dominant component of each air and fluid containing collection. Postprocedural CT imaging was obtained. A total of approximately 45 cc of feculent appearing fluid was aspirated from all 3 drains, with the largest amount aspirated from the left lower quadrant percutaneous drainage catheter. The drainage catheter within the left lower abdominal quadrant was connected to a gravity bag while the other 2 percutaneous drainage catheters were connected to JP bulbs. All drainage catheters were secured at the skin entrance site within interrupted sutures and Stat Lock devices. Dressings were applied. The patient tolerated the procedure well without immediate postprocedural complication. IMPRESSION: Technically successful CT-guided placement of 3 separate percutaneous drainage catheters into dominant mixed fluid though predominantly air containing collections within the right upper and mid abdomen as well as the left lower abdominal quadrant. Electronically Signed   By: Simonne Come M.D.   On: 07/18/2019 13:05   [place] Ekg Site Rite  Result Date: 07/17/2019 If Site Rite image not attached, placement could not be confirmed due to current cardiac rhythm.    The results of significant diagnostics from this hospitalization (including imaging, microbiology, ancillary and laboratory)  are listed below for reference.     Microbiology: Recent Results (from the past 240 hour(s))  SARS CORONAVIRUS 2 (TAT 6-24 HRS) Nasopharyngeal Nasopharyngeal Swab     Status: Abnormal   Collection Time: 07/17/19  8:14 AM   Specimen: Nasopharyngeal Swab  Result Value Ref Range Status   SARS Coronavirus 2 POSITIVE (A) NEGATIVE Final    Comment: RESULT CALLED TO, READ BACK BY AND VERIFIED WITH: Cory Munch RN 15:15 07/17/19 ([email or web address]) (NOTE) SARS-CoV-2 target nucleic acids are DETECTED. The SARS-CoV-2 RNA is generally detectable in upper and lower respiratory specimens during the acute phase of infection. Positive results are indicative of the presence of SARS-CoV-2 RNA. Clinical correlation with patient history and other diagnostic information is  necessary to determine patient infection status. Positive results do not rule out bacterial infection or co-infection with other viruses.  The expected result is Negative. Fact Sheet for Patients: HairSlick.no Fact Sheet for Healthcare Providers: quierodirigir.com This test is not yet approved or cleared by the [place] FDA and  has been authorized for detection and/or diagnosis of SARS-CoV-2 by FDA under an Emergency Use Authorization (EUA). This EUA will remain  in effect (meaning this test can be used) for t he duration of the COVID-19 declaration under Section 564(b)(1) of the Act, 21 U.S.C. section 360bbb-3(b)(1), unless the authorization is terminated or revoked sooner. Performed at [facility], [address], [place], [place] [address]   Culture, blood (routine x 2)     Status: None   Collection Time: 07/17/19 10:34 AM   Specimen: BLOOD  Result Value Ref Range Status   Specimen Description   Final    BLOOD RIGHT ARM Performed at [facility], [address] [place], [place] [address]    Special Requests   Final    BOTTLES DRAWN AEROBIC ONLY  Blood Culture adequate volume Performed at [facility], [address] [place], [place] [address]    Culture  Final    NO GROWTH 5 DAYS Performed at [facility], [address] [place], [place] [address]    Report Status 07/22/2019 FINAL  Final  Culture, blood (routine x 2)     Status: None   Collection Time: 07/17/19  4:00 PM   Specimen: BLOOD  Result Value Ref Range Status   Specimen Description   Final    BLOOD RIGHT ANTECUBITAL Performed at [facility], [address] [place], [place] [address]    Special Requests   Final    BOTTLES DRAWN AEROBIC ONLY Blood Culture adequate volume Performed at [facility], [address], [place], [place] [address]    Culture   Final    NO GROWTH 5 DAYS Performed at [facility], [address], [place], [place] [address]    Report Status 07/22/2019 FINAL  Final  Aerobic/Anaerobic Culture (surgical/deep wound)     Status: None   Collection Time: 07/18/19 10:59 AM   Specimen: Abscess  Result Value Ref Range Status   Specimen Description   Final    ABSCESS ABDOMEN Performed at [facility], [address] [place], [place] [address]    Special Requests   Final    Normal Performed at [facility], [address], [place], [place] [address]    Gram Stain   Final    ABUNDANT WBC PRESENT,BOTH PMN AND MONONUCLEAR ABUNDANT GRAM POSITIVE COCCI ABUNDANT GRAM NEGATIVE RODS FEW GRAM VARIABLE ROD Performed at [facility], [address], [place], [place] [address]    Culture   Final    ABUNDANT ESCHERICHIA COLI ABUNDANT BACTEROIDES SPECIES NOT FRAGILIS BETA LACTAMASE POSITIVE MODERATE STREPTOCOCCUS GROUP C    Report Status 07/23/2019 FINAL  Final   Organism ID, Bacteria ESCHERICHIA COLI  Final      Susceptibility   Escherichia coli - MIC*    AMPICILLIN 8 SENSITIVE Sensitive     CEFAZOLIN <=4 SENSITIVE Sensitive      CEFEPIME <=1 SENSITIVE Sensitive     CEFTAZIDIME <=1 SENSITIVE Sensitive     CEFTRIAXONE <=1 SENSITIVE Sensitive     CIPROFLOXACIN >=4 RESISTANT Resistant     GENTAMICIN <=1 SENSITIVE Sensitive     IMIPENEM <=0.25 SENSITIVE Sensitive     TRIMETH/SULFA <=20 SENSITIVE Sensitive     AMPICILLIN/SULBACTAM 4 SENSITIVE Sensitive     PIP/TAZO <=4 SENSITIVE Sensitive     Extended ESBL NEGATIVE Sensitive     * ABUNDANT ESCHERICHIA COLI     Labs: BNP (last 3 results) No results for input(s): BNP in the last 8760 hours. Basic Metabolic Panel: Recent Labs  Lab 07/20/19 0510 07/21/19 0437 07/22/19 0500 07/23/19 0425 07/24/19 0451  NA 136 137 137 136 136  K 4.3 4.3 4.2 4.4 4.5  CL 105 104 102 99 100  CO2 GLUCOSE 146* 169* 135* 133* 129*  BUN 23* 26*  CREATININE 0.57 0.63 0.57 0.66 0.82  CALCIUM 8.2* 8.7* 9.0 9.1 9.2  MG  --  2.1  --  2.0  --   PHOS 3.0 3.9  --  4.9*  --    Liver Function Tests: Recent Labs  Lab 07/23/19 0425  AST 23  ALT 27  ALKPHOS 88  BILITOT 0.2*  PROT 7.3  ALBUMIN 2.5*   No results for input(s): LIPASE, AMYLASE in the last 168 hours. No results for input(s): AMMONIA in the last 168  hours. CBC: Recent Labs  Lab 07/20/19 0510 07/21/19 0437 07/22/19 0500 07/23/19 0425 07/25/19 0451  WBC 6.3 7.6 7.1  --  7.4  NEUTROABS 5.0 6.4 5.4 6.8  --   HGB 11.0* 10.9* 11.5*  --  12.5  HCT 33.9* 35.2* 37.0  --  40.8  MCV 87.8 87.6 86.4  --  89.3  PLT 486* 593* 707*  --  759*   Cardiac Enzymes: No results for input(s): CKTOTAL, CKMB, CKMBINDEX, TROPONINI in the last 168 hours. BNP: Invalid input(s): POCBNP CBG: Recent Labs  Lab 07/24/19 2318 07/25/19 0934 07/25/19 1438 07/25/19 1613 07/25/19 2056  GLUCAP 128* 118* 154* 114* 130*   D-Dimer No results for input(s): DDIMER in the last 72 hours. Hgb A1c No results for input(s): HGBA1C in the last 72 hours. Lipid Profile No results for input(s): CHOL, HDL, LDLCALC, TRIG,  CHOLHDL, LDLDIRECT in the last 72 hours. Thyroid function studies No results for input(s): TSH, T4TOTAL, T3FREE, THYROIDAB in the last 72 hours.  Invalid input(s): FREET3 Anemia work up No results for input(s): VITAMINB12, FOLATE, FERRITIN, TIBC, IRON, RETICCTPCT in the last 72 hours. Urinalysis    Component Value Date/Time   COLORURINE AMBER (A) 07/07/2019 2210   APPEARANCEUR CLOUDY (A) 07/07/2019 2210   LABSPEC 1.024 07/07/2019 2210   PHURINE 5.0 07/07/2019 2210   GLUCOSEU NEGATIVE 07/07/2019 2210   HGBUR NEGATIVE 07/07/2019 2210   BILIRUBINUR NEGATIVE 07/07/2019 2210   KETONESUR NEGATIVE 07/07/2019 2210   PROTEINUR 30 (A) 07/07/2019 2210   NITRITE NEGATIVE 07/07/2019 2210   LEUKOCYTESUR NEGATIVE 07/07/2019 2210   Sepsis Labs Invalid input(s): PROCALCITONIN,  WBC,  LACTICIDVEN Microbiology Recent Results (from the past 240 hour(s))  SARS CORONAVIRUS 2 (TAT 6-24 HRS) Nasopharyngeal Nasopharyngeal Swab     Status: Abnormal   Collection Time: 07/17/19  8:14 AM   Specimen: Nasopharyngeal Swab  Result Value Ref Range Status   SARS Coronavirus 2 POSITIVE (A) NEGATIVE Final    Comment: RESULT CALLED TO, READ BACK BY AND VERIFIED WITH: Cory Munch RN 15:15 07/17/19 ([email or web address]) (NOTE) SARS-CoV-2 target nucleic acids are DETECTED. The SARS-CoV-2 RNA is generally detectable in upper and lower respiratory specimens during the acute phase of infection. Positive results are indicative of the presence of SARS-CoV-2 RNA. Clinical correlation with patient history and other diagnostic information is  necessary to determine patient infection status. Positive results do not rule out bacterial infection or co-infection with other viruses.  The expected result is Negative. Fact Sheet for Patients: HairSlick.no Fact Sheet for Healthcare Providers: quierodirigir.com This test is not yet approved or cleared by the [place] FDA and  has  been authorized for detection and/or diagnosis of SARS-CoV-2 by FDA under an Emergency Use Authorization (EUA). This EUA will remain  in effect (meaning this test can be used) for t he duration of the COVID-19 declaration under Section 564(b)(1) of the Act, 21 U.S.C. section 360bbb-3(b)(1), unless the authorization is terminated or revoked sooner. Performed at [facility], [address], [place], [place] [address]   Culture, blood (routine x 2)     Status: None   Collection Time: 07/17/19 10:34 AM   Specimen: BLOOD  Result Value Ref Range Status   Specimen Description   Final    BLOOD RIGHT ARM Performed at [facility], [address], [place], [place] [address]    Special Requests   Final    BOTTLES DRAWN AEROBIC ONLY Blood Culture adequate volume Performed at [facility],  [address], [place], [place] [address]    Culture   Final    NO GROWTH 5 DAYS Performed at [facility], [address] [place], [place] [address]    Report Status 07/22/2019 FINAL  Final  Culture, blood (routine x 2)     Status: None   Collection Time: 07/17/19  4:00 PM   Specimen: BLOOD  Result Value Ref Range Status   Specimen Description   Final    BLOOD RIGHT ANTECUBITAL Performed at [facility], [address] [place], [place] [address]    Special Requests   Final    BOTTLES DRAWN AEROBIC ONLY Blood Culture adequate volume Performed at [facility], [address] [place], [place] [address]    Culture   Final    NO GROWTH 5 DAYS Performed at [facility], [address] [place], [place] [address]    Report Status 07/22/2019 FINAL  Final  Aerobic/Anaerobic Culture (surgical/deep wound)     Status: None   Collection Time: 07/18/19 10:59 AM   Specimen: Abscess  Result Value Ref Range Status   Specimen Description   Final    ABSCESS ABDOMEN Performed at [facility], [address] [place], [place] [address]    Special Requests   Final    Normal Performed at [facility], [address] [place], [place] [address]    Gram Stain   Final    ABUNDANT WBC PRESENT,BOTH PMN AND MONONUCLEAR ABUNDANT GRAM POSITIVE COCCI ABUNDANT GRAM NEGATIVE RODS FEW GRAM VARIABLE ROD Performed at [facility], [address] [place], [place] [address]    Culture   Final    ABUNDANT ESCHERICHIA COLI ABUNDANT BACTEROIDES SPECIES NOT FRAGILIS BETA LACTAMASE POSITIVE MODERATE STREPTOCOCCUS GROUP C    Report Status 07/23/2019 FINAL  Final   Organism ID, Bacteria ESCHERICHIA COLI  Final      Susceptibility   Escherichia coli - MIC*    AMPICILLIN 8 SENSITIVE Sensitive     CEFAZOLIN <=4 SENSITIVE Sensitive     CEFEPIME <=1 SENSITIVE Sensitive     CEFTAZIDIME <=1 SENSITIVE Sensitive     CEFTRIAXONE <=1 SENSITIVE Sensitive     CIPROFLOXACIN >=4 RESISTANT Resistant     GENTAMICIN <=1 SENSITIVE Sensitive     IMIPENEM <=0.25 SENSITIVE Sensitive     TRIMETH/SULFA <=20 SENSITIVE Sensitive     AMPICILLIN/SULBACTAM 4 SENSITIVE Sensitive     PIP/TAZO <=4 SENSITIVE Sensitive     Extended ESBL NEGATIVE Sensitive     * ABUNDANT ESCHERICHIA COLI     Time coordinating discharge in minutes: 65  SIGNED:   Calvert Cantor, MD  [facility] 07/26/2019, 4:06 PM Pager   If 7PM-7AM, please contact night-coverage [email or web address] Password [email or web address]

## 2019-07-25 NOTE — Progress Notes (Signed)
Patient ID: Holly Moon, female   DOB: 14-Feb-1971, 48 y.o.   MRN: [id number] Patient's latest CT scan of abdomen pelvis reviewed today by Dr. Laurence Ferrari.  Drained fluid collections are improving in size but none are ready for removal yet.  She remains afebrile.  WBC normal, hemoglobin stable.  Drain outputs remain between 10 to 20 cc each with LLQ feculent; cont current tx for now

## 2019-07-25 NOTE — Progress Notes (Signed)
PROGRESS NOTE    Holly Moon   FIE:[id number]  DOB: 1971-01-19  DOA: 07/07/2019 PCP: Harrison Mons, PA   Brief Narrative:  Holly Moon 48 year old morbidly obese female with essential hypertension, fibromyalgia, history of PE off anticoagulation, alcohol and tobacco use who is admitted to surgical service with complicated acute sigmoid diverticulitis with developing fluid collection and abscess.  Abdominal CT from 11/18 showed diffuse colon diverticulosis with worsening inflammation around sigmoid colon and increased pneumoperitoneum with perihepatic ascites.  Repeat CT on 11/23 with possible abscesses.  Patient on empiric IV Zosyn and IR consulted for image guided drainage.  Hospitalist consulted for intermittent fever with new bilateral upper lobe infiltrate with effusion, increasing leukocytosis.  On 11/23 rapid response was called due to fever of 103F with tachycardia and tachypnea.  CT angiogram of the chest was negative for PE but showed extensive patchy bilateral pulmonary infiltrate in the upper lobes with small bilateral pleural effusion (L >R) reportedly patient had history of Covid in the family about 1 week prior to hospitalization.  Covid PCR test were checked on admission on 11/14.  Given her acute respiratory symptoms COVID-19 was tested again and came back positive.  Along with that she was found to have elevated D-dimer of 9.1, CRP of 30, fibrinogen of 700 and normal ferritin.  Patient placed on airborne and contact precautions and started on empiric Decadron and remdesivir.   Subjective: She has no new complaints.     Assessment & Plan:   Principal Problem: Perforation of sigmoid colon due to diverticulitis -11/25-percutaneous drains placed  -cultures are growing E. coli  -She continues to have output from her drain and therefore it has not yet been removed -Diet has been advanced to soft food and TPN discontinued- General surgery feels she is ready  to go home with the drains- IR would like a CT scan, however- will follow-   Active Problems:    COVID-19 virus infection with pneumonia- acute respiratory failure -She has been treated with Decadron and remdesivir and is recuperating well from this-she has residual mild dry cough and dyspnea- added Mucinex-  ambulatory pulse ox is still in 80s - - I have ordered home O2  Severe sepsis -Secondary to above-mentioned infections which are now coming under control  Sinus bradycardia She is asymptomatic from this  Essential hypertension -Continue Lotensin  Morbid obesity -Body mass index is 48.79 kg/m.  Tobacco abuse -Patient stated that she stopped smoking about 10 days prior to admission  Anxiety/depression - cont home meds  Time spent in minutes: 35 DVT prophylaxis: Lovenox Code Status: Full code Family Communication: None Disposition Plan: Home when stable Consultants:   General surgery  IR Procedures:   Transcutaneous intra-abdominal Antimicrobials:  Anti-infectives (From admission, onward)   Start     Dose/Rate Route Frequency Ordered Stop   07/24/19 1300  amoxicillin-clavulanate (AUGMENTIN) 875-125 MG per tablet 1 tablet     1 tablet Oral Every 12 hours 07/24/19 1131     07/24/19 0000  amoxicillin-clavulanate (AUGMENTIN) 875-125 MG tablet     1 tablet Oral Every 12 hours 07/24/19 1517 08/01/19 2359   07/19/19 1000  remdesivir 100 mg in sodium chloride 0.9 % 250 mL IVPB     100 mg 500 mL/hr over 30 Minutes Intravenous Daily 07/17/19 1822 07/21/19 1155   07/18/19 1600  remdesivir 100 mg in sodium chloride 0.9 % 250 mL IVPB     100 mg 500 mL/hr over 30 Minutes Intravenous  Once 07/17/19  1822 07/18/19 1615   07/18/19 0600  vancomycin (VANCOCIN) 1,250 mg in sodium chloride 0.9 % 250 mL IVPB  Status:  Discontinued     1,250 mg 166.7 mL/hr over 90 Minutes Intravenous Every 12 hours 07/17/19 1055 07/19/19 1319   07/17/19 1830  remdesivir 200 mg in sodium chloride 0.9  % 250 mL IVPB     200 mg 500 mL/hr over 30 Minutes Intravenous Once 07/17/19 1822 07/17/19 2314   07/17/19 1100  vancomycin (VANCOCIN) 2,500 mg in sodium chloride 0.9 % 500 mL IVPB     2,500 mg 250 mL/hr over 120 Minutes Intravenous  Once 07/17/19 1004 07/17/19 2037   07/08/19 0600  piperacillin-tazobactam (ZOSYN) IVPB 3.375 g  Status:  Discontinued     3.375 g 12.5 mL/hr over 240 Minutes Intravenous Every 8 hours 07/08/19 0129 07/24/19 1131   07/07/19 2315  piperacillin-tazobactam (ZOSYN) IVPB 3.375 g     3.375 g 100 mL/hr over 30 Minutes Intravenous  Once 07/07/19 2314 07/07/19 2356       Objective: Vitals:   07/24/19 1422 07/24/19 2316 07/25/19 0712 07/25/19 0827  BP: (!) 143/86 123/79 126/75 130/73  Pulse: 93 (!) 101 99 93  Resp: 18  18 20   Temp: (!) 97.5 F (36.4 C) 98 F (36.7 C) 99.8 F (37.7 C) 98.7 F (37.1 C)  TempSrc: Oral Oral Oral Oral  SpO2: 95% 94% 95% 91%  Weight:      Height:        Intake/Output Summary (Last 24 hours) at 07/25/2019 1438 Last data filed at 07/25/2019 1252 Gross per 24 hour  Intake -  Output 1085 ml  Net -1085 ml   Filed Weights   07/07/19 1900  Weight: 133 kg    Examination: General exam: Appears comfortable  HEENT: PERRLA, oral mucosa moist, no sclera icterus or thrush Respiratory system: Clear to auscultation. Respiratory effort normal. Cardiovascular system: S1 & S2 heard,  No murmurs  Gastrointestinal system: Abdomen soft,  Tender across lower abdomen, drains present, nondistended. Normal bowel sounds   Central nervous system: Alert and oriented. No focal neurological deficits. Extremities: No cyanosis, clubbing or edema Skin: No rashes or ulcers Psychiatry:  Mood & affect appropriate.    Data Reviewed: I have personally reviewed following labs and imaging studies  CBC: Recent Labs  Lab 07/19/19 0416 07/20/19 0510 07/21/19 0437 07/22/19 0500 07/23/19 0425 07/25/19 0451  WBC 8.1 6.3 7.6 7.1  --  7.4  NEUTROABS 7.2  5.0 6.4 5.4 6.8  --   HGB 10.4* 11.0* 10.9* 11.5*  --  12.5  HCT 33.4* 33.9* 35.2* 37.0  --  40.8  MCV 86.5 87.8 87.6 86.4  --  89.3  PLT 457* 486* 593* 707*  --  759*   Basic Metabolic Panel: Recent Labs  Lab 07/19/19 0416 07/20/19 0510 07/21/19 0437 07/22/19 0500 07/23/19 0425 07/24/19 0451  NA 133* 136 137 137 136 136  K 4.3 4.3 4.3 4.2 4.4 4.5  CL 105 105 104 102 99 100  CO2 22 23 24 26 26 26   GLUCOSE 152* 146* 169* 135* 133* 129*  BUN 16 16 15 20  23* 26*  CREATININE 0.52 0.57 0.63 0.57 0.66 0.82  CALCIUM 7.3* 8.2* 8.7* 9.0 9.1 9.2  MG 2.1  --  2.1  --  2.0  --   PHOS 2.6 3.0 3.9  --  4.9*  --    GFR: Estimated Creatinine Clearance: 115.8 mL/min (by C-G formula based on SCr of 0.82 mg/dL).  Liver Function Tests: Recent Labs  Lab 07/19/19 0416 07/23/19 0425  AST 48* 23  ALT 23 27  ALKPHOS 74 88  BILITOT 0.7 0.2*  PROT 6.3* 7.3  ALBUMIN 2.1* 2.5*   No results for input(s): LIPASE, AMYLASE in the last 168 hours. No results for input(s): AMMONIA in the last 168 hours. Coagulation Profile: No results for input(s): INR, PROTIME in the last 168 hours. Cardiac Enzymes: No results for input(s): CKTOTAL, CKMB, CKMBINDEX, TROPONINI in the last 168 hours. BNP (last 3 results) No results for input(s): PROBNP in the last 8760 hours. HbA1C: Recent Labs    07/23/19 0425  HGBA1C 6.1*   CBG: Recent Labs  Lab 07/24/19 0748 07/24/19 1229 07/24/19 1622 07/24/19 2318 07/25/19 0934  GLUCAP 107* 106* 101* 128* 118*   Lipid Profile: Recent Labs    07/23/19 0425  TRIG 208*   Thyroid Function Tests: No results for input(s): TSH, T4TOTAL, FREET4, T3FREE, THYROIDAB in the last 72 hours. Anemia Panel: No results for input(s): VITAMINB12, FOLATE, FERRITIN, TIBC, IRON, RETICCTPCT in the last 72 hours. Urine analysis:    Component Value Date/Time   COLORURINE AMBER (A) 07/07/2019 2210   APPEARANCEUR CLOUDY (A) 07/07/2019 2210   LABSPEC 1.024 07/07/2019 2210   PHURINE  5.0 07/07/2019 2210   GLUCOSEU NEGATIVE 07/07/2019 2210   HGBUR NEGATIVE 07/07/2019 2210   BILIRUBINUR NEGATIVE 07/07/2019 2210   KETONESUR NEGATIVE 07/07/2019 2210   PROTEINUR 30 (A) 07/07/2019 2210   NITRITE NEGATIVE 07/07/2019 2210   LEUKOCYTESUR NEGATIVE 07/07/2019 2210   Sepsis Labs: (procalcitonin:4,lacticidven:4) ) Recent Results (from the past 240 hour(s))  SARS CORONAVIRUS 2 (TAT 6-24 HRS) Nasopharyngeal Nasopharyngeal Swab     Status: Abnormal   Collection Time: 07/17/19  8:14 AM   Specimen: Nasopharyngeal Swab  Result Value Ref Range Status   SARS Coronavirus 2 POSITIVE (A) NEGATIVE Final    Comment: RESULT CALLED TO, READ BACK BY AND VERIFIED WITH: Cory Munch RN 15:15 07/17/19 ([email or web address]) (NOTE) SARS-CoV-2 target nucleic acids are DETECTED. The SARS-CoV-2 RNA is generally detectable in upper and lower respiratory specimens during the acute phase of infection. Positive results are indicative of the presence of SARS-CoV-2 RNA. Clinical correlation with patient history and other diagnostic information is  necessary to determine patient infection status. Positive results do not rule out bacterial infection or co-infection with other viruses.  The expected result is Negative. Fact Sheet for Patients: HairSlick.no Fact Sheet for Healthcare Providers: quierodirigir.com This test is not yet approved or cleared by the [place] FDA and  has been authorized for detection and/or diagnosis of SARS-CoV-2 by FDA under an Emergency Use Authorization (EUA). This EUA will remain  in effect (meaning this test can be used) for t he duration of the COVID-19 declaration under Section 564(b)(1) of the Act, 21 U.S.C. section 360bbb-3(b)(1), unless the authorization is terminated or revoked sooner. Performed at [facility], [address] [place], [place] [address]   Culture, blood (routine x 2)     Status: None    Collection Time: 07/17/19 10:34 AM   Specimen: BLOOD  Result Value Ref Range Status   Specimen Description   Final    BLOOD RIGHT ARM Performed at [facility], [address] [place], [place] [address]    Special Requests   Final    BOTTLES DRAWN AEROBIC ONLY Blood Culture adequate volume Performed at [facility], [address] [place], [place] [address]    Culture  Final    NO GROWTH 5 DAYS Performed at [facility], [address], [place], [place] [address]    Report Status 07/22/2019 FINAL  Final  Culture, blood (routine x 2)     Status: None   Collection Time: 07/17/19  4:00 PM   Specimen: BLOOD  Result Value Ref Range Status   Specimen Description   Final    BLOOD RIGHT ANTECUBITAL Performed at [facility], [address], [place], [place] [address]    Special Requests   Final    BOTTLES DRAWN AEROBIC ONLY Blood Culture adequate volume Performed at [facility], [address] [place], [place] [address]    Culture   Final    NO GROWTH 5 DAYS Performed at [facility], [address], [place], [place] [address]    Report Status 07/22/2019 FINAL  Final  Aerobic/Anaerobic Culture (surgical/deep wound)     Status: None   Collection Time: 07/18/19 10:59 AM   Specimen: Abscess  Result Value Ref Range Status   Specimen Description   Final    ABSCESS ABDOMEN Performed at [facility], [address], [place], [place] [address]    Special Requests   Final    Normal Performed at [facility] [address], [place], [place] [address]    Gram Stain   Final    ABUNDANT WBC PRESENT,BOTH PMN AND MONONUCLEAR ABUNDANT GRAM POSITIVE COCCI ABUNDANT GRAM NEGATIVE RODS FEW GRAM VARIABLE ROD Performed at [facility], [address], [place], [place] [address]    Culture   Final    ABUNDANT ESCHERICHIA COLI ABUNDANT BACTEROIDES SPECIES  NOT FRAGILIS BETA LACTAMASE POSITIVE MODERATE STREPTOCOCCUS GROUP C    Report Status 07/23/2019 FINAL  Final   Organism ID, Bacteria ESCHERICHIA COLI  Final      Susceptibility   Escherichia coli - MIC*    AMPICILLIN 8 SENSITIVE Sensitive     CEFAZOLIN <=4 SENSITIVE Sensitive     CEFEPIME <=1 SENSITIVE Sensitive     CEFTAZIDIME <=1 SENSITIVE Sensitive     CEFTRIAXONE <=1 SENSITIVE Sensitive     CIPROFLOXACIN >=4 RESISTANT Resistant     GENTAMICIN <=1 SENSITIVE Sensitive     IMIPENEM <=0.25 SENSITIVE Sensitive     TRIMETH/SULFA <=20 SENSITIVE Sensitive     AMPICILLIN/SULBACTAM 4 SENSITIVE Sensitive     PIP/TAZO <=4 SENSITIVE Sensitive     Extended ESBL NEGATIVE Sensitive     * ABUNDANT ESCHERICHIA COLI         Radiology Studies: Dg Chest Port 1 View  Result Date: 07/24/2019 CLINICAL DATA:  COVID positive.  Shortness of breath. EXAM: PORTABLE CHEST 1 VIEW COMPARISON:  CT 07/16/2019. FINDINGS: Right PICC line noted with tip over cavoatrial junction. Heart size normal. Diffuse patchy bilateral pulmonary infiltrates are noted. No pleural effusion or pneumothorax. IMPRESSION: 1.  Right PICC line noted with tip in good anatomic position. 2. Diffuse patchy bilateral pulmonary infiltrates in this known COVID-19 positive patient. Electronically Signed   By: Maisie Fus  Register   On: 07/24/2019 09:06      Scheduled Meds: . acetaminophen  1,000 mg Oral Q6H  . ALPRAZolam  0.5 mg Oral BID  . amoxicillin-clavulanate  1 tablet Oral Q12H  . benazepril  20 mg Oral Daily  . busPIRone  30 mg Oral BID  . Chlorhexidine Gluconate Cloth  6 each Topical Daily  . desvenlafaxine  50 mg Oral Daily  . enoxaparin (  LOVENOX) injection  60 mg Subcutaneous Q12H  . feeding supplement (ENSURE ENLIVE)  237 mL Oral Q24H  . feeding supplement (PRO-STAT SUGAR FREE 64)  30 mL Oral BID  . gabapentin  600 mg Oral 2 times per day  . gabapentin  900 mg Oral Q supper  . guaiFENesin  600 mg Oral BID  . insulin aspart   0-9 Units Subcutaneous TID WC  . multivitamin with minerals  1 tablet Oral Daily  . oxyCODONE  5 mg Oral Q6H  . potassium chloride  20 mEq Oral BID  . Ensure Max Protein  11 oz Oral BID  . sodium chloride flush  5 mL Intracatheter Q8H  . topiramate  100 mg Oral Daily   Continuous Infusions: . sodium chloride Stopped (07/24/19 0443)     LOS: 17 days      Calvert Cantor, MD [facility] Pager: [email or web address] Password [facility] 07/25/2019, 2:38 PM

## 2019-07-26 ENCOUNTER — Inpatient Hospital Stay (HOSPITAL_COMMUNITY): Payer: BC Managed Care – PPO

## 2019-07-26 ENCOUNTER — Encounter (HOSPITAL_COMMUNITY): Payer: Self-pay | Admitting: Diagnostic Radiology

## 2019-07-26 DIAGNOSIS — R188 Other ascites: Secondary | ICD-10-CM

## 2019-07-26 DIAGNOSIS — R0902 Hypoxemia: Secondary | ICD-10-CM

## 2019-07-26 DIAGNOSIS — K651 Peritoneal abscess: Secondary | ICD-10-CM

## 2019-07-26 HISTORY — PX: IR SINUS/FIST TUBE CHK-NON GI: IMG673

## 2019-07-26 LAB — GLUCOSE, CAPILLARY: Glucose-Capillary: 122 mg/dL — ABNORMAL HIGH (ref 70–99)

## 2019-07-26 MED ORDER — ACETAMINOPHEN 500 MG PO TABS: 1000.0000 mg | ORAL_TABLET | Freq: Four times a day (QID) | ORAL | 0 refills | Status: DC

## 2019-07-26 MED ORDER — IOHEXOL 300 MG/ML  SOLN
50.0000 mL | Freq: Once | INTRAMUSCULAR | Status: AC | PRN
  Administered 2019-07-26: 20 mL

## 2019-07-26 MED ORDER — BISACODYL 5 MG PO TBEC: 10.0000 mg | DELAYED_RELEASE_TABLET | Freq: Every day | ORAL | 0 refills | Status: DC | PRN

## 2019-07-26 MED ORDER — POLYETHYLENE GLYCOL 3350 17 G PO PACK: 17.0000 g | PACK | Freq: Every day | ORAL | Status: DC

## 2019-07-26 MED ORDER — SENNA 8.6 MG PO TABS: 2.0000 | ORAL_TABLET | Freq: Every evening | ORAL | 0 refills | Status: DC | PRN

## 2019-07-26 MED ORDER — ENSURE ENLIVE PO LIQD: 237.0000 mL | ORAL | 12 refills | Status: DC

## 2019-07-26 MED ORDER — BISACODYL 10 MG RE SUPP: 10.0000 mg | RECTAL | 0 refills | Status: DC | PRN

## 2019-07-26 MED ORDER — GUAIFENESIN ER 600 MG PO TB12: 600.0000 mg | ORAL_TABLET | Freq: Two times a day (BID) | ORAL | 0 refills | Status: DC | PRN

## 2019-07-26 MED ORDER — ENSURE MAX PROTEIN PO LIQD: 11.0000 [oz_av] | Freq: Two times a day (BID) | ORAL | 30 refills | Status: DC

## 2019-07-26 MED ORDER — BISACODYL 5 MG PO TBEC: 10.0000 mg | DELAYED_RELEASE_TABLET | Freq: Every day | ORAL | Status: DC | PRN

## 2019-07-26 MED ORDER — LIDOCAINE HCL 1 % IJ SOLN
INTRAMUSCULAR | Status: AC
  Filled 2019-07-26: qty 20

## 2019-07-26 MED ORDER — OXYCODONE HCL 5 MG PO TABS: 5.0000 mg | ORAL_TABLET | Freq: Four times a day (QID) | ORAL | 0 refills | Status: DC

## 2019-07-26 MED ORDER — POLYETHYLENE GLYCOL 3350 17 G PO PACK: 17.0000 g | PACK | Freq: Every day | ORAL | 0 refills | Status: DC

## 2019-07-26 MED ORDER — PRO-STAT SUGAR FREE PO LIQD: 30.0000 mL | Freq: Two times a day (BID) | ORAL | 0 refills | Status: DC

## 2019-07-26 NOTE — Discharge Instructions (Signed)
Drain Care: - Flush each drain once daily with 5-10 cc NS flush (patient will need flushes ordered at discharge). - Record output from each drain once daily. - follow up in drain clinic in 10-14 days  You were cared for by a hospitalist during your hospital stay. If you have any questions about your discharge medications or the care you received while you were in the hospital after you are discharged, you can call the unit and asked to speak with the hospitalist on call if the hospitalist that took care of you is not available. Once you are discharged, your primary care physician will handle any further medical issues.   Please note that NO REFILLS for any discharge medications will be authorized once you are discharged, as it is imperative that you return to your primary care physician (or establish a relationship with a primary care physician if you do not have one) for your aftercare needs so that they can reassess your need for medications and monitor your lab values.  Please take all your medications with you for your next visit with your Primary MD. Please ask your Primary MD to get all Hospital records sent to his/her office. Please request your Primary MD to go over all hospital test results at the follow up.   If you experience worsening of your admission symptoms, develop shortness of breath, chest pain, suicidal or homicidal thoughts or a life threatening emergency, you must seek medical attention immediately by calling 911 or calling your MD.   Dennis Bast must read the complete instructions/literature along with all the possible adverse reactions/side effects for all the medicines you take including new medications that have been prescribed to you. Take new medicines after you have completely understood and accpet all the possible adverse reactions/side effects.    Do not drive when taking pain medications or sedatives.     Do not take more than prescribed Pain, Sleep and Anxiety  Medications   If you have smoked or chewed Tobacco in the last 2 yrs please stop. Stop any regular alcohol  and or recreational drug use.   Wear Seat belts while driving.

## 2019-07-26 NOTE — Progress Notes (Addendum)
CT A/P reviewed.  Drains to remain in place per IR.  She is surgically stable for DC home and all follow up and abx therapy has been arranged for the patient.  IR will need to determine and instruct patient on which drains needs to be flushed at home, general drain care, etc.  She will need outpatient follow up with them as well for repeat CT scan and drain clinic appointment.  No further surgical plans this admission.  Likely plan for DC home today.  D/W Dr. Wynelle Cleveland.  Henreitta Cea 8:52 AM 07/26/2019   Agree with above.  Alphonsa Overall, MD, [facility] Office phone:  [phone]

## 2019-07-26 NOTE — Plan of Care (Signed)
  Problem: Clinical Measurements: Goal: Will remain free from infection Outcome: Adequate for Discharge Goal: Diagnostic test results will improve Outcome: Adequate for Discharge   Problem: Activity: Goal: Risk for activity intolerance will decrease Outcome: Adequate for Discharge   Problem: Nutrition: Goal: Adequate nutrition will be maintained Outcome: Adequate for Discharge   Problem: Coping: Goal: Level of anxiety will decrease Outcome: Adequate for Discharge   Problem: Elimination: Goal: Will not experience complications related to bowel motility Outcome: Adequate for Discharge Goal: Will not experience complications related to urinary retention Outcome: Adequate for Discharge   Problem: Pain Managment: Goal: General experience of comfort will improve Outcome: Adequate for Discharge   Problem: Safety: Goal: Ability to remain free from injury will improve Outcome: Adequate for Discharge   Problem: Skin Integrity: Goal: Risk for impaired skin integrity will decrease Outcome: Adequate for Discharge

## 2019-07-26 NOTE — Progress Notes (Signed)
Referring Physician(s): Sherrie George (CC[facility])  Supervising Physician: Richarda Overlie  Patient Status:  [facility] - In-pt  Chief Complaint: "Abdominal pain"  Subjective:  History of diverticulitis with subsequent development of intra-abdominal fluid collections s/p LLQ, RUQ, and right mid hemi abdominal drain placements in IR 07/18/2019 by Dr. Grace Isaac. Patient awake and alert laying in bed. Complains of abdominal pain, stable at this time. LLQ, RUQ, and right mid hemi abdominal drain sites c/d/i.   Allergies: Patient has no known allergies.  Medications: Prior to Admission medications   Medication Sig Start Date End Date Taking? Authorizing Provider  ALPRAZolam Prudy Feeler) 0.5 MG tablet Take 0.5 mg by mouth 2 (two) times daily as needed. 06/30/19  Yes [provider]  aspirin EC 81 MG tablet Take 81 mg by mouth daily.   Yes [provider]  benazepril (LOTENSIN) 20 MG tablet Take 20 mg by mouth daily.   Yes [provider]  busPIRone (BUSPAR) 30 MG tablet Take 1 tablet by mouth 2 (two) times daily. 06/29/19  Yes [provider]  desvenlafaxine (PRISTIQ) 50 MG 24 hr tablet Take 50 mg by mouth daily. 07/04/19  Yes [provider]  gabapentin (NEURONTIN) 300 MG capsule  01/25/18  Yes [provider]  naproxen (NAPROSYN) 500 MG tablet  02/04/18  Yes [provider]  tiZANidine (ZANAFLEX) 4 MG tablet  02/04/18  Yes [provider]  topiramate (TOPAMAX) 100 MG tablet Take 100 mg by mouth daily.  06/29/19  Yes [provider]  venlafaxine XR (EFFEXOR-XR) 150 MG 24 hr capsule Take 1 capsule by mouth daily. 06/29/19  Yes [provider]  acetaminophen (TYLENOL) 500 MG tablet Take 2 tablets (1,000 mg total) by mouth every 6 (six) hours. 07/26/19   Calvert Cantor, MD  Amino Acids-Protein Hydrolys (FEEDING SUPPLEMENT, PRO-STAT SUGAR FREE 64,) LIQD Take 30 mLs by mouth 2 (two) times daily. 07/26/19   Calvert Cantor, MD   amoxicillin-clavulanate (AUGMENTIN) 875-125 MG tablet Take 1 tablet by mouth every 12 (twelve) hours for 8 days. 07/24/19 08/01/19  Barnetta Chapel, PA-C  aspirin EC 325 MG tablet Take 325 mg by mouth daily as needed (headache).    [provider]  bisacodyl (DULCOLAX) 10 MG suppository Place 1 suppository (10 mg total) rectally as needed for moderate constipation. 07/26/19   Calvert Cantor, MD  bisacodyl (DULCOLAX) 5 MG EC tablet Take 2 tablets (10 mg total) by mouth daily as needed for moderate constipation. 07/26/19   Calvert Cantor, MD  busPIRone (BUSPAR) 15 MG tablet Take 15 mg by mouth 2 (two) times daily. 07/06/19   [provider]  Ensure Max Protein (ENSURE MAX PROTEIN) LIQD Take 330 mLs (11 oz total) by mouth 2 (two) times daily. 07/26/19   Calvert Cantor, MD  feeding supplement, ENSURE ENLIVE, (ENSURE ENLIVE) LIQD Take 237 mLs by mouth daily. 07/26/19   Calvert Cantor, MD  guaiFENesin (MUCINEX) 600 MG 12 hr tablet Take 1 tablet (600 mg total) by mouth 2 (two) times daily as needed. 07/26/19   Calvert Cantor, MD  oxyCODONE (OXY IR/ROXICODONE) 5 MG immediate release tablet Take 1 tablet (5 mg total) by mouth every 6 (six) hours. 07/26/19   Calvert Cantor, MD  polyethylene glycol (MIRALAX / GLYCOLAX) 17 g packet Take 17 g by mouth daily. 07/26/19   Calvert Cantor, MD  senna (SENOKOT) 8.6 MG TABS tablet Take 2 tablets (17.2 mg total) by mouth at bedtime as needed for mild constipation. 07/26/19   Calvert Cantor, MD  Vital Signs: BP 119/86 (BP Location: Left Arm)   Pulse (!) 113   Temp 98.8 F (37.1 C) (Oral)   Resp 20   Ht 5\' 5"  (1.651 m)   Wt 293 lb 3.4 oz (133 kg)   LMP 05/27/2019   SpO2 95%   BMI 48.79 kg/m   Physical Exam Vitals signs and nursing note reviewed.  Constitutional:      General: She is not in acute distress.    Appearance: Normal appearance.  Pulmonary:     Effort: Pulmonary effort is normal. No respiratory distress.  Abdominal:     Comments: LLQ, RUQ,  and right mid hemi abdominal drain sites all with mild tenderness, no erythema, drainage, or active bleeding; LLQ drain with fuculent output, RUQ and right mid hemi abdominal drains with serous output.  Skin:    General: Skin is warm and dry.  Neurological:     Mental Status: She is alert and oriented to person, place, and time.     Imaging: Ct Abdomen Pelvis W Contrast  Result Date: 07/25/2019 CLINICAL DATA:  Diverticulitis, drainage of multiple fluid collections, follow-up, history COVID, hypertension EXAM: CT ABDOMEN AND PELVIS WITH CONTRAST TECHNIQUE: Multidetector CT imaging of the abdomen and pelvis was performed using the standard protocol following bolus administration of intravenous contrast. Sagittal and coronal MPR images reconstructed from axial data set. CONTRAST:  100mL OMNIPAQUE IOHEXOL 300 MG/ML SOLN IV. No oral contrast. COMPARISON:  07/16/2019 FINDINGS: Lower chest: Peribronchovascular infiltrates throughout lower lungs bilaterally consistent with history of COVID pneumonia, improved on RIGHT and worsened on LEFT. Small pericardial effusion. Hepatobiliary: Gallstones throughout gallbladder. Liver unremarkable. Pancreas: Normal appearance Spleen: Normal appearance Adrenals/Urinary Tract: Adrenal, kidneys, ureters, and bladder normal appearance Stomach/Bowel: Normal appendix. Scattered diverticula of the descending and sigmoid colon. Decreased sigmoid wall thickening versus prior study. Stomach decompressed. Remaining bowel loops unremarkable. Vascular/Lymphatic: Aorta normal caliber. Vascular structures patent. No adenopathy. Reproductive: Unremarkable uterus and ovaries Other: 3 percutaneous pigtail drainage catheters identified, RIGHT perihepatic, RIGHT mid abdomen, anterior RIGHT mid pelvis. Marked interval decrease in abscess collection since previous exam. RIGHT perihepatic collection now measures a maximum of 4.0 x 2.8 cm below the tip of the RIGHT lobe of the liver, markedly  decreased. No residual collection surrounding the mid RIGHT abdominal catheter. Minimal residual gas surrounding anterior RIGHT pelvic catheter. Scattered foci of gas within the anterior abdominal wall question related to percutaneous catheters. Air is also seen adjacent to the external oblique fascia at the anterior RIGHT abdominal wall. No fluid collections in the anterior abdominal wall. Small amount of free intraperitoneal air. No hernia. Musculoskeletal: No acute osseous findings. IMPRESSION: Persistent pulmonary infiltrates consistent with COVID pneumonia, improved on RIGHT and worsened on LEFT versus prior study. Small pericardial effusion. Marked reduction in sizes of 3 intra-abdominal abscess collections post interval percutaneous drainage, with minimal residual identified RIGHT perihepatic, minimal air adjacent to a anterior pelvic, and resolution of collection at RIGHT mid abdominal summary. Scattered gas within subcutaneous soft tissues of the anterior abdominal wall, likely related to recent percutaneous catheter placement though clinical wound management and serial physical exam assessment recommended to exclude developing cellulitis/fasciitis. Small amount of free air likely related to drainage catheters. Distal colonic diverticulosis with decreased sigmoid wall thickening versus prior study. Electronically Signed   By: Ulyses SouthwardMark  Boles M.D.   On: 07/25/2019 16:04   Dg Chest Port 1 View  Result Date: 07/24/2019 CLINICAL DATA:  COVID positive.  Shortness of breath. EXAM: PORTABLE CHEST 1 VIEW COMPARISON:  CT 07/16/2019. FINDINGS: Right PICC line noted with tip over cavoatrial junction. Heart size normal. Diffuse patchy bilateral pulmonary infiltrates are noted. No pleural effusion or pneumothorax. IMPRESSION: 1.  Right PICC line noted with tip in good anatomic position. 2. Diffuse patchy bilateral pulmonary infiltrates in this known COVID-19 positive patient. Electronically Signed   By: Marcello Moores  Register    On: 07/24/2019 09:06    Labs:  CBC: Recent Labs    07/20/19 0510 07/21/19 0437 07/22/19 0500 07/25/19 0451  WBC 6.3 7.6 7.1 7.4  HGB 11.0* 10.9* 11.5* 12.5  HCT 33.9* 35.2* 37.0 40.8  PLT 486* 593* 707* 759*    COAGS: Recent Labs    07/17/19 1601 07/18/19 0449  INR 1.5* 1.5*    BMP: Recent Labs    07/21/19 0437 07/22/19 0500 07/23/19 0425 07/24/19 0451  NA 137 137 136 136  K 4.3 4.2 4.4 4.5  CL 104 102 99 100  CO2 24 26 26 26   GLUCOSE 169* 135* 133* 129*  BUN 15 20 23* 26*  CALCIUM 8.7* 9.0 9.1 9.2  CREATININE 0.63 0.57 0.66 0.82  GFRNONAA >60 >60 >60 >60  GFRAA >60 >60 >60 >60    LIVER FUNCTION TESTS: Recent Labs    07/17/19 1034 07/18/19 0449 07/19/19 0416 07/23/19 0425  BILITOT 0.7 0.6 0.7 0.2*  AST 23 30 48* 23  ALT 14 16 23 27   ALKPHOS 87 79 74 88  PROT 6.8 6.6 6.3* 7.3  ALBUMIN 2.4* 2.2* 2.1* 2.5*    Assessment and Plan:  History of diverticulitis with subsequent development of intra-abdominal fluid collections s/p LLQ, RUQ, and right mid hemi abdominal drain placements in IR 07/18/2019 by Dr. Pascal Lux. All drains stable. Case discussed with Dr. Anselm Pancoast who recommends repositioning of RUQ drain today. Plan for image-guided intraabdominal drain exchange/maniplulation/replacement today in IR.  If patient is to be discharged, below are discharge instructions: - Flush each drain once daily with 5-10 cc NS flush (patient will need flushes ordered at discharge). - Record output from each drain once daily. - Follow-up at drain clinic 10-14 days after discharge for CT/possible drain injection (assess for possible drain removal)- order placed to facilitate this. RN aware to show patient how to manage drains upon discharge.  Risks and benefits discussed with the patient including bleeding, infection, damage to adjacent structures, bowel perforation/fistula connection, and sepsis. All of the patient's questions were answered, patient is agreeable to  proceed. Verbal consent obtained and in IR control room.   Electronically Signed: Earley Abide, PA-C 07/26/2019, 1:06 PM   I spent a total of 25 Minutes at the the patient's bedside AND on the patient's hospital floor or unit, greater than 50% of which was counseling/coordinating care for intraabdominal fluid collections s/p drain placement x3.

## 2019-07-26 NOTE — Procedures (Signed)
Interventional Radiology Procedure:   Indications: Colonic perforation with intra-abdominal abscesses and percutaneous drains.  Drain in RUQ is retracted.  Procedure: 1)  Drain injections X 3 2) Attempted exchange of RUQ drain - unsuccessful and completely removed.  Findings: RUQ drain was retracted and not adequately positioned.  Could not get a new catheter/drain back into intra-abdominal collection.  Completely removed drain from RUQ.  Persistent gas collection near old RUQ drain.  RLQ and lower midline drain collections are connected.  No evidence for a bowel fistula.  Complications: None     EBL: None  Plan: Attached both remaining drains to suction bulbs.  Recommend flushing each drain with 5 ml sterile saline daily.    Residual RUQ collection is small and would not recommend placing a new drain at this time, hopefully it will resolve with antibiotics.    Holly Moon R. Anselm Pancoast, MD  Pager: [phone]

## 2019-07-27 ENCOUNTER — Other Ambulatory Visit: Payer: Self-pay | Admitting: Surgery

## 2019-07-27 DIAGNOSIS — K5720 Diverticulitis of large intestine with perforation and abscess without bleeding: Secondary | ICD-10-CM

## 2019-08-03 ENCOUNTER — Other Ambulatory Visit: Payer: Self-pay

## 2019-08-03 ENCOUNTER — Encounter (HOSPITAL_COMMUNITY): Payer: Self-pay

## 2019-08-03 ENCOUNTER — Inpatient Hospital Stay (HOSPITAL_COMMUNITY)
Admission: EM | Admit: 2019-08-03 | Discharge: 2019-08-09 | DRG: 856 | Disposition: A | Discharge status: Home / Self Care | Payer: BC Managed Care – PPO | Attending: General Surgery | Admitting: General Surgery

## 2019-08-03 DIAGNOSIS — L03311 Cellulitis of abdominal wall: Secondary | ICD-10-CM | POA: Diagnosis not present

## 2019-08-03 DIAGNOSIS — I10 Essential (primary) hypertension: Secondary | ICD-10-CM | POA: Diagnosis present

## 2019-08-03 DIAGNOSIS — Z6841 Body Mass Index (BMI) 40.0 and over, adult: Secondary | ICD-10-CM

## 2019-08-03 DIAGNOSIS — Z841 Family history of disorders of kidney and ureter: Secondary | ICD-10-CM

## 2019-08-03 DIAGNOSIS — Y828 Other medical devices associated with adverse incidents: Secondary | ICD-10-CM | POA: Diagnosis present

## 2019-08-03 DIAGNOSIS — T8149XA Infection following a procedure, other surgical site, initial encounter: Secondary | ICD-10-CM | POA: Diagnosis not present

## 2019-08-03 DIAGNOSIS — K5780 Diverticulitis of intestine, part unspecified, with perforation and abscess without bleeding: Secondary | ICD-10-CM | POA: Diagnosis present

## 2019-08-03 DIAGNOSIS — Z86711 Personal history of pulmonary embolism: Secondary | ICD-10-CM

## 2019-08-03 DIAGNOSIS — T85698A Other mechanical complication of other specified internal prosthetic devices, implants and grafts, initial encounter: Secondary | ICD-10-CM | POA: Diagnosis present

## 2019-08-03 DIAGNOSIS — K651 Peritoneal abscess: Secondary | ICD-10-CM

## 2019-08-03 DIAGNOSIS — F419 Anxiety disorder, unspecified: Secondary | ICD-10-CM | POA: Diagnosis present

## 2019-08-03 DIAGNOSIS — L0390 Cellulitis, unspecified: Secondary | ICD-10-CM | POA: Diagnosis present

## 2019-08-03 DIAGNOSIS — K632 Fistula of intestine: Secondary | ICD-10-CM | POA: Diagnosis present

## 2019-08-03 DIAGNOSIS — Z79899 Other long term (current) drug therapy: Secondary | ICD-10-CM

## 2019-08-03 DIAGNOSIS — Z7982 Long term (current) use of aspirin: Secondary | ICD-10-CM

## 2019-08-03 DIAGNOSIS — U071 COVID-19: Secondary | ICD-10-CM | POA: Diagnosis present

## 2019-08-03 DIAGNOSIS — F17210 Nicotine dependence, cigarettes, uncomplicated: Secondary | ICD-10-CM | POA: Diagnosis present

## 2019-08-03 NOTE — ED Notes (Signed)
PT also has new onset burring while urinating

## 2019-08-03 NOTE — ED Triage Notes (Signed)
PT has history of perforated bowl Pt has 2 JP drains in place. PT complains of abdominal pain that started at left site after bending over. Pt expresses pain is more severe at left site but does spread to right side. There is drainage noted from site and drainage in drains. Pt state pain new starting today. Test Positive for Covid previously while hospitalized. Currently on 2L o2 at all times. No respiratory complaints today.

## 2019-08-03 NOTE — ED Provider Notes (Signed)
[facility]-EMERGENCY DEPT Provider Note   CSN: [id number] Arrival date & time: 08/03/19  2311     History No chief complaint on file.   Holly Dawn Pollie MeyerMcIntyre is a 48 y.o. female.  Patient presents to the emergency department for evaluation of abdominal pain.  Patient has a recent medical history of perforated diverticulitis with drains in place.  Patient went home from the hospital 1 week ago.  Patient has felt that changed tonight.  She is experiencing increased pain in the left lower abdomen with change in the drainage into the JP drain.        Past Medical History:  Diagnosis Date  . Hypertension 2015  . IBS (irritable bowel syndrome)   . Pulmonary embolism [facility](HCC)     Patient Active Problem List   Diagnosis Date Noted  . Hypoxia   . Intra-abdominal abscess (HCC)   . Intraabdominal fluid collection   . Perforation of sigmoid colon due to diverticulitis 07/21/2019  . COVID-19 virus infection 07/21/2019  . Sinus bradycardia 07/21/2019  . Severe sepsis (HCC) 07/21/2019  . Diverticulitis 07/08/2019  . History of carpal tunnel release 02/17/2018  . History of anxiety 02/17/2018  . History of hypertension 02/17/2018  . DDD (degenerative disc disease), lumbar 02/17/2018  . History of pulmonary embolism 02/17/2018    Past Surgical History:  Procedure Laterality Date  . FRACTURE SURGERY Left 1987   leg fracture  . IR SINUS/FIST TUBE CHK-NON GI  07/26/2019  . RIGHT CARPAL    . WRIST FRACTURE SURGERY       OB History   No obstetric history on file.     Family History  Problem Relation Age of Onset  . Kidney failure Father     Social History   Tobacco Use  . Smoking status: Current Every Day Smoker    Packs/day: 0.05  . Smokeless tobacco: Never Used  Substance Use Topics  . Alcohol use: Yes    Comment: very rarely   . Drug use: No    Home Medications Prior to Admission medications   Medication Sig Start Date End Date Taking?  Authorizing Provider  acetaminophen (TYLENOL) 500 MG tablet Take 2 tablets (1,000 mg total) by mouth every 6 (six) hours. 07/26/19  Yes Calvert Cantorizwan, Saima, MD  ALPRAZolam Prudy Feeler(XANAX) 0.5 MG tablet Take 0.5 mg by mouth 2 (two) times daily as needed for anxiety.  06/30/19  Yes [provider]  Amino Acids-Protein Hydrolys (FEEDING SUPPLEMENT, PRO-STAT SUGAR FREE 64,) LIQD Take 30 mLs by mouth 2 (two) times daily. 07/26/19  Yes Calvert Cantorizwan, Saima, MD  aspirin EC 81 MG tablet Take 81 mg by mouth daily.   Yes [provider]  benazepril (LOTENSIN) 20 MG tablet Take 20 mg by mouth daily.   Yes [provider]  bisacodyl (DULCOLAX) 10 MG suppository Place 1 suppository (10 mg total) rectally as needed for moderate constipation. 07/26/19  Yes Calvert Cantorizwan, Saima, MD  bisacodyl (DULCOLAX) 5 MG EC tablet Take 2 tablets (10 mg total) by mouth daily as needed for moderate constipation. 07/26/19  Yes Calvert Cantorizwan, Saima, MD  busPIRone (BUSPAR) 15 MG tablet Take 15 mg by mouth 2 (two) times daily. 07/06/19  Yes [provider]  busPIRone (BUSPAR) 30 MG tablet Take 1 tablet by mouth 2 (two) times daily. 06/29/19  Yes [provider]  desvenlafaxine (PRISTIQ) 50 MG 24 hr tablet Take 50 mg by mouth daily. 07/04/19  Yes [provider]  Ensure Max Protein (ENSURE MAX PROTEIN)  LIQD Take 330 mLs (11 oz total) by mouth 2 (two) times daily. 07/26/19  Yes Calvert Cantor, MD  feeding supplement, ENSURE ENLIVE, (ENSURE ENLIVE) LIQD Take 237 mLs by mouth daily. 07/26/19  Yes Calvert Cantor, MD  gabapentin (NEURONTIN) 300 MG capsule Take 300 mg by mouth 3 (three) times daily.  01/25/18  Yes [provider]  senna (SENOKOT) 8.6 MG TABS tablet Take 2 tablets (17.2 mg total) by mouth at bedtime as needed for mild constipation. 07/26/19  Yes Calvert Cantor, MD  tiZANidine (ZANAFLEX) 4 MG tablet Take 4 mg by mouth every 8 (eight) hours as needed for muscle spasms.  02/04/18  Yes [provider]    topiramate (TOPAMAX) 100 MG tablet Take 100 mg by mouth daily.  06/29/19  Yes [provider]  venlafaxine XR (EFFEXOR-XR) 150 MG 24 hr capsule Take 1 capsule by mouth daily. 06/29/19  Yes [provider]  Vitamin D, Ergocalciferol, (DRISDOL) 1.25 MG (50000 UT) CAPS capsule Take 50,000 Units by mouth once a week. 07/30/19  Yes [provider]  guaiFENesin (MUCINEX) 600 MG 12 hr tablet Take 1 tablet (600 mg total) by mouth 2 (two) times daily as needed. 07/26/19   Calvert Cantor, MD  oxyCODONE (OXY IR/ROXICODONE) 5 MG immediate release tablet Take 1 tablet (5 mg total) by mouth every 6 (six) hours. 07/26/19   Calvert Cantor, MD  polyethylene glycol (MIRALAX / GLYCOLAX) 17 g packet Take 17 g by mouth daily. 07/26/19   Calvert Cantor, MD    Allergies    Patient has no known allergies.  Review of Systems   Review of Systems  Gastrointestinal: Positive for abdominal pain.  All other systems reviewed and are negative.   Physical Exam Updated Vital Signs BP 135/85   Pulse 95   Temp 98.1 F (36.7 C) (Oral)   Resp 18   LMP 07/11/2019 Comment: not preg  SpO2 94%   Physical Exam Vitals and nursing note reviewed.  Constitutional:      General: She is not in acute distress.    Appearance: Normal appearance. She is well-developed.  HENT:     Head: Normocephalic and atraumatic.     Right Ear: Hearing normal.     Left Ear: Hearing normal.     Nose: Nose normal.  Eyes:     Conjunctiva/sclera: Conjunctivae normal.     Pupils: Pupils are equal, round, and reactive to light.  Cardiovascular:     Rate and Rhythm: Regular rhythm.     Heart sounds: S1 normal and S2 normal. No murmur. No friction rub. No gallop.   Pulmonary:     Effort: Pulmonary effort is normal. No respiratory distress.     Breath sounds: Normal breath sounds.  Chest:     Chest wall: No tenderness.  Abdominal:     General: Bowel sounds are normal.     Palpations: Abdomen is soft.     Tenderness: There  is abdominal tenderness in the left lower quadrant. There is no guarding or rebound. Negative signs include Murphy's sign and McBurney's sign.     Hernia: No hernia is present.  Musculoskeletal:        General: Normal range of motion.     Cervical back: Normal range of motion and neck supple.  Skin:    General: Skin is warm and dry.     Findings: No rash.     Comments: JP drain present right lower quadrant, surrounding skin is clear, no drainage in the  tube or around the tube  P drain present left lower quadrant with surrounding erythema and slight induration with purulent drainage around the tube  Neurological:     Mental Status: She is alert and oriented to person, place, and time.     GCS: GCS eye subscore is 4. GCS verbal subscore is 5. GCS motor subscore is 6.     Cranial Nerves: No cranial nerve deficit.     Sensory: No sensory deficit.     Coordination: Coordination normal.  Psychiatric:        Speech: Speech normal.        Behavior: Behavior normal.        Thought Content: Thought content normal.     ED Results / Procedures / Treatments   Labs (all labs ordered are listed, but only abnormal results are displayed) Labs Reviewed  CBC WITH DIFFERENTIAL/PLATELET - Abnormal; Notable for the following components:      Result Value   WBC 13.5 (*)    Platelets 536 (*)    Neutro Abs 10.0 (*)    Abs Immature Granulocytes 0.12 (*)    All other components within normal limits  BASIC METABOLIC PANEL - Abnormal; Notable for the following components:   Glucose, Bld 116 (*)    BUN 24 (*)    Creatinine, Ser 1.17 (*)    GFR calc non Af Amer 55 (*)    All other components within normal limits  AEROBIC CULTURE (SUPERFICIAL SPECIMEN)  LACTIC ACID, PLASMA    EKG Moon  Radiology CT ABDOMEN PELVIS W CONTRAST  Result Date: 08/04/2019 CLINICAL DATA:  History of diverticulitis with increased left lower quadrant pain. EXAM: CT ABDOMEN AND PELVIS WITH CONTRAST TECHNIQUE: Multidetector  CT imaging of the abdomen and pelvis was performed using the standard protocol following bolus administration of intravenous contrast. CONTRAST:  162mL OMNIPAQUE IOHEXOL 300 MG/ML  SOLN COMPARISON:  07/25/2019 FINDINGS: Lower chest: Patchy infiltrate is again identified within the bases bilaterally but slightly improved consistent with improving COVID pneumonia. No effusion is seen. Hepatobiliary: Gallbladder is well distended. Multiple dependent stones are noted which are poorly calcified. The liver is within normal limits. Pancreas: Unremarkable. No pancreatic ductal dilatation or surrounding inflammatory changes. Spleen: Normal in size without focal abnormality. Adrenals/Urinary Tract: Adrenal glands are within normal limits. The kidneys are well visualized bilaterally. No renal calculi or obstructive changes are seen. The bladder is partially distended. Stomach/Bowel: Diverticular change of the colon is noted. No evidence of diverticulitis. The appendix is within normal limits. Small bowel is unremarkable. Stomach is within normal limits. Vascular/Lymphatic: No significant vascular findings are present. No enlarged abdominal or pelvic lymph nodes. Reproductive: Uterus and bilateral adnexa are unremarkable. Other: There are 2 pigtail drainage catheter is identified within the abdomen. One lies adjacent to the cecum anteriorly with a decompressed collection. The second lies slightly inferior adjacent to loops of sigmoid anteriorly stable in appearance from the prior exam. No residual collection is noted. Scattered areas of subcutaneous air are noted as well as some subcutaneous inflammatory changes surrounding the drainage catheters at both levels. No focal subcutaneous abscess is seen. Air and fluid collection is noted along the inferior liver tip which now measures 6.0 x 1.6 x 6.5 cm. This is slightly increased when compared with the prior exam at which time it measured 4.0 x 2.7 cm. The previously seen drainage  catheter has been removed in the interval. No free fluid is noted within the pelvis. Musculoskeletal: Degenerative changes within the  lumbar spine. IMPRESSION: Two drainage catheters are noted within the abdomen stable from the prior exam without significant residual collections. Mild inflammatory changes are noted surrounding the catheter is in their subcutaneous course without evidence of focal abscess. Increase in the air-fluid collection adjacent to the tip of the liver as described above. Diverticulosis without diverticulitis. Persistent patchy infiltrate in the bases bilaterally although improved from the prior exam consistent with resolving COVID-19 pneumonia. Cholelithiasis without complicating factors. Electronically Signed   By: Alcide Clever M.D.   On: 08/04/2019 03:23    Procedures Procedures (including critical care time)  Medications Ordered in ED Medications  sodium chloride (PF) 0.9 % injection (has no administration in time range)  Ampicillin-Sulbactam (UNASYN) 3 g in sodium chloride 0.9 % 100 mL IVPB (has no administration in time range)  morphine 4 MG/ML injection 4 mg (4 mg Intravenous Given 08/04/19 0114)  ondansetron (ZOFRAN) injection 4 mg (4 mg Intravenous Given 08/04/19 0111)  iohexol (OMNIPAQUE) 300 MG/ML solution 100 mL (100 mLs Intravenous Contrast Given 08/04/19 0236)  morphine 4 MG/ML injection 4 mg (4 mg Intravenous Given 08/04/19 0408)    ED Course  I have reviewed the triage vital signs and the nursing notes.  Pertinent labs & imaging results that were available during my care of the patient were reviewed by me and considered in my medical decision making (see chart for details).    MDM Rules/Calculators/A&P     CHA2DS2/VAS Stroke Risk Points      N/A >= 2 Points: High Risk  1 - 1.99 Points: Medium Risk  0 Points: Low Risk    A final score could not be computed because of missing components.: Last  Change: N/A     This score determines the patient's risk  of having a stroke if the  patient has atrial fibrillation.      This score is not applicable to this patient. Components are not  calculated.                   Patient presents to the emergency department for evaluation of abdominal pain.  Patient has a complex recent medical history.  Patient had a perforated diverticulitis with intra-abdominal abscess formation.  She presents with increased pain in the left lower quadrant.  Patient has a percutaneous drain in the right lower quadrant and left lower quadrant.  Soft tissue surrounding the drain in the left lower quadrant is erythematous, indurated, warm and there appears to be some purulent drainage around the tube.  This is concerning for soft tissue infection.  Patient underwent CT abdomen and pelvis.  The previously seen abscess is at the areas of the 2 existing drains appear to be decompressed and improved.  There was, however, previous fluid collection seen around the liver, presumably where the drain in the right upper abdomen previously was placed.  This collection has significantly enlarged.  As there is no current drain in this area, this is concerning.  Discussed with Dr. Maisie Fus, on-call for surgery.  Patient will be readmitted to the surgical service.  Initiate antibiotic coverage.  Will give Unasyn for intra-abdominal coverage of the E. coli.  Add vancomycin for soft tissue infection seen on abdomen (culture obtained)  CRITICAL CARE Performed by: Gilda Crease   Total critical care time: 35 minutes  Critical care time was exclusive of separately billable procedures and treating other patients.  Critical care was necessary to treat or prevent imminent or life-threatening deterioration.  Critical care was  time spent personally by me on the following activities: development of treatment plan with patient and/or surrogate as well as nursing, discussions with consultants, evaluation of patient's response to treatment, examination  of patient, obtaining history from patient or surrogate, ordering and performing treatments and interventions, ordering and review of laboratory studies, ordering and review of radiographic studies, pulse oximetry and re-evaluation of patient's condition.  Final Clinical Impression(s) / ED Diagnoses Final diagnoses:  Intra-abdominal abscess (HCC)  Cellulitis of abdominal wall    Rx / DC Orders ED Discharge Orders    Moon       Cherisa Brucker, Canary Brim, MD 08/04/19 [id number]

## 2019-08-04 ENCOUNTER — Emergency Department (HOSPITAL_COMMUNITY): Payer: BC Managed Care – PPO

## 2019-08-04 DIAGNOSIS — F419 Anxiety disorder, unspecified: Secondary | ICD-10-CM | POA: Diagnosis present

## 2019-08-04 DIAGNOSIS — Z7982 Long term (current) use of aspirin: Secondary | ICD-10-CM | POA: Diagnosis not present

## 2019-08-04 DIAGNOSIS — Z86711 Personal history of pulmonary embolism: Secondary | ICD-10-CM | POA: Diagnosis not present

## 2019-08-04 DIAGNOSIS — U071 COVID-19: Secondary | ICD-10-CM | POA: Diagnosis present

## 2019-08-04 DIAGNOSIS — Y828 Other medical devices associated with adverse incidents: Secondary | ICD-10-CM | POA: Diagnosis present

## 2019-08-04 DIAGNOSIS — K5780 Diverticulitis of intestine, part unspecified, with perforation and abscess without bleeding: Secondary | ICD-10-CM | POA: Diagnosis present

## 2019-08-04 DIAGNOSIS — F17210 Nicotine dependence, cigarettes, uncomplicated: Secondary | ICD-10-CM | POA: Diagnosis present

## 2019-08-04 DIAGNOSIS — Z6841 Body Mass Index (BMI) 40.0 and over, adult: Secondary | ICD-10-CM | POA: Diagnosis not present

## 2019-08-04 DIAGNOSIS — K651 Peritoneal abscess: Secondary | ICD-10-CM | POA: Diagnosis present

## 2019-08-04 DIAGNOSIS — L03311 Cellulitis of abdominal wall: Secondary | ICD-10-CM | POA: Diagnosis present

## 2019-08-04 DIAGNOSIS — Z841 Family history of disorders of kidney and ureter: Secondary | ICD-10-CM | POA: Diagnosis not present

## 2019-08-04 DIAGNOSIS — L0390 Cellulitis, unspecified: Secondary | ICD-10-CM | POA: Diagnosis present

## 2019-08-04 DIAGNOSIS — K632 Fistula of intestine: Secondary | ICD-10-CM | POA: Diagnosis present

## 2019-08-04 DIAGNOSIS — Z79899 Other long term (current) drug therapy: Secondary | ICD-10-CM | POA: Diagnosis not present

## 2019-08-04 DIAGNOSIS — T8149XA Infection following a procedure, other surgical site, initial encounter: Secondary | ICD-10-CM | POA: Diagnosis present

## 2019-08-04 DIAGNOSIS — T85698A Other mechanical complication of other specified internal prosthetic devices, implants and grafts, initial encounter: Secondary | ICD-10-CM | POA: Diagnosis present

## 2019-08-04 DIAGNOSIS — I10 Essential (primary) hypertension: Secondary | ICD-10-CM | POA: Diagnosis present

## 2019-08-04 LAB — CBC WITH DIFFERENTIAL/PLATELET
Abs Immature Granulocytes: 0.12 10*3/uL — ABNORMAL HIGH (ref 0.00–0.07)
Basophils Absolute: 0.1 10*3/uL (ref 0.0–0.1)
Basophils Relative: 1 %
Eosinophils Absolute: 0.3 10*3/uL (ref 0.0–0.5)
Eosinophils Relative: 2 %
HCT: 44.4 % (ref 36.0–46.0)
Hemoglobin: 13.4 g/dL (ref 12.0–15.0)
Immature Granulocytes: 1 %
Lymphocytes Relative: 17 %
Lymphs Abs: 2.3 10*3/uL (ref 0.7–4.0)
MCH: 27.3 pg (ref 26.0–34.0)
MCHC: 30.2 g/dL (ref 30.0–36.0)
MCV: 90.6 fL (ref 80.0–100.0)
Monocytes Absolute: 0.7 10*3/uL (ref 0.1–1.0)
Monocytes Relative: 5 %
Neutro Abs: 10 10*3/uL — ABNORMAL HIGH (ref 1.7–7.7)
Neutrophils Relative %: 74 %
Platelets: 536 10*3/uL — ABNORMAL HIGH (ref 150–400)
RBC: 4.9 MIL/uL (ref 3.87–5.11)
RDW: 14.9 % (ref 11.5–15.5)
WBC: 13.5 10*3/uL — ABNORMAL HIGH (ref 4.0–10.5)
nRBC: 0 % (ref 0.0–0.2)

## 2019-08-04 LAB — URINALYSIS, ROUTINE W REFLEX MICROSCOPIC
Bilirubin Urine: NEGATIVE
Glucose, UA: NEGATIVE mg/dL
Hgb urine dipstick: NEGATIVE
Ketones, ur: NEGATIVE mg/dL
Leukocytes,Ua: NEGATIVE
Nitrite: NEGATIVE
Protein, ur: NEGATIVE mg/dL
Specific Gravity, Urine: >1.046 — ABNORMAL HIGH (ref 1.005–1.030)
pH: 5 (ref 5.0–8.0)

## 2019-08-04 LAB — BASIC METABOLIC PANEL
Anion gap: 14 (ref 5–15)
BUN: 24 mg/dL — ABNORMAL HIGH (ref 6–20)
CO2: 26 mmol/L (ref 22–32)
Calcium: 9.2 mg/dL (ref 8.9–10.3)
Chloride: 99 mmol/L (ref 98–111)
Creatinine, Ser: 1.17 mg/dL — ABNORMAL HIGH (ref 0.44–1.00)
GFR calc Af Amer: >60 mL/min (ref >60)
GFR calc non Af Amer: 55 mL/min — ABNORMAL LOW (ref >60)
Glucose, Bld: 116 mg/dL — ABNORMAL HIGH (ref 70–99)
Potassium: 3.8 mmol/L (ref 3.5–5.1)
Sodium: 139 mmol/L (ref 135–145)

## 2019-08-04 LAB — SARS CORONAVIRUS 2 (TAT 6-24 HRS): SARS Coronavirus 2: POSITIVE — AB

## 2019-08-04 LAB — LACTIC ACID, PLASMA: Lactic Acid, Venous: 0.7 mmol/L (ref 0.5–1.9)

## 2019-08-04 MED ORDER — DIPHENHYDRAMINE HCL 50 MG/ML IJ SOLN
25.0000 mg | Freq: Four times a day (QID) | INTRAMUSCULAR | Status: DC | PRN
  Administered 2019-08-04 – 2019-08-09 (×5): 25 mg via INTRAVENOUS
  Filled 2019-08-04 (×5): qty 1

## 2019-08-04 MED ORDER — MORPHINE SULFATE (PF) 4 MG/ML IV SOLN
4.0000 mg | INTRAVENOUS | Status: DC | PRN
  Administered 2019-08-04: 4 mg via INTRAVENOUS
  Filled 2019-08-04: qty 1

## 2019-08-04 MED ORDER — MORPHINE SULFATE (PF) 4 MG/ML IV SOLN
4.0000 mg | Freq: Once | INTRAVENOUS | Status: AC
  Administered 2019-08-04: 4 mg via INTRAVENOUS
  Filled 2019-08-04: qty 1

## 2019-08-04 MED ORDER — VANCOMYCIN HCL 10 G IV SOLR
2000.0000 mg | Freq: Once | INTRAVENOUS | Status: AC
  Administered 2019-08-04: 2000 mg via INTRAVENOUS
  Filled 2019-08-04: qty 2000

## 2019-08-04 MED ORDER — ONDANSETRON 4 MG PO TBDP
4.0000 mg | ORAL_TABLET | Freq: Four times a day (QID) | ORAL | Status: DC | PRN
  Administered 2019-08-04 – 2019-08-05 (×3): 4 mg via ORAL
  Filled 2019-08-04 (×3): qty 1

## 2019-08-04 MED ORDER — PIPERACILLIN-TAZOBACTAM 3.375 G IVPB
3.3750 g | Freq: Three times a day (TID) | INTRAVENOUS | Status: DC
  Administered 2019-08-04 – 2019-08-08 (×13): 3.375 g via INTRAVENOUS
  Filled 2019-08-04 (×13): qty 50

## 2019-08-04 MED ORDER — ONDANSETRON HCL 4 MG/2ML IJ SOLN
4.0000 mg | Freq: Four times a day (QID) | INTRAMUSCULAR | Status: DC | PRN
  Administered 2019-08-04 – 2019-08-08 (×4): 4 mg via INTRAVENOUS
  Filled 2019-08-04 (×4): qty 2

## 2019-08-04 MED ORDER — SODIUM CHLORIDE 0.9 % IV BOLUS
1000.0000 mL | Freq: Once | INTRAVENOUS | Status: AC
  Administered 2019-08-04: 1000 mL via INTRAVENOUS

## 2019-08-04 MED ORDER — SODIUM CHLORIDE 0.9 % IV SOLN
Freq: Once | INTRAVENOUS | Status: AC
  Administered 2019-08-04: 06:00:00 via INTRAVENOUS

## 2019-08-04 MED ORDER — ENOXAPARIN SODIUM 40 MG/0.4ML ~~LOC~~ SOLN
40.0000 mg | SUBCUTANEOUS | Status: DC
  Administered 2019-08-04 – 2019-08-09 (×6): 40 mg via SUBCUTANEOUS
  Filled 2019-08-04 (×6): qty 0.4

## 2019-08-04 MED ORDER — IOHEXOL 300 MG/ML  SOLN
100.0000 mL | Freq: Once | INTRAMUSCULAR | Status: AC | PRN
  Administered 2019-08-04: 100 mL via INTRAVENOUS

## 2019-08-04 MED ORDER — KCL IN DEXTROSE-NACL 20-5-0.45 MEQ/L-%-% IV SOLN
INTRAVENOUS | Status: DC
  Administered 2019-08-04 – 2019-08-06 (×4): via INTRAVENOUS
  Filled 2019-08-04 (×7): qty 1000

## 2019-08-04 MED ORDER — MORPHINE SULFATE (PF) 2 MG/ML IV SOLN
2.0000 mg | INTRAVENOUS | Status: DC | PRN
  Administered 2019-08-05 – 2019-08-09 (×8): 2 mg via INTRAVENOUS
  Filled 2019-08-04 (×8): qty 1

## 2019-08-04 MED ORDER — MORPHINE SULFATE (PF) 4 MG/ML IV SOLN
4.0000 mg | Freq: Once | INTRAVENOUS | Status: AC
  Administered 2019-08-04: 04:00:00 4 mg via INTRAVENOUS
  Filled 2019-08-04: qty 1

## 2019-08-04 MED ORDER — DIPHENHYDRAMINE HCL 25 MG PO CAPS
25.0000 mg | ORAL_CAPSULE | Freq: Four times a day (QID) | ORAL | Status: DC | PRN
  Administered 2019-08-05: 25 mg via ORAL
  Filled 2019-08-04: qty 1

## 2019-08-04 MED ORDER — SODIUM CHLORIDE 0.9 % IV SOLN
3.0000 g | Freq: Once | INTRAVENOUS | Status: AC
  Administered 2019-08-04: 3 g via INTRAVENOUS
  Filled 2019-08-04: qty 8

## 2019-08-04 MED ORDER — ACETAMINOPHEN 325 MG PO TABS
650.0000 mg | ORAL_TABLET | Freq: Four times a day (QID) | ORAL | Status: DC | PRN
  Administered 2019-08-05 – 2019-08-07 (×3): 650 mg via ORAL
  Filled 2019-08-04 (×3): qty 2

## 2019-08-04 MED ORDER — ONDANSETRON HCL 4 MG/2ML IJ SOLN
4.0000 mg | Freq: Four times a day (QID) | INTRAMUSCULAR | Status: DC | PRN
  Administered 2019-08-04: 4 mg via INTRAVENOUS
  Filled 2019-08-04: qty 2

## 2019-08-04 MED ORDER — SODIUM CHLORIDE (PF) 0.9 % IJ SOLN
INTRAMUSCULAR | Status: AC
  Filled 2019-08-04: qty 50

## 2019-08-04 MED ORDER — HYDROCODONE-ACETAMINOPHEN 5-325 MG PO TABS
1.0000 | ORAL_TABLET | ORAL | Status: DC | PRN
  Administered 2019-08-04 – 2019-08-07 (×9): 2 via ORAL
  Filled 2019-08-04 (×9): qty 2

## 2019-08-04 MED ORDER — SENNOSIDES-DOCUSATE SODIUM 8.6-50 MG PO TABS: 1.0000 | ORAL_TABLET | Freq: Every evening | ORAL | Status: DC | PRN

## 2019-08-04 MED ORDER — ONDANSETRON HCL 4 MG/2ML IJ SOLN
4.0000 mg | Freq: Once | INTRAMUSCULAR | Status: AC
  Administered 2019-08-04: 4 mg via INTRAVENOUS
  Filled 2019-08-04: qty 2

## 2019-08-04 NOTE — ED Notes (Signed)
Pt. Documented in error see above note in chart. 

## 2019-08-04 NOTE — H&P (Signed)
Holly Moon is an 48 y.o. female.   Chief Complaint: abd pain HPI: 48 y.o. F with h/o previous admission for perforated colon and subsequent covid infection with multiple intra-abdominal abscesses managed by IR drains currently.  She reports increasing abd pain and worsening erythema and drainage around LLQ catheter.  No fevers or nausea.    Past Medical History:  Diagnosis Date  . Hypertension 2015  . IBS (irritable bowel syndrome)   . Pulmonary embolism [facility](HCC)     Past Surgical History:  Procedure Laterality Date  . FRACTURE SURGERY Left 1987   leg fracture  . IR SINUS/FIST TUBE CHK-NON GI  07/26/2019  . RIGHT CARPAL    . WRIST FRACTURE SURGERY      Family History  Problem Relation Age of Onset  . Kidney failure Father    Social History:  reports that she has been smoking. She has been smoking about 0.05 packs per day. She has never used smokeless tobacco. She reports current alcohol use. She reports that she does not use drugs.  Allergies: No Known Allergies  Medications Prior to Admission  Medication Sig Dispense Refill  . acetaminophen (TYLENOL) 500 MG tablet Take 2 tablets (1,000 mg total) by mouth every 6 (six) hours. 30 tablet 0  . ALPRAZolam (XANAX) 0.5 MG tablet Take 0.5 mg by mouth 2 (two) times daily as needed for anxiety.     . Amino Acids-Protein Hydrolys (FEEDING SUPPLEMENT, PRO-STAT SUGAR FREE 64,) LIQD Take 30 mLs by mouth 2 (two) times daily. 887 mL 0  . aspirin EC 81 MG tablet Take 81 mg by mouth daily.    . benazepril (LOTENSIN) 20 MG tablet Take 20 mg by mouth daily.    . bisacodyl (DULCOLAX) 10 MG suppository Place 1 suppository (10 mg total) rectally as needed for moderate constipation. 12 suppository 0  . bisacodyl (DULCOLAX) 5 MG EC tablet Take 2 tablets (10 mg total) by mouth daily as needed for moderate constipation. 30 tablet 0  . busPIRone (BUSPAR) 15 MG tablet Take 15 mg by mouth 2 (two) times daily.    . busPIRone (BUSPAR) 30 MG tablet Take 1  tablet by mouth 2 (two) times daily.    Marland Kitchen. desvenlafaxine (PRISTIQ) 50 MG 24 hr tablet Take 50 mg by mouth daily.    . Ensure Max Protein (ENSURE MAX PROTEIN) LIQD Take 330 mLs (11 oz total) by mouth 2 (two) times daily. 330 mL 30  . feeding supplement, ENSURE ENLIVE, (ENSURE ENLIVE) LIQD Take 237 mLs by mouth daily. 237 mL 12  . gabapentin (NEURONTIN) 300 MG capsule Take 300 mg by mouth 3 (three) times daily.   0  . senna (SENOKOT) 8.6 MG TABS tablet Take 2 tablets (17.2 mg total) by mouth at bedtime as needed for mild constipation. 120 tablet 0  . tiZANidine (ZANAFLEX) 4 MG tablet Take 4 mg by mouth every 8 (eight) hours as needed for muscle spasms.   0  . topiramate (TOPAMAX) 100 MG tablet Take 100 mg by mouth daily.     Marland Kitchen. venlafaxine XR (EFFEXOR-XR) 150 MG 24 hr capsule Take 1 capsule by mouth daily.    . Vitamin D, Ergocalciferol, (DRISDOL) 1.25 MG (50000 UT) CAPS capsule Take 50,000 Units by mouth once a week.    Marland Kitchen. guaiFENesin (MUCINEX) 600 MG 12 hr tablet Take 1 tablet (600 mg total) by mouth 2 (two) times daily as needed. 60 tablet 0  . oxyCODONE (OXY IR/ROXICODONE) 5 MG immediate release tablet Take  1 tablet (5 mg total) by mouth every 6 (six) hours. 30 tablet 0  . polyethylene glycol (MIRALAX / GLYCOLAX) 17 g packet Take 17 g by mouth daily. 14 each 0    Results for orders placed or performed during the hospital encounter of 08/03/19 (from the past 48 hour(s))  CBC with Differential     Status: Abnormal   Collection Time: 08/04/19 12:17 AM  Result Value Ref Range   WBC 13.5 (H) 4.0 - 10.5 K/uL   RBC 4.90 3.87 - 5.11 MIL/uL   Hemoglobin 13.4 12.0 - 15.0 g/dL   HCT 12.7 51.7 - 00.1 %   MCV 90.6 80.0 - 100.0 fL   MCH 27.3 26.0 - 34.0 pg   MCHC 30.2 30.0 - 36.0 g/dL   RDW 74.9 44.9 - 67.5 %   Platelets 536 (H) 150 - 400 K/uL   nRBC 0.0 0.0 - 0.2 %   Neutrophils Relative % 74 %   Neutro Abs 10.0 (H) 1.7 - 7.7 K/uL   Lymphocytes Relative 17 %   Lymphs Abs 2.3 0.7 - 4.0 K/uL    Monocytes Relative 5 %   Monocytes Absolute 0.7 0.1 - 1.0 K/uL   Eosinophils Relative 2 %   Eosinophils Absolute 0.3 0.0 - 0.5 K/uL   Basophils Relative 1 %   Basophils Absolute 0.1 0.0 - 0.1 K/uL   Immature Granulocytes 1 %   Abs Immature Granulocytes 0.12 (H) 0.00 - 0.07 K/uL    Comment: Performed at [facility], [address], [place], [place] [address]  Basic metabolic panel     Status: Abnormal   Collection Time: 08/04/19 12:17 AM  Result Value Ref Range   Sodium 139 135 - 145 mmol/L   Potassium 3.8 3.5 - 5.1 mmol/L   Chloride 99 98 - 111 mmol/L   CO2 26 22 - 32 mmol/L   Glucose, Bld 116 (H) 70 - 99 mg/dL   BUN 24 (H) 6 - 20 mg/dL   Creatinine, Ser 4.66 (H) 0.44 - 1.00 mg/dL   Calcium 9.2 8.9 - 59.9 mg/dL   GFR calc non Af Amer 55 (L) >60 mL/min   GFR calc Af Amer >60 >60 mL/min   Anion gap 14 5 - 15    Comment: Performed at [facility], [address] [place], [place] [address]  Lactic acid, plasma     Status: None   Collection Time: 08/04/19  4:12 AM  Result Value Ref Range   Lactic Acid, Venous 0.7 0.5 - 1.9 mmol/L    Comment: Performed at [facility], [address], [place], [place] [address]   CT ABDOMEN PELVIS W CONTRAST  Result Date: 08/04/2019 CLINICAL DATA:  History of diverticulitis with increased left lower quadrant pain. EXAM: CT ABDOMEN AND PELVIS WITH CONTRAST TECHNIQUE: Multidetector CT imaging of the abdomen and pelvis was performed using the standard protocol following bolus administration of intravenous contrast. CONTRAST:  OMNIPAQUE IOHEXOL 300 MG/ML  SOLN COMPARISON:  07/25/2019 FINDINGS: Lower chest: Patchy infiltrate is again identified within the bases bilaterally but slightly improved consistent with improving COVID pneumonia. No effusion is seen. Hepatobiliary: Gallbladder is well distended. Multiple dependent stones are noted which are poorly calcified. The liver is within normal  limits. Pancreas: Unremarkable. No pancreatic ductal dilatation or surrounding inflammatory changes. Spleen: Normal in size without focal abnormality. Adrenals/Urinary Tract: Adrenal glands are within normal limits. The kidneys are well visualized bilaterally. No renal calculi or obstructive changes  are seen. The bladder is partially distended. Stomach/Bowel: Diverticular change of the colon is noted. No evidence of diverticulitis. The appendix is within normal limits. Small bowel is unremarkable. Stomach is within normal limits. Vascular/Lymphatic: No significant vascular findings are present. No enlarged abdominal or pelvic lymph nodes. Reproductive: Uterus and bilateral adnexa are unremarkable. Other: There are 2 pigtail drainage catheter is identified within the abdomen. One lies adjacent to the cecum anteriorly with a decompressed collection. The second lies slightly inferior adjacent to loops of sigmoid anteriorly stable in appearance from the prior exam. No residual collection is noted. Scattered areas of subcutaneous air are noted as well as some subcutaneous inflammatory changes surrounding the drainage catheters at both levels. No focal subcutaneous abscess is seen. Air and fluid collection is noted along the inferior liver tip which now measures 6.0 x 1.6 x 6.5 cm. This is slightly increased when compared with the prior exam at which time it measured 4.0 x 2.7 cm. The previously seen drainage catheter has been removed in the interval. No free fluid is noted within the pelvis. Musculoskeletal: Degenerative changes within the lumbar spine. IMPRESSION: Two drainage catheters are noted within the abdomen stable from the prior exam without significant residual collections. Mild inflammatory changes are noted surrounding the catheter is in their subcutaneous course without evidence of focal abscess. Increase in the air-fluid collection adjacent to the tip of the liver as described above. Diverticulosis without  diverticulitis. Persistent patchy infiltrate in the bases bilaterally although improved from the prior exam consistent with resolving COVID-19 pneumonia. Cholelithiasis without complicating factors. Electronically Signed   By: Inez Catalina M.D.   On: 08/04/2019 03:23    Review of Systems  Constitutional: Negative for chills and fever.  HENT: Negative for hearing loss and tinnitus.   Respiratory: Negative for cough and shortness of breath.   Cardiovascular: Negative for chest pain and palpitations.  Gastrointestinal: Positive for abdominal pain. Negative for nausea and vomiting.  Genitourinary: Negative for dysuria, frequency and urgency.  Musculoskeletal: Negative for myalgias.  Skin: Negative for rash.  Neurological: Negative for dizziness and headaches.    Blood pressure 139/76, pulse 72, temperature 97.8 F (36.6 C), temperature source Oral, resp. rate (!) 22, last menstrual period 07/11/2019, SpO2 100 %. Physical Exam  Constitutional: She is oriented to person, place, and time. She appears well-developed and well-nourished. No distress.  HENT:  Head: Normocephalic and atraumatic.  Eyes: Pupils are equal, round, and reactive to light. Conjunctivae and EOM are normal.  Cardiovascular: Normal rate and regular rhythm.  Respiratory: Effort normal. No respiratory distress.  GI: Soft. She exhibits no distension. There is abdominal tenderness (around L sided CT drain). There is no rebound and no guarding.  Musculoskeletal:        General: Normal range of motion.     Cervical back: Normal range of motion and neck supple.  Neurological: She is alert and oriented to person, place, and time.  Skin: Skin is warm and dry.     Assessment/Plan Pt appears to have a cellulitis around drain and it not flushing well.  Will consult IR to eval this drain as well as small RUQ fluid collection.  Zosyn and Vanc for cellulitis  Recheck CBC in AM  Rosario Adie, MD 08/65/7846, 8:45 AM

## 2019-08-04 NOTE — ED Notes (Signed)
ED TO INPATIENT HANDOFF REPORT  ED Nurse Name and Phone #: jon wled   S Name/Age/Gender Holly Moon 48 y.o. female Room/Bed: WA25/WA25  Code Status   Code Status: Prior  Home/SNF/Other Nursing [name]  Patient oriented to: self, place, time and situation Is this baseline? Yes   Triage Complete: Triage complete  Chief Complaint Intra-abdominal abscess (HCC) [K65.1]  Triage Note PT has history of perforated bowl Pt has 2 JP drains in place. PT complains of abdominal pain that started at left site after bending over. Pt expresses pain is more severe at left site but does spread to right side. There is drainage noted from site and drainage in drains. Pt state pain new starting today. Test Positive for Covid previously while hospitalized. Currently on 2L o2 at all times. No respiratory complaints today.     Allergies No Known Allergies  Level of Care/Admitting Diagnosis ED Disposition    ED Disposition Condition Comment   Admit  [facility] [[id number]]  Level of Care: Med-Surg [16]  Covid Evaluation: Asymptomatic Screening Protocol (No Symptoms)  Diagnosis: Intra-abdominal abscess [facility](HCC) [[id number]]  Admitting Physician: Holly LeveeHOMAS, Holly [phone][[id number]]  Attending Physician: Holly LeveeHOMAS, Holly [phone]]  Estimated length of stay: past midnight tomorrow  Certification:: I certify this patient will need inpatient services for at least 2 midnights       B Medical/Surgery History Past Medical History:  Diagnosis Date  . Hypertension 2015  . IBS (irritable bowel syndrome)   . Pulmonary embolism [facility]HCC)    Past Surgical History:  Procedure Laterality Date  . FRACTURE SURGERY Left 1987   leg fracture  . IR SINUS/FIST TUBE CHK-NON GI  07/26/2019  . RIGHT CARPAL    . WRIST FRACTURE SURGERY       A IV Location/Drains/Wounds Patient Lines/Drains/Airways Status   Active Line/Drains/Airways    Name:   Placement date:   Placement time:   Site:   Days:    Peripheral IV 08/04/19 Right Antecubital   08/04/19    0141    Antecubital   less than 1   Closed System Drain 1 LLQ Bulb (JP) 10 Fr.   07/18/19    1023    LLQ   17   Closed System Drain 2 RLQ Bulb (JP) 10 Fr.   07/18/19    1030    RLQ   17   Closed System Drain 3 Right;Anterior Hip Bulb (JP) 10 Fr.   07/18/19    1035    Hip   17   Closed System Drain Bulb (JP) 12 Fr.   --    --    --      External Urinary Catheter   07/17/19    1515    --   18          Intake/Output Last 24 hours No intake or output data in the 24 hours ending 08/04/19 [id number]  Labs/Imaging Results for orders placed or performed during the hospital encounter of 08/03/19 (from the past 48 hour(s))  CBC with Differential     Status: Abnormal   Collection Time: 08/04/19 12:17 AM  Result Value Ref Range   WBC 13.5 (H) 4.0 - 10.5 K/uL   RBC 4.90 3.87 - 5.11 MIL/uL   Hemoglobin 13.4 12.0 - 15.0 g/dL   HCT 84.144.4 32.436.0 - 40.146.0 %   MCV 90.6 80.0 - 100.0 fL   MCH 27.3 26.0 - 34.0 pg   MCHC 30.2 30.0 - 36.0 g/dL  RDW 14.9 11.5 - 15.5 %   Platelets 536 (H) 150 - 400 K/uL   nRBC 0.0 0.0 - 0.2 %   Neutrophils Relative % 74 %   Neutro Abs 10.0 (H) 1.7 - 7.7 K/uL   Lymphocytes Relative 17 %   Lymphs Abs 2.3 0.7 - 4.0 K/uL   Monocytes Relative 5 %   Monocytes Absolute 0.7 0.1 - 1.0 K/uL   Eosinophils Relative 2 %   Eosinophils Absolute 0.3 0.0 - 0.5 K/uL   Basophils Relative 1 %   Basophils Absolute 0.1 0.0 - 0.1 K/uL   Immature Granulocytes 1 %   Abs Immature Granulocytes 0.12 (H) 0.00 - 0.07 K/uL    Comment: Performed at [facility], [address] [place], [place] [address]  Basic metabolic panel     Status: Abnormal   Collection Time: 08/04/19 12:17 AM  Result Value Ref Range   Sodium 139 135 - 145 mmol/L   Potassium 3.8 3.5 - 5.1 mmol/L   Chloride 99 98 - 111 mmol/L   CO2 26 22 - 32 mmol/L   Glucose, Bld 116 (H) 70 - 99 mg/dL   BUN 24 (H) 6 - 20 mg/dL   Creatinine, Ser 2.87 (H) 0.44 - 1.00  mg/dL   Calcium 9.2 8.9 - 68.1 mg/dL   GFR calc non Af Amer 55 (L) >60 mL/min   GFR calc Af Amer >60 >60 mL/min   Anion gap 14 5 - 15    Comment: Performed at [facility], [address], [place], [place] [address]  Lactic acid, plasma     Status: None   Collection Time: 08/04/19  4:12 AM  Result Value Ref Range   Lactic Acid, Venous 0.7 0.5 - 1.9 mmol/L    Comment: Performed at [facility], [address] [place], [place] [address]   CT ABDOMEN PELVIS W CONTRAST  Result Date: 08/04/2019 CLINICAL DATA:  History of diverticulitis with increased left lower quadrant pain. EXAM: CT ABDOMEN AND PELVIS WITH CONTRAST TECHNIQUE: Multidetector CT imaging of the abdomen and pelvis was performed using the standard protocol following bolus administration of intravenous contrast. CONTRAST:  OMNIPAQUE IOHEXOL 300 MG/ML  SOLN COMPARISON:  07/25/2019 FINDINGS: Lower chest: Patchy infiltrate is again identified within the bases bilaterally but slightly improved consistent with improving COVID pneumonia. No effusion is seen. Hepatobiliary: Gallbladder is well distended. Multiple dependent stones are noted which are poorly calcified. The liver is within normal limits. Pancreas: Unremarkable. No pancreatic ductal dilatation or surrounding inflammatory changes. Spleen: Normal in size without focal abnormality. Adrenals/Urinary Tract: Adrenal glands are within normal limits. The kidneys are well visualized bilaterally. No renal calculi or obstructive changes are seen. The bladder is partially distended. Stomach/Bowel: Diverticular change of the colon is noted. No evidence of diverticulitis. The appendix is within normal limits. Small bowel is unremarkable. Stomach is within normal limits. Vascular/Lymphatic: No significant vascular findings are present. No enlarged abdominal or pelvic lymph nodes. Reproductive: Uterus and bilateral adnexa are unremarkable. Other: There are 2  pigtail drainage catheter is identified within the abdomen. One lies adjacent to the cecum anteriorly with a decompressed collection. The second lies slightly inferior adjacent to loops of sigmoid anteriorly stable in appearance from the prior exam. No residual collection is noted. Scattered areas of subcutaneous air are noted as well as some subcutaneous inflammatory changes surrounding the drainage catheters at both levels. No focal subcutaneous abscess is seen. Air and fluid collection is noted  along the inferior liver tip which now measures 6.0 x 1.6 x 6.5 cm. This is slightly increased when compared with the prior exam at which time it measured 4.0 x 2.7 cm. The previously seen drainage catheter has been removed in the interval. No free fluid is noted within the pelvis. Musculoskeletal: Degenerative changes within the lumbar spine. IMPRESSION: Two drainage catheters are noted within the abdomen stable from the prior exam without significant residual collections. Mild inflammatory changes are noted surrounding the catheter is in their subcutaneous course without evidence of focal abscess. Increase in the air-fluid collection adjacent to the tip of the liver as described above. Diverticulosis without diverticulitis. Persistent patchy infiltrate in the bases bilaterally although improved from the prior exam consistent with resolving COVID-19 pneumonia. Cholelithiasis without complicating factors. Electronically Signed   By: Inez Catalina M.D.   On: 08/04/2019 03:23    Pending Labs Unresulted Labs (From admission, onward)    Start     Ordered   08/04/19 0451  SARS CORONAVIRUS 2 (TAT 6-24 HRS) Nasopharyngeal Nasopharyngeal Swab  (Tier 3 (TAT 6-24 hrs))  Once,   STAT    Question Answer Comment  Is this test for diagnosis or screening Screening   Symptomatic for COVID-19 as defined by CDC No   Hospitalized for COVID-19 No   Admitted to ICU for COVID-19 No   Previously tested for COVID-19 Yes   Resident in  a congregate (group) care setting No   Employed in healthcare setting No   Pregnant No      08/04/19 0458   08/04/19 0447  Wound or Superficial Culture  Once,   STAT    Question:  Patient immune status  Answer:  Normal   08/04/19 0446          Vitals/Pain Today's Vitals   08/04/19 0530 08/04/19 0539 08/04/19 0546 08/04/19 0619  BP: 133/86  (!) 149/82   Pulse: 83  81   Resp:   17   Temp:   98.7 F (37.1 C)   TempSrc:   Oral   SpO2: 91%  92%   PainSc:  8  8  6      Isolation Precautions No active isolations  Medications Medications  sodium chloride (PF) 0.9 % injection (has no administration in time range)  vancomycin (VANCOCIN) 2,000 mg in sodium chloride 0.9 % 500 mL IVPB (0 mg Intravenous Hold 08/04/19 0545)  morphine 4 MG/ML injection 4 mg (4 mg Intravenous Given 08/04/19 0540)  ondansetron (ZOFRAN) injection 4 mg (4 mg Intravenous Given 08/04/19 0540)  morphine 4 MG/ML injection 4 mg (4 mg Intravenous Given 08/04/19 0114)  ondansetron (ZOFRAN) injection 4 mg (4 mg Intravenous Given 08/04/19 0111)  iohexol (OMNIPAQUE) 300 MG/ML solution 100 mL (100 mLs Intravenous Contrast Given 08/04/19 0236)  morphine 4 MG/ML injection 4 mg (4 mg Intravenous Given 08/04/19 0408)  Ampicillin-Sulbactam (UNASYN) 3 g in sodium chloride 0.9 % 100 mL IVPB (3 g Intravenous New Bag/Given 08/04/19 0543)  sodium chloride 0.9 % bolus 1,000 mL (1,000 mLs Intravenous New Bag/Given 08/04/19 0541)    Followed by  0.9 %  sodium chloride infusion ( Intravenous New Bag/Given 08/04/19 0542)    Mobility walks Low fall risk   Focused Assessments   R Recommendations: See Admitting Provider Note  Report given to:   Additional Notes:

## 2019-08-04 NOTE — ED Notes (Signed)
PT O2 88% on 2L PT placed on 3L currently 93%. No complaints of SHOB

## 2019-08-04 NOTE — ED Notes (Signed)
Report called to be transported after shift change

## 2019-08-04 NOTE — Progress Notes (Signed)
Referring Physician(s): Dr Romie Levee  Supervising Physician: Gilmer Mor  Patient Status:  [facility] - In-pt  Chief Complaint:  Colonic perforation with intra-abdominal abscesses and percutaneous drains 3 IR drains placed 07/18/19  Subjective:  One drain removed after injection evaluation 07/26/19:  1)  Drain injections X 3 2) Attempted exchange of RUQ drain - unsuccessful and completely removed.  CT today: IMPRESSION: Two drainage catheters are noted within the abdomen stable from the prior exam without significant residual collections. Mild inflammatory changes are noted surrounding the catheter is in their subcutaneous course without evidence of focal abscess. Increase in the air-fluid collection adjacent to the tip of the liver as described above. Diverticulosis without diverticulitis.  Pt with pain at LLQ/Midline drain catheter Request for evaluation---and New RUQ collection evaluation-- possible drain?  Discussed with Dr Loreta Ave He recommends LLQ drain removal Continue RUQ drain This new collection communicates with already draining abscess in this location  Allergies: Patient has no known allergies.  Medications: Prior to Admission medications   Medication Sig Start Date End Date Taking? Authorizing Provider  acetaminophen (TYLENOL) 500 MG tablet Take 2 tablets (1,000 mg total) by mouth every 6 (six) hours. 07/26/19  Yes Calvert Cantor, MD  ALPRAZolam Prudy Feeler) 0.5 MG tablet Take 0.5 mg by mouth 2 (two) times daily as needed for anxiety.  06/30/19  Yes [provider]  Amino Acids-Protein Hydrolys (FEEDING SUPPLEMENT, PRO-STAT SUGAR FREE 64,) LIQD Take 30 mLs by mouth 2 (two) times daily. 07/26/19  Yes Calvert Cantor, MD  aspirin EC 81 MG tablet Take 81 mg by mouth daily.   Yes [provider]  benazepril (LOTENSIN) 20 MG tablet Take 20 mg by mouth daily.   Yes [provider]  bisacodyl (DULCOLAX) 10 MG suppository Place 1 suppository (10  mg total) rectally as needed for moderate constipation. 07/26/19  Yes Calvert Cantor, MD  bisacodyl (DULCOLAX) 5 MG EC tablet Take 2 tablets (10 mg total) by mouth daily as needed for moderate constipation. 07/26/19  Yes Calvert Cantor, MD  busPIRone (BUSPAR) 15 MG tablet Take 15 mg by mouth 2 (two) times daily. 07/06/19  Yes [provider]  busPIRone (BUSPAR) 30 MG tablet Take 1 tablet by mouth 2 (two) times daily. 06/29/19  Yes [provider]  desvenlafaxine (PRISTIQ) 50 MG 24 hr tablet Take 50 mg by mouth daily. 07/04/19  Yes [provider]  Ensure Max Protein (ENSURE MAX PROTEIN) LIQD Take 330 mLs (11 oz total) by mouth 2 (two) times daily. 07/26/19  Yes Calvert Cantor, MD  feeding supplement, ENSURE ENLIVE, (ENSURE ENLIVE) LIQD Take 237 mLs by mouth daily. 07/26/19  Yes Calvert Cantor, MD  gabapentin (NEURONTIN) 300 MG capsule Take 300 mg by mouth 3 (three) times daily.  01/25/18  Yes [provider]  senna (SENOKOT) 8.6 MG TABS tablet Take 2 tablets (17.2 mg total) by mouth at bedtime as needed for mild constipation. 07/26/19  Yes Calvert Cantor, MD  tiZANidine (ZANAFLEX) 4 MG tablet Take 4 mg by mouth every 8 (eight) hours as needed for muscle spasms.  02/04/18  Yes [provider]  topiramate (TOPAMAX) 100 MG tablet Take 100 mg by mouth daily.  06/29/19  Yes [provider]  venlafaxine XR (EFFEXOR-XR) 150 MG 24 hr capsule Take 1 capsule by mouth daily. 06/29/19  Yes [provider]  Vitamin D, Ergocalciferol, (DRISDOL) 1.25 MG (50000 UT) CAPS capsule Take 50,000 Units by mouth once a week. 07/30/19  Yes [provider]  guaiFENesin (MUCINEX) 600 MG 12 hr tablet Take 1 tablet (600 mg total) by mouth 2 (two) times daily as needed. 07/26/19   Calvert Cantorizwan, Saima, MD  oxyCODONE (OXY IR/ROXICODONE) 5 MG immediate release tablet Take 1 tablet (5 mg total) by mouth every 6 (six) hours. 07/26/19   Calvert Cantorizwan, Saima, MD  polyethylene glycol (MIRALAX /  GLYCOLAX) 17 g packet Take 17 g by mouth daily. 07/26/19   Calvert Cantorizwan, Saima, MD     Vital Signs: BP 139/76 (BP Location: Left Arm)   Pulse 72   Temp 97.8 F (36.6 C) (Oral)   Resp (!) 22   LMP 07/11/2019 Comment: not preg  SpO2 100%   Physical Exam Vitals reviewed.  Skin:    General: Skin is warm and dry.     Comments: RUQ drain and midline drain are intact OP is minimal for both  Removal midline drain without complication Dressing placed  RUQ drain intact OP milky yellow Culture ABUNDANT ESCHERICHIA COLI  ABUNDANT BACTEROIDES SPECIES NOT FRAGILIS  BETA LACTAMASE POSITIVE  MODERATE STREPTOCOCCUS GROUP C  Report Status 07/23/2019 FINAL  Organism ID, Bacteria ESCHERICHIA COLI     Neurological:     Mental Status: She is alert.     Imaging: CT ABDOMEN PELVIS W CONTRAST  Result Date: 08/04/2019 CLINICAL DATA:  History of diverticulitis with increased left lower quadrant pain. EXAM: CT ABDOMEN AND PELVIS WITH CONTRAST TECHNIQUE: Multidetector CT imaging of the abdomen and pelvis was performed using the standard protocol following bolus administration of intravenous contrast. CONTRAST:  100mL OMNIPAQUE IOHEXOL 300 MG/ML  SOLN COMPARISON:  07/25/2019 FINDINGS: Lower chest: Patchy infiltrate is again identified within the bases bilaterally but slightly improved consistent with improving COVID pneumonia. No effusion is seen. Hepatobiliary: Gallbladder is well distended. Multiple dependent stones are noted which are poorly calcified. The liver is within normal limits. Pancreas: Unremarkable. No pancreatic ductal dilatation or surrounding inflammatory changes. Spleen: Normal in size without focal abnormality. Adrenals/Urinary Tract: Adrenal glands are within normal limits. The kidneys are well visualized bilaterally. No renal calculi or obstructive changes are seen. The bladder is partially distended. Stomach/Bowel: Diverticular change of the colon is noted. No evidence of diverticulitis.  The appendix is within normal limits. Small bowel is unremarkable. Stomach is within normal limits. Vascular/Lymphatic: No significant vascular findings are present. No enlarged abdominal or pelvic lymph nodes. Reproductive: Uterus and bilateral adnexa are unremarkable. Other: There are 2 pigtail drainage catheter is identified within the abdomen. One lies adjacent to the cecum anteriorly with a decompressed collection. The second lies slightly inferior adjacent to loops of sigmoid anteriorly stable in appearance from the prior exam. No residual collection is noted. Scattered areas of subcutaneous air are noted as well as some subcutaneous inflammatory changes surrounding the drainage catheters at both levels. No focal subcutaneous abscess is seen. Air and fluid collection is noted along the inferior liver tip which now measures 6.0 x 1.6 x 6.5 cm. This is slightly increased when compared with the prior exam at which time it measured 4.0 x 2.7 cm. The previously seen drainage catheter has been removed in the interval. No free fluid is noted within the pelvis. Musculoskeletal: Degenerative changes within the lumbar spine. IMPRESSION: Two drainage catheters are noted within the abdomen stable from the prior exam without significant residual collections. Mild inflammatory changes are noted surrounding the catheter is in their subcutaneous course without evidence of focal abscess. Increase in the air-fluid collection adjacent to the tip of the liver as described above.  Diverticulosis without diverticulitis. Persistent patchy infiltrate in the bases bilaterally although improved from the prior exam consistent with resolving COVID-19 pneumonia. Cholelithiasis without complicating factors. Electronically Signed   By: Inez Catalina M.D.   On: 08/04/2019 03:23    Labs:  CBC: Recent Labs    07/21/19 0437 07/22/19 0500 07/25/19 0451 08/04/19 0017  WBC 7.6 7.1 7.4 13.5*  HGB 10.9* 11.5* 12.5 13.4  HCT 35.2* 37.0  40.8 44.4  PLT 593* 707* 759* 536*    COAGS: Recent Labs    07/17/19 1601 07/18/19 0449  INR 1.5* 1.5*    BMP: Recent Labs    07/22/19 0500 07/23/19 0425 07/24/19 0451 08/04/19 0017  NA 137 136 136 139  K 4.2 4.4 4.5 3.8  CL 102 99 100 99  CO2 26 26 26 26   GLUCOSE 135* 133* 129* 116*  BUN 20 23* 26* 24*  CALCIUM 9.0 9.1 9.2 9.2  CREATININE 0.57 0.66 0.82 1.17*  GFRNONAA >60 >60 >60 55*  GFRAA >60 >60 >60 >60    LIVER FUNCTION TESTS: Recent Labs    07/17/19 1034 07/18/19 0449 07/19/19 0416 07/23/19 0425  BILITOT 0.7 0.6 0.7 0.2*  AST 23 30 48* 23  ALT 14 16 23 27   ALKPHOS 87 79 74 88  PROT 6.8 6.6 6.3* 7.3  ALBUMIN 2.4* 2.2* 2.1* 2.5*    Assessment and Plan:  Intra abdominal abscess drains intact Midline drain removed per Dr Earleen Newport recommendation RUQ drain intact New small RUQ collection- communicates with this drain To remain for now Will follow  Electronically Signed: Lavonia Drafts, PA-C 08/04/2019, 11:13 AM   I spent a total of 25 Minutes at the the patient's bedside AND on the patient's hospital floor or unit, greater than 50% of which was counseling/coordinating care for abd abscess drains    Patient ID: Holly Moon, female   DOB: May 09, 1971, 48 y.o.   MRN: [id number]

## 2019-08-04 NOTE — ED Notes (Signed)
PT tearful and stated her anxiety was increasing and she was scared. Pt was reassured. PT also stated pain has increase to an 8. MD made aware.

## 2019-08-04 NOTE — ED Notes (Signed)
Patient transported to CT 

## 2019-08-04 NOTE — Progress Notes (Signed)
A consult was received from an ED physician for vancomycin per pharmacy dosing.  The patient's profile has been reviewed for ht/wt/allergies/indication/available labs.   A one time order has been placed for Vancomycin 2 Gm.  Further antibiotics/pharmacy consults should be ordered by admitting physician if indicated.                       Thank you, Dorrene German 08/04/2019  4:55 AM

## 2019-08-04 NOTE — Progress Notes (Signed)
Patient ID: Holly Moon, female   DOB: 1971/07/13, 48 y.o.   MRN: [id number]   Request made for evaluation of small RUQ collection-- possible drain placement  Discussed with Dr Earleen Newport: LLQ drain can probably be removed No OP and most recent CT and drain injection show no collection: 08/04/19: Two drainage catheters are noted within the abdomen stable from the prior exam without significant residual collections  No other drains need to be placed at this point RUQ collection communicates with abscess and drain already in place  PA will see pt today and remove LLQ/midline drain Continue RUQ drain

## 2019-08-05 LAB — CBC
HCT: 46.2 % — ABNORMAL HIGH (ref 36.0–46.0)
Hemoglobin: 13.5 g/dL (ref 12.0–15.0)
MCH: 27 pg (ref 26.0–34.0)
MCHC: 29.2 g/dL — ABNORMAL LOW (ref 30.0–36.0)
MCV: 92.4 fL (ref 80.0–100.0)
Platelets: 489 10*3/uL — ABNORMAL HIGH (ref 150–400)
RBC: 5 MIL/uL (ref 3.87–5.11)
RDW: 14.9 % (ref 11.5–15.5)
WBC: 10.9 10*3/uL — ABNORMAL HIGH (ref 4.0–10.5)
nRBC: 0 % (ref 0.0–0.2)

## 2019-08-05 LAB — BASIC METABOLIC PANEL
Anion gap: 11 (ref 5–15)
BUN: 14 mg/dL (ref 6–20)
CO2: 26 mmol/L (ref 22–32)
Calcium: 8.8 mg/dL — ABNORMAL LOW (ref 8.9–10.3)
Chloride: 102 mmol/L (ref 98–111)
Creatinine, Ser: 1.02 mg/dL — ABNORMAL HIGH (ref 0.44–1.00)
GFR calc Af Amer: >60 mL/min (ref >60)
GFR calc non Af Amer: >60 mL/min (ref >60)
Glucose, Bld: 125 mg/dL — ABNORMAL HIGH (ref 70–99)
Potassium: 4.4 mmol/L (ref 3.5–5.1)
Sodium: 139 mmol/L (ref 135–145)

## 2019-08-05 MED ORDER — LIP MEDEX EX OINT
TOPICAL_OINTMENT | CUTANEOUS | Status: DC | PRN
  Administered 2019-08-05: 12:00:00 via TOPICAL
  Filled 2019-08-05: qty 7

## 2019-08-05 NOTE — Progress Notes (Signed)
Pt c/o of something bubbling out of her RLQ drain. Checked it and there was no excessive drainage. Put new dressing on both and gave Pt morphine for pain. SShe complained of nausea and coughed up very little spittle. Pt started crying saying last time she was here she was not well taken care off. Calmed Pt down and encouraged her to relax and rest. Provided Pt with a can of coca cola as requested. Informed CN.

## 2019-08-05 NOTE — Progress Notes (Signed)
CN spoke with IP on call about COVID status. Pt tested positive first on 11/24. If pt has been asymptomatic and tested positive >10 days it is okay to remove precautions. Primary RN was notified and will inform provider.

## 2019-08-05 NOTE — Plan of Care (Signed)
  Problem: Education: Goal: Knowledge of risk factors and measures for prevention of condition will improve Outcome: Progressing   Problem: Coping: Goal: Psychosocial and spiritual needs will be supported Outcome: Progressing   Problem: Respiratory: Goal: Will maintain a patent airway Outcome: Progressing Goal: Complications related to the disease process, condition or treatment will be avoided or minimized Outcome: Progressing   Problem: Clinical Measurements: Goal: Diagnostic test results will improve Outcome: Progressing Goal: Respiratory complications will improve Outcome: Progressing   Problem: Elimination: Goal: Will not experience complications related to bowel motility Outcome: Progressing Goal: Will not experience complications related to urinary retention Outcome: Progressing   Problem: Pain Managment: Goal: General experience of comfort will improve Outcome: Progressing   Problem: Safety: Goal: Ability to remain free from injury will improve Outcome: Progressing   Problem: Skin Integrity: Goal: Risk for impaired skin integrity will decrease Outcome: Progressing

## 2019-08-05 NOTE — Progress Notes (Signed)
Progress Note: General Surgery Service   Chief Complaint/Subjective: Fevers overnight, feels movement at the previous drain site including bubbles  Objective: Vital signs in last 24 hours: Temp:  [98.2 F (36.8 C)-102.6 F (39.2 C)] 99.9 F (37.7 C) (12/13 0600) Pulse Rate:  [62-98] 98 (12/13 0422) Resp:  [20-22] 20 (12/13 0422) BP: (142-163)/(77-101) 163/101 (12/13 0422) SpO2:  [94 %-100 %] 100 % (12/13 0422) Last BM Date: 08/03/19  Intake/Output from previous day: 12/12 0701 - 12/13 0700 In: 2526.6 [P.O.:720; I.V.:505; IV Piggyback:1301.6] Out: 1800 [Urine:1800] Intake/Output this shift: Total I/O In: -  Out: 300 [Urine:300]  Gen: NAD  Resp: nonlabored  Card: RRR  Abd: soft, obesity, right drain with tan output, previous lower left drain site with brown liquid output  Lab Results: CBC  Recent Labs    08/04/19 0017 08/05/19 0401  WBC 13.5* 10.9*  HGB 13.4 13.5  HCT 44.4 46.2*  PLT 536* 489*   BMET Recent Labs    08/04/19 0017 08/05/19 0401  NA 139 139  K 3.8 4.4  CL 99 102  CO2 26 26  GLUCOSE 116* 125*  BUN 24* 14  CREATININE 1.17* 1.02*  CALCIUM 9.2 8.8*   PT/INR No results for input(s): LABPROT, INR in the last 72 hours. ABG No results for input(s): PHART, HCO3 in the last 72 hours.  Invalid input(s): PCO2, PO2  Anti-infectives: Anti-infectives (From admission, onward)   Start     Dose/Rate Route Frequency Ordered Stop   08/04/19 0900  piperacillin-tazobactam (ZOSYN) IVPB 3.375 g     3.375 g 12.5 mL/hr over 240 Minutes Intravenous Every 8 hours 08/04/19 0844     08/04/19 0515  vancomycin (VANCOCIN) 2,000 mg in sodium chloride 0.9 % 500 mL IVPB     2,000 mg 250 mL/hr over 120 Minutes Intravenous  Once 08/04/19 0455 08/04/19 0949   08/04/19 0445  Ampicillin-Sulbactam (UNASYN) 3 g in sodium chloride 0.9 % 100 mL IVPB     3 g 200 mL/hr over 30 Minutes Intravenous  Once 08/04/19 0444 08/04/19 0748      Medications: Scheduled Meds: .  enoxaparin (LOVENOX) injection  40 mg Subcutaneous Q24H   Continuous Infusions: . dextrose 5 % and 0.45 % NaCl with KCl 20 mEq/L Stopped (08/05/19 0550)  . piperacillin-tazobactam (ZOSYN)  IV 3.375 g (08/05/19 0550)   PRN Meds:.acetaminophen, diphenhydrAMINE **OR** diphenhydrAMINE, HYDROcodone-acetaminophen, morphine injection, ondansetron **OR** ondansetron (ZOFRAN) IV, senna-docusate  Assessment/Plan: COVID 19 - positive 11/24, positive 12/12, appears to have completed the 3 week isolation period. -consider removing airborne precautions  Diverticulitis - had multiple drains placed. Cellulitis around lower left drain yesterday, removed yesterday, overnight fecal material draining out concerning for fistula -continue antibiotics -clear liquids plus breeze -continue replacing bandage when saturated   LOS: 1 day   Mickeal Skinner, MD [facility]

## 2019-08-05 NOTE — Progress Notes (Signed)
Pt spiked a high tem of 102.6 Given tylenol temp rechecked by nurse is at 99.9

## 2019-08-06 MED ORDER — PROCHLORPERAZINE EDISYLATE 10 MG/2ML IJ SOLN
5.0000 mg | INTRAMUSCULAR | Status: DC | PRN
  Administered 2019-08-06 – 2019-08-08 (×7): 5 mg via INTRAVENOUS
  Filled 2019-08-06 (×7): qty 2

## 2019-08-06 MED ORDER — ALPRAZOLAM 0.5 MG PO TABS
0.5000 mg | ORAL_TABLET | Freq: Two times a day (BID) | ORAL | Status: DC | PRN
  Administered 2019-08-06 – 2019-08-09 (×4): 0.5 mg via ORAL
  Filled 2019-08-06 (×4): qty 1

## 2019-08-06 MED ORDER — ENSURE ENLIVE PO LIQD
237.0000 mL | Freq: Three times a day (TID) | ORAL | Status: DC
  Administered 2019-08-06 – 2019-08-08 (×6): 237 mL via ORAL

## 2019-08-06 MED ORDER — ENSURE ENLIVE PO LIQD: 237.0000 mL | Freq: Two times a day (BID) | ORAL | Status: DC

## 2019-08-06 NOTE — Progress Notes (Signed)
Patient ID: Holly Moon, female   DOB: 02-18-1971, 48 y.o.   MRN: [id number]       Subjective: Patient states that clear liquids are too sweet and make her nauseated.  Would prefer full liquids so she can have ensure and grits.  Not having much in the way of abdominal pain currently.  Still with stool draining from old drain site.    Patient denies SOB.  Was placed on O2 at discharge for her COVID.  Rare cough.  +fevers, but likely secondary to diverticulitis.  No other upper respiratory symptoms.  ROS: See above, otherwise other systems negative  Objective: Vital signs in last 24 hours: Temp:  [98.5 F (36.9 C)-99.6 F (37.6 C)] 99.6 F (37.6 C) (12/14 0454) Pulse Rate:  [71-80] 80 (12/14 0454) Resp:  [16-20] 20 (12/14 0454) BP: (117-144)/(75-89) 143/89 (12/14 0454) SpO2:  [90 %-96 %] 93 % (12/14 0454) Last BM Date: 08/03/19  Intake/Output from previous day: 12/13 0701 - 12/14 0700 In: 2236.6 [P.O.:720; I.V.:1361.2; IV Piggyback:140.4] Out: 500 [Urine:500] Intake/Output this shift: No intake/output data recorded.  PE: Heart: regular Lungs: CTAB, on 2L O2, sats between 93-100% between RA and 2L Abd: soft, minimally tender, anterior old drain site with feculent output on her skin.  RLQ drain in place with some tan output.  +BS  Lab Results:  Recent Labs    08/04/19 0017 08/05/19 0401  WBC 13.5* 10.9*  HGB 13.4 13.5  HCT 44.4 46.2*  PLT 536* 489*   BMET Recent Labs    08/04/19 0017 08/05/19 0401  NA 139 139  K 3.8 4.4  CL 99 102  CO2 26 26  GLUCOSE 116* 125*  BUN 24* 14  CREATININE 1.17* 1.02*  CALCIUM 9.2 8.8*   PT/INR No results for input(s): LABPROT, INR in the last 72 hours. CMP     Component Value Date/Time   NA 139 08/05/2019 0401   K 4.4 08/05/2019 0401   CL 102 08/05/2019 0401   CO2 26 08/05/2019 0401   GLUCOSE 125 (H) 08/05/2019 0401   BUN 14 08/05/2019 0401   CREATININE 1.02 (H) 08/05/2019 0401   CALCIUM 8.8 (L) 08/05/2019 0401   PROT 7.3 07/23/2019 0425   ALBUMIN 2.5 (L) 07/23/2019 0425   AST 23 07/23/2019 0425   ALT 27 07/23/2019 0425   ALKPHOS 88 07/23/2019 0425   BILITOT 0.2 (L) 07/23/2019 0425   GFRNONAA >60 08/05/2019 0401   GFRAA >60 08/05/2019 0401   Lipase     Component Value Date/Time   LIPASE 17 07/07/2019 2057       Studies/Results: No results found.  Anti-infectives: Anti-infectives (From admission, onward)   Start     Dose/Rate Route Frequency Ordered Stop   08/04/19 0900  piperacillin-tazobactam (ZOSYN) IVPB 3.375 g     3.375 g 12.5 mL/hr over 240 Minutes Intravenous Every 8 hours 08/04/19 0844     08/04/19 0515  vancomycin (VANCOCIN) 2,000 mg in sodium chloride 0.9 % 500 mL IVPB     2,000 mg 250 mL/hr over 120 Minutes Intravenous  Once 08/04/19 0455 08/04/19 0949   08/04/19 0445  Ampicillin-Sulbactam (UNASYN) 3 g in sodium chloride 0.9 % 100 mL IVPB     3 g 200 mL/hr over 30 Minutes Intravenous  Once 08/04/19 0444 08/04/19 0748       Assessment/Plan COVID 19 - positive 11/24, positive 12/12, will complete her 3 week isolation on 12/17 from first positive test as she is essentially asymptomatic.  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  Will have WOC see patient to apply colostomy pouch over site to contain feculent output. -I have contacted IR to let them know patient is here at she still has her RLQ drain -had a length discussion today with patient about her disease process along with risks and complications of an acute surgery in the setting of extensive inflammation, infection, her morbid obesity, as well as her recent COVID diagnosis.  If we can avoid surgery currently, the hope would be for intervention colectomy when it is safer for the patient.  However, if the patient doesn't improve then  she may require surgery this admission. -give full liquids as CLD makes her nauseous. -cont resource breeze  FEN - FLD, breeze VTE - Lovenox ID - [email or web address]   LOS: 2 days    [name] , [facility] 08/06/2019, 11:30 AM Please see Amion for pager number during day hours 7:00am-4:30pm

## 2019-08-06 NOTE — Progress Notes (Signed)
Patient weaned to room air. Pt. Ambulated to chair and sat for 1 hour before going back to bed. Minimal pain, some cramping. Colostomy placed on patient wound and minimal output currently.

## 2019-08-06 NOTE — Consult Note (Addendum)
[place] Nurse Consult Note: Reason for Consult: Consult requested for full thickness leaking area on abd.  Surgical team following for assessment and plan of care and has requested that an ostomy pouch be applied to contain drainage from this site.  WOC was at [facility] location earlier, but I am now at [facility]. Orders provided for bedside nurses to apply ostomy pouch to the location, and if a larger size is required, then small [place] pouch can be used.  [place] team will assess pouch seal tomorrow.  Julien Girt MSN, RN, [place], [place], [place]

## 2019-08-06 NOTE — TOC Progression Note (Addendum)
Transition of Care [facility]) - Progression Note    Patient Details  Name: Holly Moon MRN: [id number] Date of Birth: March 30, 1971  Transition of Care [facility]) CM/SW Contact  Purcell Mouton, RN Phone Number: 08/06/2019, 4:24 PM  Clinical Narrative:     Pt discharged home on 12/2 readmitted 12/12. Pt COVID Positive 11/24 and 12/12. Pt home with husband and home O2 from [place]. Unable to find an [facility] agency to take pt's insurance. Pt home with her husband. Will continue to follow.         Expected Discharge Plan and Services                                                 Social Determinants of Health (SDOH) Interventions    Readmission Risk Interventions No flowsheet data found.

## 2019-08-07 LAB — BASIC METABOLIC PANEL
Anion gap: 11 (ref 5–15)
BUN: 10 mg/dL (ref 6–20)
CO2: 21 mmol/L — ABNORMAL LOW (ref 22–32)
Calcium: 8.7 mg/dL — ABNORMAL LOW (ref 8.9–10.3)
Chloride: 104 mmol/L (ref 98–111)
Creatinine, Ser: 0.81 mg/dL (ref 0.44–1.00)
GFR calc Af Amer: >60 mL/min (ref >60)
GFR calc non Af Amer: >60 mL/min (ref >60)
Glucose, Bld: 94 mg/dL (ref 70–99)
Potassium: 4.1 mmol/L (ref 3.5–5.1)
Sodium: 136 mmol/L (ref 135–145)

## 2019-08-07 LAB — CBC
HCT: 40.9 % (ref 36.0–46.0)
Hemoglobin: 12.1 g/dL (ref 12.0–15.0)
MCH: 26.7 pg (ref 26.0–34.0)
MCHC: 29.6 g/dL — ABNORMAL LOW (ref 30.0–36.0)
MCV: 90.3 fL (ref 80.0–100.0)
Platelets: 348 10*3/uL (ref 150–400)
RBC: 4.53 MIL/uL (ref 3.87–5.11)
RDW: 14.7 % (ref 11.5–15.5)
WBC: 6.3 10*3/uL (ref 4.0–10.5)
nRBC: 0 % (ref 0.0–0.2)

## 2019-08-07 MED ORDER — DESVENLAFAXINE SUCCINATE ER 50 MG PO TB24
50.0000 mg | ORAL_TABLET | Freq: Every day | ORAL | Status: DC
  Filled 2019-08-07 (×3): qty 1

## 2019-08-07 MED ORDER — TIZANIDINE HCL 4 MG PO TABS: 4.0000 mg | ORAL_TABLET | Freq: Three times a day (TID) | ORAL | Status: DC | PRN

## 2019-08-07 MED ORDER — BENAZEPRIL HCL 20 MG PO TABS
20.0000 mg | ORAL_TABLET | Freq: Every day | ORAL | Status: DC
  Administered 2019-08-07 – 2019-08-09 (×3): 20 mg via ORAL
  Filled 2019-08-07 (×3): qty 1

## 2019-08-07 MED ORDER — VENLAFAXINE HCL ER 150 MG PO CP24
150.0000 mg | ORAL_CAPSULE | Freq: Every day | ORAL | Status: DC
  Administered 2019-08-07 – 2019-08-09 (×3): 150 mg via ORAL
  Filled 2019-08-07 (×3): qty 1

## 2019-08-07 MED ORDER — BUSPIRONE HCL 10 MG PO TABS
30.0000 mg | ORAL_TABLET | Freq: Two times a day (BID) | ORAL | Status: DC
  Administered 2019-08-07 – 2019-08-09 (×5): 30 mg via ORAL
  Filled 2019-08-07 (×5): qty 3

## 2019-08-07 MED ORDER — ASPIRIN EC 81 MG PO TBEC
81.0000 mg | DELAYED_RELEASE_TABLET | Freq: Every day | ORAL | Status: DC
  Administered 2019-08-07 – 2019-08-09 (×3): 81 mg via ORAL
  Filled 2019-08-07 (×3): qty 1

## 2019-08-07 MED ORDER — TOPIRAMATE 100 MG PO TABS
100.0000 mg | ORAL_TABLET | Freq: Every day | ORAL | Status: DC
  Administered 2019-08-07 – 2019-08-09 (×3): 100 mg via ORAL
  Filled 2019-08-07 (×3): qty 1

## 2019-08-07 MED ORDER — GABAPENTIN 300 MG PO CAPS
300.0000 mg | ORAL_CAPSULE | Freq: Three times a day (TID) | ORAL | Status: DC
  Administered 2019-08-07 – 2019-08-09 (×6): 300 mg via ORAL
  Filled 2019-08-07 (×6): qty 1

## 2019-08-07 MED ORDER — ALPRAZOLAM 0.5 MG PO TABS: 0.5000 mg | ORAL_TABLET | Freq: Two times a day (BID) | ORAL | Status: DC | PRN

## 2019-08-07 NOTE — Progress Notes (Signed)
Patient ID: Holly Moon, female   DOB: July 03, 1971, 48 y.o.   MRN: [id number]       Subjective: Patient doesn't feel great today.  Having a lot of back pain, which is chronic in nature and getting ready to start her menses.  She did have some nausea this morning, but also states her room has a sewage type smell which may be contributing to some of her nausea as well.  ROS: See above, otherwise other systems negative  Objective: Vital signs in last 24 hours: Temp:  [98.4 F (36.9 C)-99.7 F (37.6 C)] 98.4 F (36.9 C) (12/15 0926) Pulse Rate:  [63-78] 78 (12/15 0926) Resp:  [18-20] 18 (12/15 0926) BP: (160-166)/(88-112) 160/88 (12/15 0926) SpO2:  [94 %-99 %] 94 % (12/15 0926) Weight:  [134.6 kg] 134.6 kg (12/15 1015) Last BM Date: 08/06/19  Intake/Output from previous day: 12/14 0701 - 12/15 0700 In: 2241.8 [P.O.:360; I.V.:1690.3; IV Piggyback:191.6] Out: 1575 [Urine:1575] Intake/Output this shift: Total I/O In: 240 [P.O.:240] Out: -   PE: Heart: regular Lungs: CTAB Abd: soft, obese, relatively nontender to palpation, RLQ drain with minimal cloudy tan output.  Colostomy pouch in place over midline old drain site with liquid stool and gas present.  Lab Results:  Recent Labs    08/05/19 0401 08/07/19 0415  WBC 10.9* 6.3  HGB 13.5 12.1  HCT 46.2* 40.9  PLT 489* 348   BMET Recent Labs    08/05/19 0401 08/07/19 0415  NA 139 136  K 4.4 4.1  CL 102 104  CO2 26 21*  GLUCOSE 125* 94  BUN 14 10  CREATININE 1.02* 0.81  CALCIUM 8.8* 8.7*   PT/INR No results for input(s): LABPROT, INR in the last 72 hours. CMP     Component Value Date/Time   NA 136 08/07/2019 0415   K 4.1 08/07/2019 0415   CL 104 08/07/2019 0415   CO2 21 (L) 08/07/2019 0415   GLUCOSE 94 08/07/2019 0415   BUN 10 08/07/2019 0415   CREATININE 0.81 08/07/2019 0415   CALCIUM 8.7 (L) 08/07/2019 0415   PROT 7.3 07/23/2019 0425   ALBUMIN 2.5 (L) 07/23/2019 0425   AST 23 07/23/2019 0425   ALT 27 07/23/2019 0425   ALKPHOS 88 07/23/2019 0425   BILITOT 0.2 (L) 07/23/2019 0425   GFRNONAA >60 08/07/2019 0415   GFRAA >60 08/07/2019 0415   Lipase     Component Value Date/Time   LIPASE 17 07/07/2019 2057       Studies/Results: No results found.  Anti-infectives: Anti-infectives (From admission, onward)   Start     Dose/Rate Route Frequency Ordered Stop   08/04/19 0900  piperacillin-tazobactam (ZOSYN) IVPB 3.375 g     3.375 g 12.5 mL/hr over 240 Minutes Intravenous Every 8 hours 08/04/19 0844     08/04/19 0515  vancomycin (VANCOCIN) 2,000 mg in sodium chloride 0.9 % 500 mL IVPB     2,000 mg 250 mL/hr over 120 Minutes Intravenous  Once 08/04/19 0455 08/04/19 0949   08/04/19 0445  Ampicillin-Sulbactam (UNASYN) 3 g in sodium chloride 0.9 % 100 mL IVPB     3 g 200 mL/hr over 30 Minutes Intravenous  Once 08/04/19 0444 08/04/19 0748       Assessment/Plan COVID 19 - positive 11/24, positive 12/12, will complete her 3 week isolation on 12/17 from first positive test as she is essentially asymptomatic.  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  Will have WOC see patient to apply colostomy pouch over site to contain feculent output. -I have contacted IR to let them know patient is here as she still has her RLQ drain.  She is asking today when this can come out.  I told her that would be up to IR. -had a length discussion today with patient about her disease process along with risks and complications of an acute surgery in the setting of extensive inflammation, infection, her morbid obesity, as well as her recent COVID diagnosis.  If we can avoid surgery currently, the hope would be for intervention colectomy when it is safer for the patient.  However, if the patient doesn't improve then she  may require surgery this admission. -cont full liquids -CBC and BMET are normal.   -cont abx therapy and go slowly with patient.  Cont conservative therapy.  FEN - FLD, breeze VTE - Lovenox ID - [email or web address]   LOS: 3 days    [name] , [facility] 08/07/2019, 12:36 PM Please see Amion for pager number during day hours 7:00am-4:30pm

## 2019-08-07 NOTE — Consult Note (Signed)
[place] Nurse wound follow up Bedside nurse applied colostomy pouch over abd fistula site yesterday.  Surgical team notes indicate; "Colostomy pouch in place over midline old drain site with liquid stool and gas present."  WOC team will continue to follow for assistance if needed. Julien Girt MSN, RN, [place], [place], [place]

## 2019-08-08 MED ORDER — AMOXICILLIN-POT CLAVULANATE 875-125 MG PO TABS
1.0000 | ORAL_TABLET | Freq: Two times a day (BID) | ORAL | Status: DC
  Administered 2019-08-08 – 2019-08-09 (×3): 1 via ORAL
  Filled 2019-08-08 (×3): qty 1

## 2019-08-08 MED ORDER — ZOLPIDEM TARTRATE 5 MG PO TABS
5.0000 mg | ORAL_TABLET | Freq: Every day | ORAL | Status: DC
  Administered 2019-08-08: 5 mg via ORAL
  Filled 2019-08-08: qty 1

## 2019-08-08 NOTE — Consult Note (Addendum)
[place] Nurse wound follow up Pt is followed by the surgical team for assessment and plan of care. Bedside nurse applied colostomy pouch over abd fistula site on 12/14.  Discussed with nurse; she states it is intact with good seal.  Supplies in the room for staff use and instructions have been provided for application.  [place] team will check weekly to determine if further assistance is needed.  Julien Girt MSN, RN, [place], [place], [place]

## 2019-08-08 NOTE — Progress Notes (Signed)
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 Grace Isaac on 11/25. 2 have been pulled.  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 There has been essentially no output from the remaining drain. We are asked to evaluate recent imaging and   Subjective:  Patient feels ok. She is eager to have her right drain removed.  Allergies: Patient has no known allergies.  Medications: Prior to Admission medications   Medication Sig Start Date End Date Taking? Authorizing Provider  acetaminophen (TYLENOL) 500 MG tablet Take 2 tablets (1,000 mg total) by mouth every 6 (six) hours. 07/26/19  Yes Calvert Cantor, MD  ALPRAZolam Prudy Feeler) 0.5 MG tablet Take 0.5 mg by mouth 2 (two) times daily as needed for anxiety.  06/30/19  Yes [provider]  Amino Acids-Protein Hydrolys (FEEDING SUPPLEMENT, PRO-STAT SUGAR FREE 64,) LIQD Take 30 mLs by mouth 2 (two) times daily. 07/26/19  Yes Calvert Cantor, MD  aspirin EC 81 MG tablet Take 81 mg by mouth daily.   Yes [provider]  benazepril (LOTENSIN) 20 MG tablet Take 20 mg by mouth daily.   Yes [provider]  bisacodyl (DULCOLAX) 10 MG suppository Place 1 suppository (10 mg total) rectally as needed for moderate constipation. 07/26/19  Yes Calvert Cantor, MD  bisacodyl (DULCOLAX) 5 MG EC tablet Take 2 tablets (10 mg total) by mouth daily as needed for moderate constipation. 07/26/19  Yes Calvert Cantor, MD  busPIRone (BUSPAR) 15 MG tablet Take 15 mg by mouth 2 (two) times daily. 07/06/19  Yes [provider]  busPIRone (BUSPAR) 30 MG tablet Take 1 tablet by mouth 2 (two) times daily. 06/29/19  Yes [provider]  desvenlafaxine (PRISTIQ) 50 MG 24 hr tablet Take 50 mg by mouth daily. 07/04/19  Yes [provider]  Ensure Max Protein (ENSURE MAX PROTEIN) LIQD Take 330 mLs (11 oz total) by mouth 2 (two) times daily. 07/26/19  Yes Calvert Cantor, MD  feeding supplement, ENSURE ENLIVE, (ENSURE ENLIVE) LIQD Take 237 mLs by mouth daily. 07/26/19  Yes Calvert Cantor, MD  gabapentin (NEURONTIN) 300 MG capsule Take 300 mg by mouth 3 (three) times daily.  01/25/18  Yes [provider]  senna (SENOKOT) 8.6 MG TABS tablet Take 2 tablets (17.2 mg total) by mouth at bedtime as needed for mild constipation. 07/26/19  Yes Calvert Cantor, MD  tiZANidine (ZANAFLEX) 4 MG tablet Take 4 mg by mouth every 8 (eight) hours as needed for muscle spasms.  02/04/18  Yes [provider]  topiramate (TOPAMAX) 100 MG tablet Take 100 mg by mouth daily.  06/29/19  Yes [provider]  venlafaxine XR (EFFEXOR-XR) 150 MG 24 hr capsule Take 1 capsule by mouth daily. 06/29/19  Yes [provider]  Vitamin D, Ergocalciferol, (DRISDOL) 1.25 MG (50000 UT) CAPS capsule Take 50,000 Units by mouth once a week. 07/30/19  Yes [provider]  guaiFENesin (MUCINEX) 600 MG 12 hr tablet Take 1 tablet (600 mg total) by mouth 2 (two) times daily as needed. 07/26/19   Calvert Cantor, MD  oxyCODONE (OXY IR/ROXICODONE) 5 MG immediate release tablet Take 1 tablet (5 mg total) by mouth every 6 (six) hours. 07/26/19  Debbe Odea, MD  polyethylene glycol (MIRALAX / GLYCOLAX) 17 g packet Take 17 g by mouth daily. 07/26/19   Debbe Odea, MD     Vital Signs: BP (!) 161/91 (BP Location: Right Arm)   Pulse 66   Temp 98.2 F (36.8 C) (Oral)   Resp 18   Ht 5\' 5"  (1.651 m)   Wt 134.6 kg   LMP 07/11/2019 Comment: not preg  SpO2 97%   BMI 49.39 kg/m   Physical Exam Vitals reviewed.  Constitutional:      Appearance: She is obese.  Cardiovascular:     Rate and Rhythm: Normal rate.  Pulmonary:      Effort: Pulmonary effort is normal. No respiratory distress.  Abdominal:     Palpations: Abdomen is soft.     Comments: Drain in place right lateral abdomen. Removed without difficulty. Patient tolerated well.  Neurological:     General: No focal deficit present.     Mental Status: She is alert and oriented to person, place, and time.  Psychiatric:        Mood and Affect: Mood normal.        Behavior: Behavior normal.        Thought Content: Thought content normal.        Judgment: Judgment normal.     Imaging: No results found.  Labs:  CBC: Recent Labs    07/25/19 0451 08/04/19 0017 08/05/19 0401 08/07/19 0415  WBC 7.4 13.5* 10.9* 6.3  HGB 12.5 13.4 13.5 12.1  HCT 40.8 44.4 46.2* 40.9  PLT 759* 536* 489* 348    COAGS: Recent Labs    07/17/19 1601 07/18/19 0449  INR 1.5* 1.5*    BMP: Recent Labs    07/24/19 0451 08/04/19 0017 08/05/19 0401 08/07/19 0415  NA 136 139 139 136  K 4.5 3.8 4.4 4.1  CL 100 99 102 104  CO2 26 26 26  21*  GLUCOSE 129* 116* 125* 94  BUN 26* 24* 14 10  CALCIUM 9.2 9.2 8.8* 8.7*  CREATININE 0.82 1.17* 1.02* 0.81  GFRNONAA >60 55* >60 >60  GFRAA >60 >60 >60 >60    LIVER FUNCTION TESTS: Recent Labs    07/17/19 1034 07/18/19 0449 07/19/19 0416 07/23/19 0425  BILITOT 0.7 0.6 0.7 0.2*  AST 23 30 48* 23  ALT 14 16 23 27   ALKPHOS 87 79 74 88  PROT 6.8 6.6 6.3* 7.3  ALBUMIN 2.4* 2.2* 2.1* 2.5*    Assessment and Plan:  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 Pascal Lux on 11/25.  2 drains already removed.   Dr. Vernard Gambles reviewed chart and images. Ok to remove drain today.  Done without difficulty.  Sterile dressing placed. Can remove dressing in 24 hours and can shower at that time.  Electronically Signed: Murrell Redden, PA-C 08/08/2019, 3:07 PM    I spent a total of 15 Minutes at the the patient's bedside AND on the patient's hospital floor or  unit, greater than 50% of which was counseling/coordinating care for drain removal.

## 2019-08-08 NOTE — Progress Notes (Signed)
Patient ID: Holly Moon, female   DOB: Nov 17, 1970, 48 y.o.   MRN: [id number]       Subjective: Patient looks much better today.  Her nausea has resolved and she is eager to eat some solid food.  Drinking her ensure.  Mostly with some pain on the right side of her abdomen where her RLQ drain is, which has been the case since her drain was placed and her chronic back pain.  Ostomy pouch stable.  Unable to sleep and wanting something to help with sleep.  ROS: See above, otherwise other systems negative  Objective: Vital signs in last 24 hours: Temp:  [98 F (36.7 C)-98.2 F (36.8 C)] 98 F (36.7 C) (12/15 2028) Pulse Rate:  [65-74] 71 (12/16 0625) Resp:  [16-20] 18 (12/15 2028) BP: (146-184)/(79-98) 159/95 (12/16 0625) SpO2:  [95 %-97 %] 96 % (12/16 0625) Last BM Date: 08/08/19  Intake/Output from previous day: 12/15 0701 - 12/16 0700 In: 2240.5 [P.O.:356; I.V.:1727; IV Piggyback:157.5] Out: 11 [Urine:1; Drains:10] Intake/Output this shift: Total I/O In: 300 [P.O.:300] Out: 300 [Urine:300]  PE: Gen: looks much better today, in good spirits Heart: regular Lungs: CTAB Abd: soft, really nontender, +BS, colostomy pouch in place over CC fistula with some air and liquid stool present.  RLQ drain in place with minimal tan output.  Lab Results:  Recent Labs    08/07/19 0415  WBC 6.3  HGB 12.1  HCT 40.9  PLT 348   BMET Recent Labs    08/07/19 0415  NA 136  K 4.1  CL 104  CO2 21*  GLUCOSE 94  BUN 10  CREATININE 0.81  CALCIUM 8.7*   PT/INR No results for input(s): LABPROT, INR in the last 72 hours. CMP     Component Value Date/Time   NA 136 08/07/2019 0415   K 4.1 08/07/2019 0415   CL 104 08/07/2019 0415   CO2 21 (L) 08/07/2019 0415   GLUCOSE 94 08/07/2019 0415   BUN 10 08/07/2019 0415   CREATININE 0.81 08/07/2019 0415   CALCIUM 8.7 (L) 08/07/2019 0415   PROT 7.3 07/23/2019 0425   ALBUMIN 2.5 (L) 07/23/2019 0425   AST 23 07/23/2019 0425   ALT 27  07/23/2019 0425   ALKPHOS 88 07/23/2019 0425   BILITOT 0.2 (L) 07/23/2019 0425   GFRNONAA >60 08/07/2019 0415   GFRAA >60 08/07/2019 0415   Lipase     Component Value Date/Time   LIPASE 17 07/07/2019 2057       Studies/Results: No results found.  Anti-infectives: Anti-infectives (From admission, onward)   Start     Dose/Rate Route Frequency Ordered Stop   08/08/19 1100  amoxicillin-clavulanate (AUGMENTIN) 875-125 MG per tablet 1 tablet     1 tablet Oral Every 12 hours 08/08/19 1051     08/04/19 0900  piperacillin-tazobactam (ZOSYN) IVPB 3.375 g  Status:  Discontinued     3.375 g 12.5 mL/hr over 240 Minutes Intravenous Every 8 hours 08/04/19 0844 08/08/19 1051   08/04/19 0515  vancomycin (VANCOCIN) 2,000 mg in sodium chloride 0.9 % 500 mL IVPB     2,000 mg 250 mL/hr over 120 Minutes Intravenous  Once 08/04/19 0455 08/04/19 0949   08/04/19 0445  Ampicillin-Sulbactam (UNASYN) 3 g in sodium chloride 0.9 % 100 mL IVPB     3 g 200 mL/hr over 30 Minutes Intravenous  Once 08/04/19 0444 08/04/19 0748       Assessment/Plan COVID 19- positive 11/24, positive 12/12, will complete her 3 week  isolation on 12/17 from first positive test as she is essentially asymptomatic. 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 cont colostomy bag over this site to catch draining stool -I have contacted IR to let them know patient is here as she still has her RLQ drain.  She is asking today when this can come out.  I told her that would be up to IR.  They have not seen her or written a note.  Unclear plan for this drain at this time.  Will have the nurses flushes the drain while she is here. -adv to soft diet today, cont ensure -CBC and BMET are normal.   -given AF and normal WBC and overall lack of pain and  symptoms, will transition from zosyn to augmentin.  In further discussion with patient today, her re-presentation was more because of the drain issue as opposed to worsening symptoms.  This fistula is controlled out of her skin with no current findings of subcutaneous infection. -calorie count, although patient drinking ensure and excited about soft diet today -if continues to do well, can likely plan to DC back home soon. -add ambien for sleep and see how that works.  FEN -soft diet, ensure VTE -Lovenox ID -zosyn DC today, augmentin -->   LOS: 4 days    [name] , [facility] Surgery 08/08/2019, 10:52 AM Please see Amion for pager number during day hours 7:00am-4:30pm

## 2019-08-08 NOTE — Progress Notes (Signed)
   08/08/19 0527  Vitals  BP (!) 184/88  MAP (mmHg) 117  BP Location Right Arm  BP Method Automatic  Patient Position (if appropriate) Lying  MEWS Score  MEWS RR 0  MEWS Pulse 0  MEWS Systolic 0  MEWS LOC 0  MEWS Temp 0  MEWS Score 0  MEWS Score Color Green  pt reports constant mild headache. Notified on-call Dr Lucia Gaskins of pt's elevated BP  to which he stated to pass it on to day shift RN to report  to day shift MD. Will continue to monitor pt.

## 2019-08-09 ENCOUNTER — Other Ambulatory Visit: Payer: BC Managed Care – PPO

## 2019-08-09 MED ORDER — OXYCODONE HCL 5 MG PO TABS: 5.0000 mg | ORAL_TABLET | Freq: Four times a day (QID) | ORAL | 0 refills | Status: DC

## 2019-08-09 MED ORDER — AMOXICILLIN-POT CLAVULANATE 875-125 MG PO TABS: 1.0000 | ORAL_TABLET | Freq: Two times a day (BID) | ORAL | 0 refills | Status: AC

## 2019-08-09 MED ORDER — ZOLPIDEM TARTRATE 5 MG PO TABS: 5.0000 mg | ORAL_TABLET | Freq: Every evening | ORAL | 0 refills | Status: DC | PRN

## 2019-08-09 NOTE — Discharge Instructions (Signed)
Apply pouch over site to catch drainage.  Change every 3 days. Due to be changed again Sunday 08/12/2019  Please call our office if you develop worsening pain, fevers over 101, redness or evidence of infection around your drainage site   Low-Fiber Eating Plan Fiber is found in fruits, vegetables, whole grains, and beans. Eating a diet low in fiber helps to reduce how often you have bowel movements and how much you produce during a bowel movement. A low-fiber eating plan may help your digestive system heal if:  You have certain conditions, such as Crohn's disease or diverticulitis.  You recently had radiation therapy on your pelvis or bowel.  You recently had intestinal surgery.  You have a new surgical opening in your abdomen (colostomy or ileostomy).  Your intestine is narrowed (stricture). Your health care provider will determine how long you need to stay on this diet. Your health care provider may recommend that you work with a diet and nutrition specialist (dietitian). What are tips for following this plan? General guidelines  Follow recommendations from your dietitian about how much fiber you should have each day.  Most people on this eating plan should try to eat less than 10 grams (g) of fiber each day. Your daily fiber goal is _________________ g.  Take vitamin and mineral supplements as told by your health care provider or dietitian. Chewable or liquid forms are best when on this eating plan. Reading food labels  Check food labels for the amount of dietary fiber.  Choose foods that have less than 2 grams of fiber in one serving. Cooking  Use white flour and other allowed grains for baking and cooking.  Cook meat using methods that keep it tender, such as braising or poaching.  Cook eggs until the yolk is completely solid.  Cook with healthy oils, such as olive oil or canola oil. Meal planning   Eat 5-6 small meals throughout the day instead of 3 large meals.  If  you are lactose intolerant: ? Choose low-lactose dairy foods. ? Do not eat dairy foods, if told by your dietitian.  Limit fat and oils to less than 8 teaspoons a day.  Eat small portions of desserts. What foods are allowed? The items listed below may not be a complete list. Talk with your dietitian about what dietary choices are best for you. Grains All bread and crackers made with white flour. Waffles, pancakes, and [place] toast. Bagels. Pretzels. Melba toast, zwieback, and matzoh. Cooked and dried cereals that do not contain whole grains, added fiber, seeds, or dried fruit. CornmealDomenick Gong. Hot and cold cereals made with refined corn, wheat, rice, or oats. Plain pasta and noodles. White rice. Vegetables Well-cooked or canned vegetables without skin, seeds, or stems. Cooked potatoes without skins. Vegetable juice. Fruits Soft-cooked or canned fruits without skin and seeds. Peeled ripe banana. Applesauce. Fruit juice without pulp. Meats and other protein foods Ground meat. Tender cuts of meat or poultry. Eggs. Fish, seafood, and shellfish. Smooth nut butters. Tofu. Dairy All milk products and drinks. Lactose-free milks, including rice, soy, and almond milks. Yogurt without fruit, nuts, chocolate, or granola mix-ins. Sour cream. Cottage cheese. Cheese. Beverages Decaf coffee. Fruit and vegetable juices or smoothies (in small amounts, with no pulp or skins, and with fruits from allowed list). Sports drinks. Herbal tea. Fats and oils Olive oil, canola oil, sunflower oil, flaxseed oil, and grapeseed oil. Mayonnaise. Cream cheese. Margarine. Butter. Sweets and desserts Plain cakes and cookies. Cream pies and pies  made with allowed fruits. Pudding. Custard. Fruit gelatin. Sherbet. Popsicles. Ice cream without nuts. Plain hard candy. Honey. Jelly. Molasses. Syrups, including chocolate syrup. Chocolate. Marshmallows. Gumdrops. Seasoning and other foods Bouillon. Broth. Cream soups made from  allowed foods. Strained soup. Casseroles made with allowed foods. Ketchup. Mild mustard. Mild salad dressings. Plain gravies. Vinegar. Spices in moderation. Salt. Sugar. What foods are not allowed? The items listed below may not be a complete list. Talk with your dietitian about what dietary choices are best for you. Grains Whole wheat and whole grain breads and crackers. Multigrain breads and crackers. Rye bread. Whole grain or multigrain cereals. Cereals with nuts, raisins, or coconut. Bran. Coarse wheat cereals. Granola. High-fiber cereals. Cornmeal or corn bread. Whole grain pasta. Wild or brown rice. Quinoa. Popcorn. Buckwheat. Wheat germ. Vegetables Potato skins. Raw or undercooked vegetables. All beans and bean sprouts. Cooked greens. Corn. Peas. Cabbage. Beets. Broccoli. Brussels sprouts. Cauliflower. Mushrooms. Onions. Peppers. Parsnips. Okra. Sauerkraut. Fruit Raw or dried fruit. Berries. Fruit juice with pulp. Prune juice. Meats and other protein foods Tough, fibrous meats with gristle. Fatty meat. Poultry with skin. Fried meat, Environmental education officer, or fish. Deli or lunch meats. Sausage, bacon, and hot dogs. Nuts and chunky nut butter. Dried peas, beans, and lentils. Dairy Yogurt with fruit, nuts, chocolate, or granola mix-ins. Beverages Caffeinated coffee and teas. Fats and oils Avocado. Coconut. Sweets and desserts Desserts, cookies, or candies that contain nuts or coconut. Dried fruit. Jams and preserves with seeds. Marmalade. Any dessert made with fruits or grains that are not allowed. Seasoning and other foods Corn tortilla chips. Soups made with vegetables or grains that are not allowed. Relish. Horseradish. Rosita Fire. Olives. Summary  Most people on a low-fiber eating plan should eat less than 10 grams of fiber a day. Follow recommendations from your dietitian about how much fiber you should have each day.  Always check food labels to see the dietary fiber content of packaged foods. In  general, a low-fiber food will have fewer than 2 grams of fiber per serving.  In general, try to avoid whole grains, raw fruits and vegetables, dried fruit, tough cuts of meat, nuts, and seeds.  Take a vitamin and mineral supplement as told by your health care provider or dietitian. This information is not intended to replace advice given to you by your health care provider. Make sure you discuss any questions you have with your health care provider. Document Released: 01/29/2002 Document Revised: 12/01/2018 Document Reviewed: 10/12/2016 Elsevier Patient Education  2020 [employer].

## 2019-08-09 NOTE — Progress Notes (Signed)
NUTRITION NOTE  Consult received for 48 hour Calorie Count.  Patient is currently on Soft diet and consumed 80% of dinner last night and 85% of breakfast this AM.  Discharge order and discharge summary for discharge home entered earlier this AM; Calorie Count not completed due to this.   If patient unable to discharge for any reason today, will complete Calorie Count and full assessment tomorrow (12/17).    Jarome Matin, MS, RD, LDN, [facility] Clinical Dietitian Pager # [phone] After hours/weekend pager # [phone]

## 2019-08-09 NOTE — Discharge Summary (Signed)
Patient ID: Holly Moon [id number] 1971/03/26 48 y.o.  Admit date: 08/03/2019 Discharge date: 08/09/2019  Admitting Diagnosis: Diverticulitis with colocutaneous fistula COVID 19 3 weeks ago  Discharge Diagnosis Patient Active Problem List   Diagnosis Date Noted  . Cellulitis 08/04/2019  . Hypoxia   . Intra-abdominal abscess (HCC)   . Intraabdominal fluid collection   . Perforation of sigmoid colon due to diverticulitis 07/21/2019  . COVID-19 virus infection 07/21/2019  . Sinus bradycardia 07/21/2019  . Severe sepsis (HCC) 07/21/2019  . Diverticulitis 07/08/2019  . History of carpal tunnel release 02/17/2018  . History of anxiety 02/17/2018  . History of hypertension 02/17/2018  . DDD (degenerative disc disease), lumbar 02/17/2018  . History of pulmonary embolism 02/17/2018    Consultants IR  Reason for Admission: 48 y.o. F with h/o previous admission for perforated colon and subsequent covid infection with multiple intra-abdominal abscesses managed by IR drains currently.  She reports increasing abd pain and worsening erythema and drainage around LLQ catheter.  No fevers or nausea.    Procedures none  Hospital Course:  The patient was admitted and placed on zosyn.  IR was consulted secondary to feculent drainage from around her anterior drain.  This was removed, but her RLQ drain remained in place at that time.  Once her drain was removed, she did continue to have feculent output so a colostomy pouch was placed over this site to catch the drainage.  Her diet was slowly advanced as tolerated.  Ultimately, her RLQ was able to be removed prior to discharge secondary to low output.  On HD 4, she was transitioned from her zosyn to oral augmentin with no leukocytosis or fever.  She was tolerating a low fiber diet and on HD 5, she was felt stable for DC home after teach for how to place the colostomy pouch over her fistula site.  She will continue on 7 more days of  augmentin and instructed on what to look for in regards to infection around this fistula site.  Also as of HD 5, she is 3 weeks out from her COVID diagnosis and able to come off of precautions.  Physical Exam: Heart: regular Lungs: CTAB Abd: soft, NT, ND, obese, RLQ drain site clean and covered.  Anterior drain site with CC fistula with air and liquid stool present.  Allergies as of 08/09/2019   No Known Allergies     Medication List    TAKE these medications   acetaminophen 500 MG tablet Commonly known as: TYLENOL Take 2 tablets (1,000 mg total) by mouth every 6 (six) hours.   ALPRAZolam 0.5 MG tablet Commonly known as: XANAX Take 0.5 mg by mouth 2 (two) times daily as needed for anxiety.   amoxicillin-clavulanate 875-125 MG tablet Commonly known as: AUGMENTIN Take 1 tablet by mouth every 12 (twelve) hours for 7 days.   aspirin EC 81 MG tablet Take 81 mg by mouth daily.   benazepril 20 MG tablet Commonly known as: LOTENSIN Take 20 mg by mouth daily.   bisacodyl 10 MG suppository Commonly known as: Dulcolax Place 1 suppository (10 mg total) rectally as needed for moderate constipation.   bisacodyl 5 MG EC tablet Commonly known as: DULCOLAX Take 2 tablets (10 mg total) by mouth daily as needed for moderate constipation.   busPIRone 30 MG tablet Commonly known as: BUSPAR Take 1 tablet by mouth 2 (two) times daily.   busPIRone 15 MG tablet Commonly known as: BUSPAR Take 15 mg by  mouth 2 (two) times daily.   desvenlafaxine 50 MG 24 hr tablet Commonly known as: PRISTIQ Take 50 mg by mouth daily.   feeding supplement (ENSURE ENLIVE) Liqd Take 237 mLs by mouth daily.   Ensure Max Protein Liqd Take 330 mLs (11 oz total) by mouth 2 (two) times daily.   feeding supplement (PRO-STAT SUGAR FREE 64) Liqd Take 30 mLs by mouth 2 (two) times daily.   gabapentin 300 MG capsule Commonly known as: NEURONTIN Take 300 mg by mouth 3 (three) times daily.   guaiFENesin 600  MG 12 hr tablet Commonly known as: MUCINEX Take 1 tablet (600 mg total) by mouth 2 (two) times daily as needed.   oxyCODONE 5 MG immediate release tablet Commonly known as: Oxy IR/ROXICODONE Take 1 tablet (5 mg total) by mouth every 6 (six) hours.   polyethylene glycol 17 g packet Commonly known as: MIRALAX / GLYCOLAX Take 17 g by mouth daily.   senna 8.6 MG Tabs tablet Commonly known as: SENOKOT Take 2 tablets (17.2 mg total) by mouth at bedtime as needed for mild constipation.   tiZANidine 4 MG tablet Commonly known as: ZANAFLEX Take 4 mg by mouth every 8 (eight) hours as needed for muscle spasms.   topiramate 100 MG tablet Commonly known as: TOPAMAX Take 100 mg by mouth daily.   venlafaxine XR 150 MG 24 hr capsule Commonly known as: EFFEXOR-XR Take 1 capsule by mouth daily.   Vitamin D (Ergocalciferol) 1.25 MG (50000 UT) Caps capsule Commonly known as: DRISDOL Take 50,000 Units by mouth once a week.   zolpidem 5 MG tablet Commonly known as: AMBIEN Take 1 tablet (5 mg total) by mouth at bedtime as needed for sleep.        Follow-up Information    Leighton Ruff, MD Follow up on 08/27/2019.   Specialty: General Surgery Why: Please go to your already scheduled appointment in January at 11:20am.  please arrive by 11:00am Contact information: [address] [place] [address] [phone]           Signed: Saverio Danker, [facility] Surgery 08/09/2019, 11:05 AM Please see Amion for pager number during day hours 7:00am-4:30pm

## 2019-08-09 NOTE — Progress Notes (Signed)
Pt discharged today per [facility]. Pt's IV site D/C'd and site WDL. PT VSS. Pt provided with home medication list, discharge instructions and prescriptions. Verbalizes understanding. Pt left floor via WC in stable condition accompained by Jeliyah NT.

## 2019-08-27 ENCOUNTER — Other Ambulatory Visit: Payer: Self-pay | Admitting: General Surgery

## 2019-08-27 DIAGNOSIS — K631 Perforation of intestine (nontraumatic): Secondary | ICD-10-CM

## 2019-08-27 DIAGNOSIS — K5720 Diverticulitis of large intestine with perforation and abscess without bleeding: Secondary | ICD-10-CM

## 2019-10-02 ENCOUNTER — Other Ambulatory Visit: Payer: BC Managed Care – PPO

## 2019-10-02 ENCOUNTER — Ambulatory Visit
Admission: RE | Admit: 2019-10-02 | Discharge: 2019-10-02 | Disposition: A | Discharge status: Home / Self Care | Payer: BC Managed Care – PPO | Source: Ambulatory Visit | Attending: Surgery | Admitting: Surgery

## 2019-10-02 DIAGNOSIS — K5720 Diverticulitis of large intestine with perforation and abscess without bleeding: Secondary | ICD-10-CM

## 2019-10-02 MED ORDER — IOPAMIDOL (ISOVUE-300) INJECTION 61%
100.0000 mL | Freq: Once | INTRAVENOUS | Status: AC | PRN
  Administered 2019-10-02: 13:00:00 100 mL via INTRAVENOUS

## 2019-10-30 ENCOUNTER — Other Ambulatory Visit: Payer: Self-pay | Admitting: Sports Medicine

## 2019-10-30 DIAGNOSIS — M545 Low back pain, unspecified: Secondary | ICD-10-CM

## 2019-10-30 DIAGNOSIS — G8929 Other chronic pain: Secondary | ICD-10-CM

## 2019-11-09 ENCOUNTER — Other Ambulatory Visit: Payer: Self-pay | Admitting: Physician Assistant

## 2019-11-09 DIAGNOSIS — Z1231 Encounter for screening mammogram for malignant neoplasm of breast: Secondary | ICD-10-CM

## 2019-11-12 ENCOUNTER — Other Ambulatory Visit: Payer: Self-pay

## 2019-11-12 ENCOUNTER — Ambulatory Visit
Admission: RE | Admit: 2019-11-12 | Discharge: 2019-11-12 | Disposition: A | Discharge status: Home / Self Care | Payer: BC Managed Care – PPO | Source: Ambulatory Visit | Attending: Sports Medicine | Admitting: Sports Medicine

## 2019-11-12 DIAGNOSIS — M545 Low back pain, unspecified: Secondary | ICD-10-CM

## 2019-11-12 DIAGNOSIS — G8929 Other chronic pain: Secondary | ICD-10-CM

## 2019-11-12 MED ORDER — METHYLPREDNISOLONE ACETATE 40 MG/ML INJ SUSP (RADIOLOG
120.0000 mg | Freq: Once | INTRAMUSCULAR | Status: AC
  Administered 2019-11-12: 120 mg via EPIDURAL

## 2019-11-12 MED ORDER — IOPAMIDOL (ISOVUE-M 200) INJECTION 41%
1.0000 mL | Freq: Once | INTRAMUSCULAR | Status: AC
  Administered 2019-11-12: 1 mL via EPIDURAL

## 2019-11-12 NOTE — Discharge Instructions (Signed)

## 2019-11-28 ENCOUNTER — Ambulatory Visit
Admission: RE | Admit: 2019-11-28 | Discharge: 2019-11-28 | Disposition: A | Discharge status: Home / Self Care | Payer: BC Managed Care – PPO | Source: Ambulatory Visit | Attending: Physician Assistant | Admitting: Physician Assistant

## 2019-11-28 ENCOUNTER — Other Ambulatory Visit: Payer: Self-pay

## 2019-11-28 DIAGNOSIS — Z1231 Encounter for screening mammogram for malignant neoplasm of breast: Secondary | ICD-10-CM

## 2019-12-03 ENCOUNTER — Other Ambulatory Visit: Payer: Self-pay | Admitting: Gastroenterology

## 2020-01-08 ENCOUNTER — Other Ambulatory Visit (HOSPITAL_COMMUNITY): Payer: BC Managed Care – PPO

## 2020-01-10 ENCOUNTER — Ambulatory Visit (HOSPITAL_COMMUNITY)
Admission: RE | Admit: 2020-01-10 | Discharge: 2020-01-10 | Disposition: A | Discharge status: Home / Self Care | Payer: BC Managed Care – PPO | Source: Ambulatory Visit | Attending: Student | Admitting: Student

## 2020-01-10 ENCOUNTER — Other Ambulatory Visit: Payer: Self-pay

## 2020-01-10 ENCOUNTER — Other Ambulatory Visit: Payer: Self-pay | Admitting: Student

## 2020-01-10 ENCOUNTER — Other Ambulatory Visit (HOSPITAL_COMMUNITY): Payer: Self-pay | Admitting: Student

## 2020-01-10 DIAGNOSIS — R109 Unspecified abdominal pain: Secondary | ICD-10-CM | POA: Diagnosis not present

## 2020-01-10 MED ORDER — IOHEXOL 9 MG/ML PO SOLN
ORAL | Status: AC
  Filled 2020-01-10: qty 1000

## 2020-01-10 MED ORDER — IOHEXOL 300 MG/ML  SOLN
100.0000 mL | Freq: Once | INTRAMUSCULAR | Status: AC | PRN
  Administered 2020-01-10: 100 mL via INTRAVENOUS

## 2020-01-10 MED ORDER — IOHEXOL 9 MG/ML PO SOLN: 500.0000 mL | ORAL | Status: AC

## 2020-01-10 MED ORDER — SODIUM CHLORIDE (PF) 0.9 % IJ SOLN
INTRAMUSCULAR | Status: AC
  Filled 2020-01-10: qty 50

## 2020-01-29 ENCOUNTER — Other Ambulatory Visit (HOSPITAL_COMMUNITY): Payer: BC Managed Care – PPO

## 2020-02-01 ENCOUNTER — Ambulatory Visit (HOSPITAL_COMMUNITY): Admit: 2020-02-01 | Payer: BC Managed Care – PPO | Admitting: Gastroenterology

## 2020-02-01 ENCOUNTER — Encounter (HOSPITAL_COMMUNITY): Payer: Self-pay

## 2020-02-01 SURGERY — COLONOSCOPY WITH PROPOFOL
Anesthesia: Monitor Anesthesia Care

## 2020-03-05 ENCOUNTER — Other Ambulatory Visit: Payer: Self-pay | Admitting: Sports Medicine

## 2020-03-05 DIAGNOSIS — M545 Low back pain, unspecified: Secondary | ICD-10-CM

## 2020-03-05 DIAGNOSIS — G8929 Other chronic pain: Secondary | ICD-10-CM

## 2020-03-17 ENCOUNTER — Ambulatory Visit
Admission: RE | Admit: 2020-03-17 | Discharge: 2020-03-17 | Disposition: A | Discharge status: Home / Self Care | Payer: BC Managed Care – PPO | Source: Ambulatory Visit | Attending: Sports Medicine | Admitting: Sports Medicine

## 2020-03-17 DIAGNOSIS — M545 Low back pain, unspecified: Secondary | ICD-10-CM

## 2020-03-17 DIAGNOSIS — G8929 Other chronic pain: Secondary | ICD-10-CM

## 2020-03-17 MED ORDER — IOPAMIDOL (ISOVUE-M 200) INJECTION 41%
1.0000 mL | Freq: Once | INTRAMUSCULAR | Status: AC
  Administered 2020-03-17: 1 mL via EPIDURAL

## 2020-03-17 MED ORDER — METHYLPREDNISOLONE ACETATE 40 MG/ML INJ SUSP (RADIOLOG
120.0000 mg | Freq: Once | INTRAMUSCULAR | Status: AC
  Administered 2020-03-17: 120 mg via EPIDURAL

## 2020-03-17 NOTE — Discharge Instructions (Signed)

## 2020-05-05 IMAGING — CT CT ABD-PELV W/ CM
2 of 5 series · 15 of 46 positions shown, 17 images · IV contrast (omnipaque)
Comparison: 07/25/2019

CLINICAL DATA: History of diverticulitis with increased left lower
quadrant pain.

EXAM:
CT ABDOMEN AND PELVIS WITH CONTRAST
TECHNIQUE: Multidetector CT imaging of the abdomen and pelvis was performed
using the standard protocol following bolus administration of
intravenous contrast.
CONTRAST:  100mL OMNIPAQUE IOHEXOL 300 MG/ML  SOLN

[Series 2: axial st · axial · 0.89mm/px · z∈[-472,-32]mm · 12 of 104 slices shown, 14 images]
[im 8/104  soft-tissue]
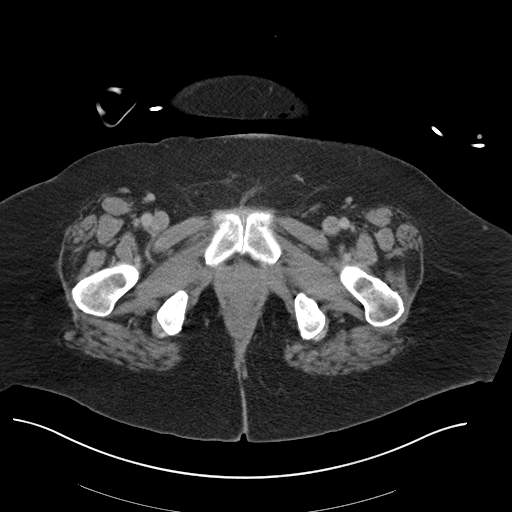
[im 8/104  bone]
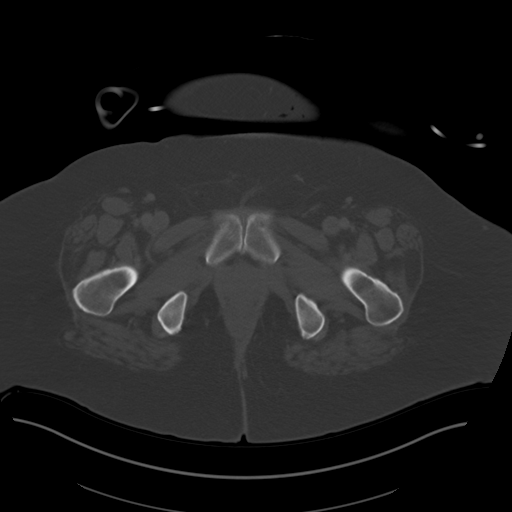
[im 16/104  soft-tissue]
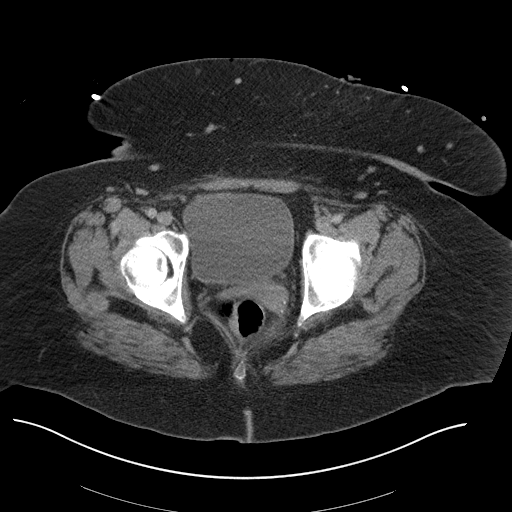
[im 24/104  soft-tissue]
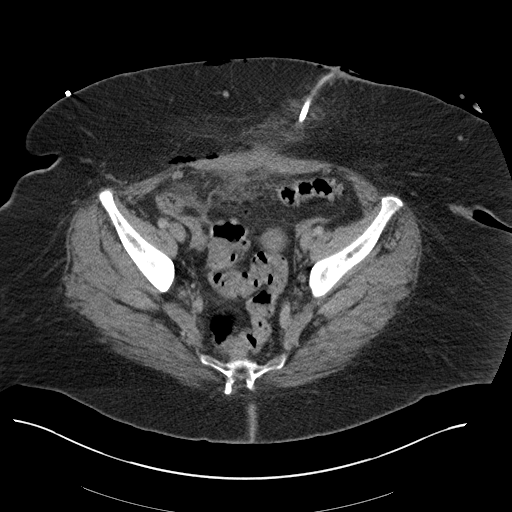
[im 32/104  soft-tissue]
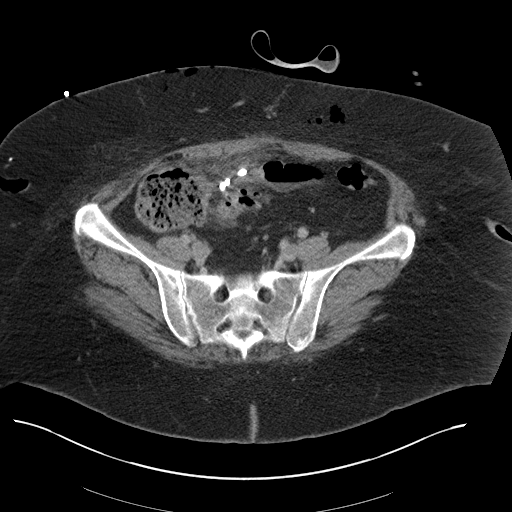
[im 40/104  soft-tissue]
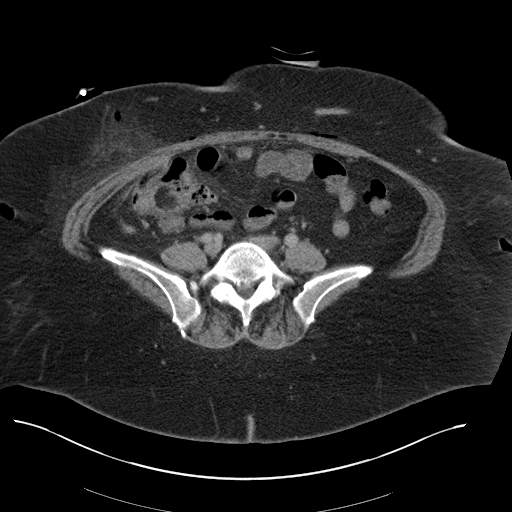
[im 48/104  soft-tissue]
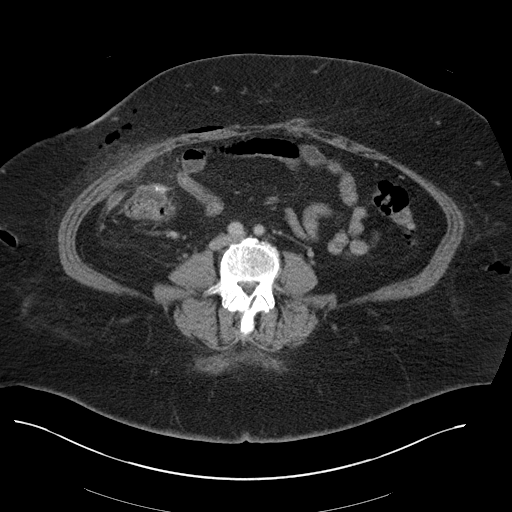
[im 56/104  soft-tissue]
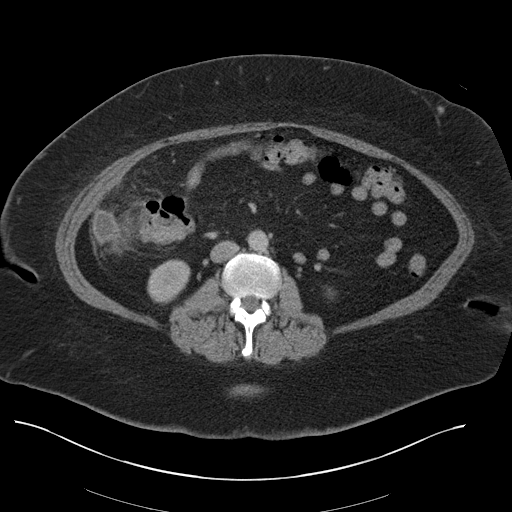
[im 64/104  soft-tissue]
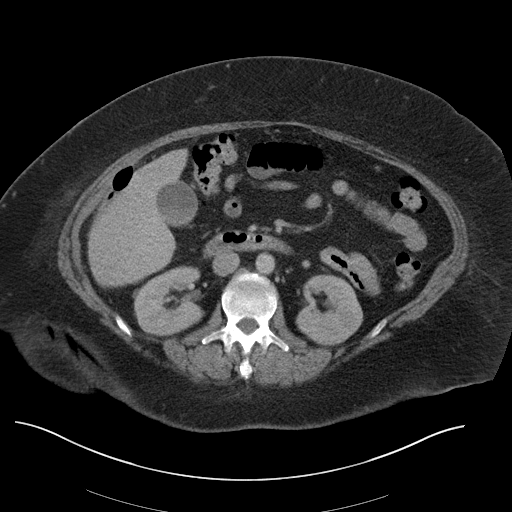
[im 72/104  soft-tissue]
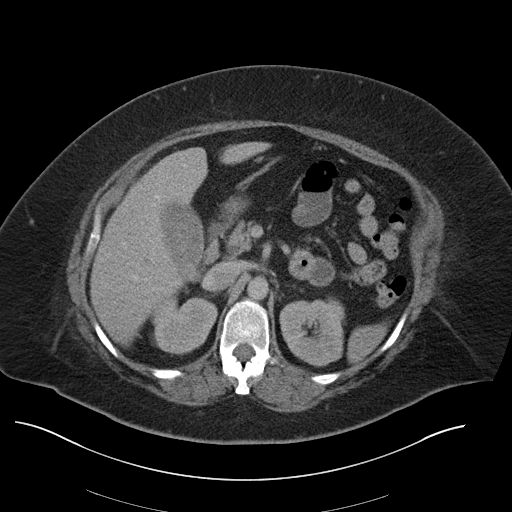
[im 72/104  bone]
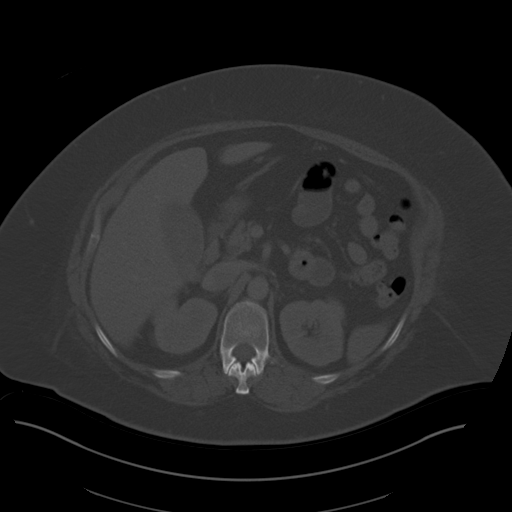
[im 80/104  soft-tissue]
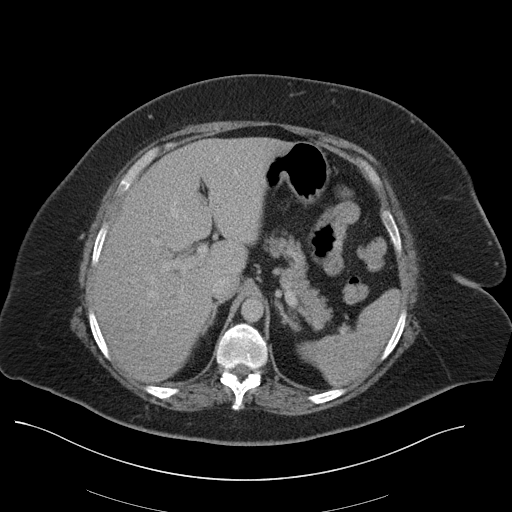
[im 88/104  soft-tissue]
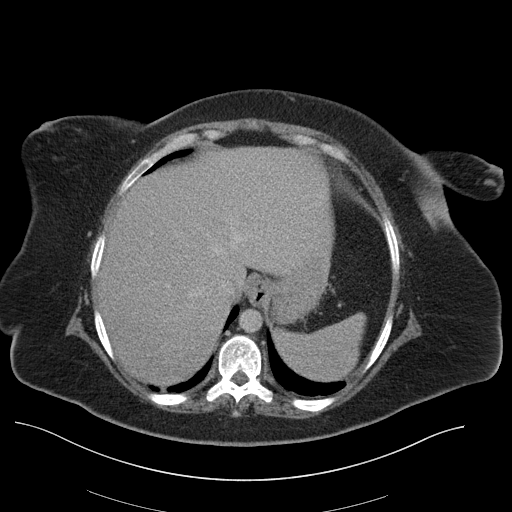
[im 96/104  soft-tissue]
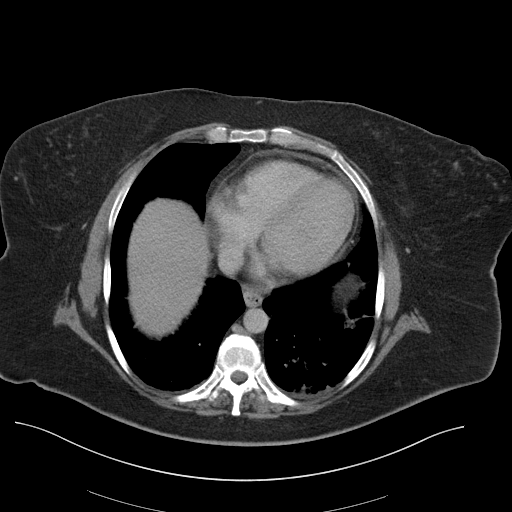

[Series 5: coronal st · coronal · 0.98mm/px · 3 of 158 slices shown]
[im 53/158  soft-tissue]
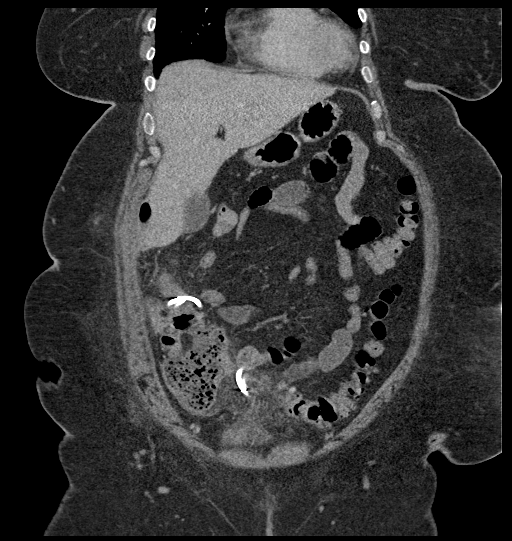
[im 70/158  soft-tissue]
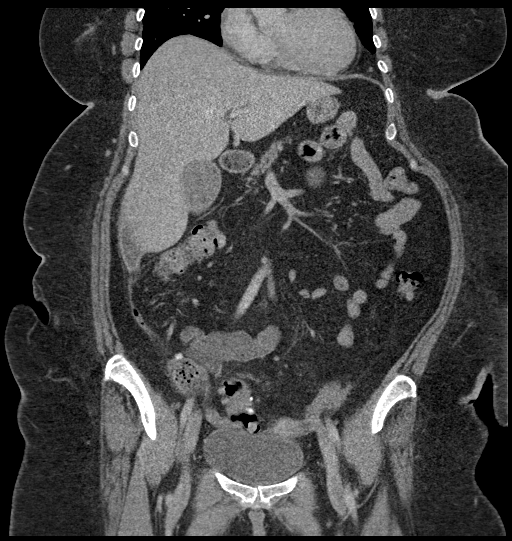
[im 88/158  soft-tissue]
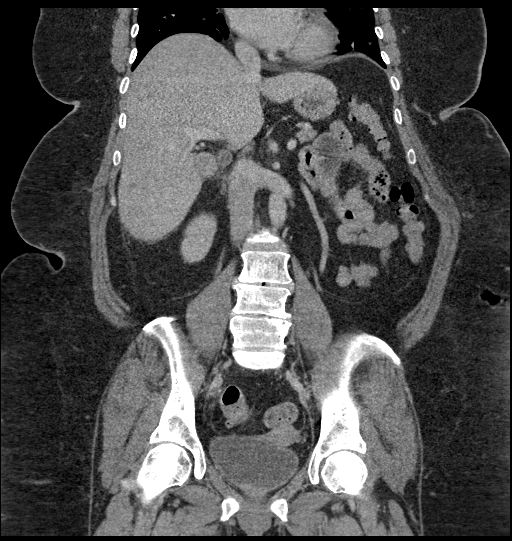

[15 of 46 positions shown; findings below may reference images not displayed]

FINDINGS: Lower chest: Patchy infiltrate is again identified within the bases
bilaterally but slightly improved consistent with improving COVID
pneumonia. No effusion is seen.

Hepatobiliary: Gallbladder is well distended. Multiple dependent
stones are noted which are poorly calcified. The liver is within
normal limits.

Pancreas: Unremarkable. No pancreatic ductal dilatation or
surrounding inflammatory changes.

Spleen: Normal in size without focal abnormality.

Adrenals/Urinary Tract: Adrenal glands are within normal limits. The
kidneys are well visualized bilaterally. No renal calculi or
obstructive changes are seen. The bladder is partially distended.

Stomach/Bowel: Diverticular change of the colon is noted. No
evidence of diverticulitis. The appendix is within normal limits.
Small bowel is unremarkable. Stomach is within normal limits.

Vascular/Lymphatic: No significant vascular findings are present. No
enlarged abdominal or pelvic lymph nodes.

Reproductive: Uterus and bilateral adnexa are unremarkable.

Other: There are 2 pigtail drainage catheter is identified within
the abdomen. One lies adjacent to the cecum anteriorly with a
decompressed collection. The second lies slightly inferior adjacent
to loops of sigmoid anteriorly stable in appearance from the prior
exam. No residual collection is noted. Scattered areas of
subcutaneous air are noted as well as some subcutaneous inflammatory
changes surrounding the drainage catheters at both levels. No focal
subcutaneous abscess is seen.

Air and fluid collection is noted along the inferior liver tip which
now measures 6.0 x 1.6 x 6.5 cm. This is slightly increased when
compared with the prior exam at which time it measured 4.0 x 2.7 cm.
The previously seen drainage catheter has been removed in the
interval. No free fluid is noted within the pelvis.

Musculoskeletal: Degenerative changes within the lumbar spine.
IMPRESSION: Two drainage catheters are noted within the abdomen stable from the
prior exam without significant residual collections. Mild
inflammatory changes are noted surrounding the catheter is in their
subcutaneous course without evidence of focal abscess.

Increase in the air-fluid collection adjacent to the tip of the
liver as described above.

Diverticulosis without diverticulitis.

Persistent patchy infiltrate in the bases bilaterally although
improved from the prior exam consistent with resolving [id number]
pneumonia.

Cholelithiasis without complicating factors.

## 2020-05-29 ENCOUNTER — Emergency Department (HOSPITAL_COMMUNITY): Payer: BC Managed Care – PPO

## 2020-05-29 ENCOUNTER — Inpatient Hospital Stay (HOSPITAL_COMMUNITY)
Admission: EM | Admit: 2020-05-29 | Discharge: 2020-06-09 | DRG: 166 | Disposition: A | Discharge status: Home / Self Care | Payer: BC Managed Care – PPO | Attending: Internal Medicine | Admitting: Internal Medicine

## 2020-05-29 ENCOUNTER — Encounter (HOSPITAL_COMMUNITY): Payer: Self-pay

## 2020-05-29 DIAGNOSIS — I2609 Other pulmonary embolism with acute cor pulmonale: Secondary | ICD-10-CM | POA: Diagnosis not present

## 2020-05-29 DIAGNOSIS — M7989 Other specified soft tissue disorders: Secondary | ICD-10-CM

## 2020-05-29 DIAGNOSIS — E1165 Type 2 diabetes mellitus with hyperglycemia: Secondary | ICD-10-CM | POA: Diagnosis present

## 2020-05-29 DIAGNOSIS — F17210 Nicotine dependence, cigarettes, uncomplicated: Secondary | ICD-10-CM | POA: Diagnosis present

## 2020-05-29 DIAGNOSIS — I82442 Acute embolism and thrombosis of left tibial vein: Secondary | ICD-10-CM | POA: Diagnosis present

## 2020-05-29 DIAGNOSIS — Z86718 Personal history of other venous thrombosis and embolism: Secondary | ICD-10-CM | POA: Diagnosis not present

## 2020-05-29 DIAGNOSIS — I82432 Acute embolism and thrombosis of left popliteal vein: Secondary | ICD-10-CM | POA: Diagnosis present

## 2020-05-29 DIAGNOSIS — N179 Acute kidney failure, unspecified: Secondary | ICD-10-CM | POA: Diagnosis present

## 2020-05-29 DIAGNOSIS — I2602 Saddle embolus of pulmonary artery with acute cor pulmonale: Secondary | ICD-10-CM | POA: Diagnosis present

## 2020-05-29 DIAGNOSIS — Z8659 Personal history of other mental and behavioral disorders: Secondary | ICD-10-CM | POA: Diagnosis not present

## 2020-05-29 DIAGNOSIS — I749 Embolism and thrombosis of unspecified artery: Secondary | ICD-10-CM

## 2020-05-29 DIAGNOSIS — I82409 Acute embolism and thrombosis of unspecified deep veins of unspecified lower extremity: Secondary | ICD-10-CM | POA: Diagnosis present

## 2020-05-29 DIAGNOSIS — Z887 Allergy status to serum and vaccine status: Secondary | ICD-10-CM

## 2020-05-29 DIAGNOSIS — G40109 Localization-related (focal) (partial) symptomatic epilepsy and epileptic syndromes with simple partial seizures, not intractable, without status epilepticus: Secondary | ICD-10-CM | POA: Diagnosis present

## 2020-05-29 DIAGNOSIS — M549 Dorsalgia, unspecified: Secondary | ICD-10-CM | POA: Diagnosis not present

## 2020-05-29 DIAGNOSIS — R519 Headache, unspecified: Secondary | ICD-10-CM | POA: Diagnosis present

## 2020-05-29 DIAGNOSIS — I10 Essential (primary) hypertension: Secondary | ICD-10-CM | POA: Diagnosis present

## 2020-05-29 DIAGNOSIS — I2721 Secondary pulmonary arterial hypertension: Secondary | ICD-10-CM | POA: Diagnosis present

## 2020-05-29 DIAGNOSIS — K589 Irritable bowel syndrome without diarrhea: Secondary | ICD-10-CM | POA: Diagnosis present

## 2020-05-29 DIAGNOSIS — E1142 Type 2 diabetes mellitus with diabetic polyneuropathy: Secondary | ICD-10-CM | POA: Diagnosis present

## 2020-05-29 DIAGNOSIS — I2692 Saddle embolus of pulmonary artery without acute cor pulmonale: Secondary | ICD-10-CM

## 2020-05-29 DIAGNOSIS — I248 Other forms of acute ischemic heart disease: Secondary | ICD-10-CM | POA: Diagnosis present

## 2020-05-29 DIAGNOSIS — I2699 Other pulmonary embolism without acute cor pulmonale: Secondary | ICD-10-CM

## 2020-05-29 DIAGNOSIS — R52 Pain, unspecified: Secondary | ICD-10-CM

## 2020-05-29 DIAGNOSIS — Z20822 Contact with and (suspected) exposure to covid-19: Secondary | ICD-10-CM | POA: Diagnosis present

## 2020-05-29 DIAGNOSIS — J189 Pneumonia, unspecified organism: Secondary | ICD-10-CM

## 2020-05-29 DIAGNOSIS — F419 Anxiety disorder, unspecified: Secondary | ICD-10-CM | POA: Diagnosis present

## 2020-05-29 DIAGNOSIS — Z7982 Long term (current) use of aspirin: Secondary | ICD-10-CM

## 2020-05-29 DIAGNOSIS — I82402 Acute embolism and thrombosis of unspecified deep veins of left lower extremity: Secondary | ICD-10-CM | POA: Diagnosis not present

## 2020-05-29 DIAGNOSIS — Z6841 Body Mass Index (BMI) 40.0 and over, adult: Secondary | ICD-10-CM

## 2020-05-29 DIAGNOSIS — Z713 Dietary counseling and surveillance: Secondary | ICD-10-CM

## 2020-05-29 DIAGNOSIS — R000 Tachycardia, unspecified: Secondary | ICD-10-CM | POA: Diagnosis present

## 2020-05-29 DIAGNOSIS — Z79891 Long term (current) use of opiate analgesic: Secondary | ICD-10-CM

## 2020-05-29 DIAGNOSIS — M25531 Pain in right wrist: Secondary | ICD-10-CM | POA: Diagnosis not present

## 2020-05-29 DIAGNOSIS — G8929 Other chronic pain: Secondary | ICD-10-CM | POA: Diagnosis present

## 2020-05-29 DIAGNOSIS — F329 Major depressive disorder, single episode, unspecified: Secondary | ICD-10-CM | POA: Diagnosis present

## 2020-05-29 DIAGNOSIS — J9601 Acute respiratory failure with hypoxia: Secondary | ICD-10-CM | POA: Diagnosis not present

## 2020-05-29 DIAGNOSIS — I824Y2 Acute embolism and thrombosis of unspecified deep veins of left proximal lower extremity: Secondary | ICD-10-CM

## 2020-05-29 DIAGNOSIS — Z86711 Personal history of pulmonary embolism: Secondary | ICD-10-CM | POA: Diagnosis not present

## 2020-05-29 DIAGNOSIS — Z79899 Other long term (current) drug therapy: Secondary | ICD-10-CM

## 2020-05-29 DIAGNOSIS — Z716 Tobacco abuse counseling: Secondary | ICD-10-CM

## 2020-05-29 DIAGNOSIS — R739 Hyperglycemia, unspecified: Secondary | ICD-10-CM | POA: Diagnosis not present

## 2020-05-29 DIAGNOSIS — Z8616 Personal history of COVID-19: Secondary | ICD-10-CM

## 2020-05-29 HISTORY — DX: COVID-19: U07.1

## 2020-05-29 LAB — COMPREHENSIVE METABOLIC PANEL
ALT: 17 U/L (ref 0–44)
AST: 18 U/L (ref 15–41)
Albumin: 3.3 g/dL — ABNORMAL LOW (ref 3.5–5.0)
Alkaline Phosphatase: 76 U/L (ref 38–126)
Anion gap: 11 (ref 5–15)
BUN: 24 mg/dL — ABNORMAL HIGH (ref 6–20)
CO2: 22 mmol/L (ref 22–32)
Calcium: 9.1 mg/dL (ref 8.9–10.3)
Chloride: 106 mmol/L (ref 98–111)
Creatinine, Ser: 0.96 mg/dL (ref 0.44–1.00)
GFR calc non Af Amer: >60 mL/min (ref >60)
Glucose, Bld: 206 mg/dL — ABNORMAL HIGH (ref 70–99)
Potassium: 4.4 mmol/L (ref 3.5–5.1)
Sodium: 139 mmol/L (ref 135–145)
Total Bilirubin: 0.4 mg/dL (ref 0.3–1.2)
Total Protein: 6.8 g/dL (ref 6.5–8.1)

## 2020-05-29 LAB — URINALYSIS, ROUTINE W REFLEX MICROSCOPIC
Bacteria, UA: NONE SEEN
Bilirubin Urine: NEGATIVE
Glucose, UA: NEGATIVE mg/dL
Ketones, ur: NEGATIVE mg/dL
Leukocytes,Ua: NEGATIVE
Nitrite: NEGATIVE
Protein, ur: 30 mg/dL — AB
RBC / HPF: >50 RBC/hpf — ABNORMAL HIGH (ref 0–5)
Specific Gravity, Urine: 1.021 (ref 1.005–1.030)
pH: 5 (ref 5.0–8.0)

## 2020-05-29 LAB — CBC WITH DIFFERENTIAL/PLATELET
Abs Immature Granulocytes: 0.04 10*3/uL (ref 0.00–0.07)
Basophils Absolute: 0.1 10*3/uL (ref 0.0–0.1)
Basophils Relative: 1 %
Eosinophils Absolute: 0.2 10*3/uL (ref 0.0–0.5)
Eosinophils Relative: 2 %
HCT: 47 % — ABNORMAL HIGH (ref 36.0–46.0)
Hemoglobin: 14.5 g/dL (ref 12.0–15.0)
Immature Granulocytes: 0 %
Lymphocytes Relative: 20 %
Lymphs Abs: 2.2 10*3/uL (ref 0.7–4.0)
MCH: 27.5 pg (ref 26.0–34.0)
MCHC: 30.9 g/dL (ref 30.0–36.0)
MCV: 89.2 fL (ref 80.0–100.0)
Monocytes Absolute: 0.5 10*3/uL (ref 0.1–1.0)
Monocytes Relative: 4 %
Neutro Abs: 8.1 10*3/uL — ABNORMAL HIGH (ref 1.7–7.7)
Neutrophils Relative %: 73 %
Platelets: 333 10*3/uL (ref 150–400)
RBC: 5.27 MIL/uL — ABNORMAL HIGH (ref 3.87–5.11)
RDW: 15.2 % (ref 11.5–15.5)
WBC: 11.1 10*3/uL — ABNORMAL HIGH (ref 4.0–10.5)
nRBC: 0 % (ref 0.0–0.2)

## 2020-05-29 LAB — RESPIRATORY PANEL BY RT PCR (FLU A&B, COVID)
Influenza A by PCR: NEGATIVE
Influenza B by PCR: NEGATIVE
SARS Coronavirus 2 by RT PCR: NEGATIVE

## 2020-05-29 LAB — D-DIMER, QUANTITATIVE: D-Dimer, Quant: 3.03 ug/mL-FEU — ABNORMAL HIGH (ref 0.00–0.50)

## 2020-05-29 LAB — TROPONIN I (HIGH SENSITIVITY): Troponin I (High Sensitivity): 441 ng/L (ref <18)

## 2020-05-29 MED ORDER — SODIUM CHLORIDE 0.9 % IV SOLN: INTRAVENOUS | Status: DC

## 2020-05-29 MED ORDER — LORAZEPAM 2 MG/ML IJ SOLN
0.5000 mg | Freq: Once | INTRAMUSCULAR | Status: AC
  Administered 2020-05-29: 0.5 mg via INTRAVENOUS
  Filled 2020-05-29: qty 1
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
## 2020-05-29 NOTE — Progress Notes (Signed)
ANTICOAGULATION CONSULT NOTE - Initial Consult  Pharmacy Consult for Heparin Indication: DVT  No Known Allergies  Patient Measurements: Height: 5\' 6"  (167.6 cm) Weight: (!) 154.2 kg (340 lb) IBW/kg (Calculated) : 59.3 Heparin Dosing Weight: 100 kg  Vital Signs: Temp: 98.6 F (37 C) (10/07 1717) Temp Source: Oral (10/07 1717) BP: 167/112 (10/07 2230) Pulse Rate: 121 (10/07 2230)  Labs: Recent Labs    05/29/20 1713 05/29/20 2019  HGB 14.5  --   HCT 47.0*  --   PLT 333  --   CREATININE 0.96  --   TROPONINIHS  --  441*    Estimated Creatinine Clearance: 110.1 mL/min (by C-G formula based on SCr of 0.96 mg/dL).   Medical History: Past Medical History:  Diagnosis Date  . COVID-19   . Hypertension 2015  . IBS (irritable bowel syndrome)   . Pulmonary embolism (HCC)     Medications:  No current facility-administered medications on file prior to encounter.   Current Outpatient Medications on File Prior to Encounter  Medication Sig Dispense Refill  . acetaminophen (TYLENOL) 500 MG tablet Take 2 tablets (1,000 mg total) by mouth every 6 (six) hours. 30 tablet 0  . ALPRAZolam (XANAX) 0.5 MG tablet Take 0.5 mg by mouth 2 (two) times daily as needed for anxiety.     . Amino Acids-Protein Hydrolys (FEEDING SUPPLEMENT, PRO-STAT SUGAR FREE 64,) LIQD Take 30 mLs by mouth 2 (two) times daily. 887 mL 0  . ARIPiprazole (ABILIFY) 5 MG tablet Take 5 mg by mouth daily.    07/29/20 aspirin EC 81 MG tablet Take 81 mg by mouth daily.    . benazepril (LOTENSIN) 20 MG tablet Take 20 mg by mouth daily.    . bisacodyl (DULCOLAX) 10 MG suppository Place 1 suppository (10 mg total) rectally as needed for moderate constipation. 12 suppository 0  . bisacodyl (DULCOLAX) 5 MG EC tablet Take 2 tablets (10 mg total) by mouth daily as needed for moderate constipation. 30 tablet 0  . busPIRone (BUSPAR) 15 MG tablet Take 15 mg by mouth 2 (two) times daily.    . busPIRone (BUSPAR) 30 MG tablet Take 1 tablet  by mouth 2 (two) times daily.    Marland Kitchen desvenlafaxine (PRISTIQ) 50 MG 24 hr tablet Take 50 mg by mouth daily.    . Ensure Max Protein (ENSURE MAX PROTEIN) LIQD Take 330 mLs (11 oz total) by mouth 2 (two) times daily. 330 mL 30  . feeding supplement, ENSURE ENLIVE, (ENSURE ENLIVE) LIQD Take 237 mLs by mouth daily. 237 mL 12  . gabapentin (NEURONTIN) 300 MG capsule Take 300 mg by mouth 3 (three) times daily.   0  . guaiFENesin (MUCINEX) 600 MG 12 hr tablet Take 1 tablet (600 mg total) by mouth 2 (two) times daily as needed. 60 tablet 0  . HYDROcodone-acetaminophen (NORCO/VICODIN) 5-325 MG tablet Take by mouth.    . naproxen (NAPROSYN) 500 MG tablet TAKE 1 TABLET(500 MG) BY MOUTH TWICE DAILY AS NEEDED    . ondansetron (ZOFRAN) 8 MG tablet Take 8 mg by mouth 3 (three) times daily.    Marland Kitchen oxyCODONE (OXY IR/ROXICODONE) 5 MG immediate release tablet Take 1 tablet (5 mg total) by mouth every 6 (six) hours. 20 tablet 0  . polyethylene glycol (MIRALAX / GLYCOLAX) 17 g packet Take 17 g by mouth daily. 14 each 0  . senna (SENOKOT) 8.6 MG TABS tablet Take 2 tablets (17.2 mg total) by mouth at bedtime as needed for mild constipation.  120 tablet 0  . tiZANidine (ZANAFLEX) 4 MG tablet Take 4 mg by mouth every 8 (eight) hours as needed for muscle spasms.   0  . topiramate (TOPAMAX) 100 MG tablet Take 100 mg by mouth daily.     . traMADol (ULTRAM) 50 MG tablet Take 50-100 mg by mouth every 6 (six) hours as needed.    . venlafaxine XR (EFFEXOR-XR) 150 MG 24 hr capsule Take 1 capsule by mouth daily.    . Vitamin D, Ergocalciferol, (DRISDOL) 1.25 MG (50000 UT) CAPS capsule Take 50,000 Units by mouth once a week.    . zolpidem (AMBIEN) 5 MG tablet Take 1 tablet (5 mg total) by mouth at bedtime as needed for sleep. 10 tablet 0  . zonisamide (ZONEGRAN) 100 MG capsule Take by mouth.       Assessment: 49 y.o. female with SOB, confirmed LLE DVT and possible PE, for heparin Goal of Therapy:  Heparin level 0.3-0.7  units/ml Monitor platelets by anticoagulation protocol: Yes   Plan:  Heparin 6000 units IV bolus, then start heparin 1800 units/hr Check heparin level in 8 hours.   Jahziah Simonin, Gary Fleet 05/29/2020,11:55 PM

## 2020-05-29 NOTE — ED Notes (Signed)
Pt to [facility] for urine collection; reports acute SOB; says this happens when she stands up; placed on [place] 6L.

## 2020-05-29 NOTE — ED Notes (Signed)
Date and time results received: 05/29/20 2113 (use smartphrase ".now" to insert current time)  Test: Troponin Critical Value: 441  Name of Provider Notified: Belfi  Orders Received? Or Actions Taken?: Continue to monitor
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
## 2020-05-29 NOTE — Progress Notes (Signed)
Lower extremity venous LT study completed.  Preliminary results relayed to MD.   See CV Proc for preliminary results report.   Jean Rosenthal, RDMS

## 2020-05-29 NOTE — Progress Notes (Signed)
The patient had of isovue 350 extravasation right antecubital Dr Marisa Sprinkles assessed patient. Iv was removed, Arm was elevated and verbal instructions were given to the patient, verbal instructions were given to the patient, verbal handoff was given to the RN. Extravasation discharge orders were placed.

## 2020-05-30 ENCOUNTER — Inpatient Hospital Stay (HOSPITAL_COMMUNITY): Payer: BC Managed Care – PPO

## 2020-05-30 ENCOUNTER — Other Ambulatory Visit: Payer: Self-pay

## 2020-05-30 DIAGNOSIS — I2609 Other pulmonary embolism with acute cor pulmonale: Secondary | ICD-10-CM | POA: Diagnosis not present

## 2020-05-30 DIAGNOSIS — I82402 Acute embolism and thrombosis of unspecified deep veins of left lower extremity: Secondary | ICD-10-CM | POA: Diagnosis not present

## 2020-05-30 DIAGNOSIS — G8929 Other chronic pain: Secondary | ICD-10-CM | POA: Diagnosis present

## 2020-05-30 LAB — HEMOGLOBIN A1C
Hgb A1c MFr Bld: 6.7 % — ABNORMAL HIGH (ref 4.8–5.6)
Mean Plasma Glucose: 145.59 mg/dL

## 2020-05-30 LAB — MAGNESIUM: Magnesium: 1.9 mg/dL (ref 1.7–2.4)

## 2020-05-30 LAB — ECHOCARDIOGRAM COMPLETE
Height: 66 in
S' Lateral: 2.4 cm
Weight: 5638.4 oz

## 2020-05-30 LAB — BASIC METABOLIC PANEL
Anion gap: 12 (ref 5–15)
BUN: 21 mg/dL — ABNORMAL HIGH (ref 6–20)
CO2: 20 mmol/L — ABNORMAL LOW (ref 22–32)
Calcium: 8.8 mg/dL — ABNORMAL LOW (ref 8.9–10.3)
Chloride: 104 mmol/L (ref 98–111)
Creatinine, Ser: 0.92 mg/dL (ref 0.44–1.00)
GFR calc non Af Amer: >60 mL/min (ref >60)
Glucose, Bld: 172 mg/dL — ABNORMAL HIGH (ref 70–99)
Potassium: 4 mmol/L (ref 3.5–5.1)
Sodium: 136 mmol/L (ref 135–145)

## 2020-05-30 LAB — I-STAT BETA HCG BLOOD, ED (MC, WL, AP ONLY): I-stat hCG, quantitative: <5 m[IU]/mL (ref <5)

## 2020-05-30 LAB — PROTIME-INR
INR: 1 (ref 0.8–1.2)
Prothrombin Time: 13.1 seconds (ref 11.4–15.2)

## 2020-05-30 LAB — GLUCOSE, CAPILLARY
Glucose-Capillary: 157 mg/dL — ABNORMAL HIGH (ref 70–99)
Glucose-Capillary: 158 mg/dL — ABNORMAL HIGH (ref 70–99)
Glucose-Capillary: 139 mg/dL — ABNORMAL HIGH (ref 70–99)
Glucose-Capillary: 197 mg/dL — ABNORMAL HIGH (ref 70–99)

## 2020-05-30 LAB — CBC
HCT: 46.5 % — ABNORMAL HIGH (ref 36.0–46.0)
Hemoglobin: 14.4 g/dL (ref 12.0–15.0)
MCH: 27 pg (ref 26.0–34.0)
MCHC: 31 g/dL (ref 30.0–36.0)
MCV: 87.2 fL (ref 80.0–100.0)
Platelets: 362 10*3/uL (ref 150–400)
RBC: 5.33 MIL/uL — ABNORMAL HIGH (ref 3.87–5.11)
RDW: 15.2 % (ref 11.5–15.5)
WBC: 12.9 10*3/uL — ABNORMAL HIGH (ref 4.0–10.5)
nRBC: 0 % (ref 0.0–0.2)

## 2020-05-30 LAB — BRAIN NATRIURETIC PEPTIDE: B Natriuretic Peptide: 210.1 pg/mL — ABNORMAL HIGH (ref 0.0–100.0)

## 2020-05-30 LAB — TROPONIN I (HIGH SENSITIVITY): Troponin I (High Sensitivity): 376 ng/L (ref <18)

## 2020-05-30 MED ORDER — WARFARIN SODIUM 10 MG PO TABS
10.0000 mg | ORAL_TABLET | Freq: Once | ORAL | Status: AC
  Administered 2020-05-30: 10 mg via ORAL
  Filled 2020-05-30: qty 1

## 2020-05-30 MED ORDER — HEPARIN BOLUS VIA INFUSION
6000.0000 [IU] | Freq: Once | INTRAVENOUS | Status: AC
  Administered 2020-05-30: 6000 [IU] via INTRAVENOUS
  Filled 2020-05-30: qty 6000

## 2020-05-30 MED ORDER — ZONISAMIDE 100 MG PO CAPS
100.0000 mg | ORAL_CAPSULE | Freq: Every day | ORAL | Status: DC
  Administered 2020-05-30 – 2020-06-09 (×11): 100 mg via ORAL
  Filled 2020-05-30 (×12): qty 1

## 2020-05-30 MED ORDER — POLYETHYLENE GLYCOL 3350 17 G PO PACK: 17.0000 g | PACK | Freq: Every day | ORAL | Status: DC | PRN

## 2020-05-30 MED ORDER — HEPARIN (PORCINE) 25000 UT/250ML-% IV SOLN
1800.0000 [IU]/h | INTRAVENOUS | Status: AC
  Administered 2020-05-30: 1800 [IU]/h via INTRAVENOUS
  Filled 2020-05-30: qty 250

## 2020-05-30 MED ORDER — RIVAROXABAN 15 MG PO TABS
15.0000 mg | ORAL_TABLET | Freq: Two times a day (BID) | ORAL | Status: DC
  Administered 2020-05-30 – 2020-06-01 (×4): 15 mg via ORAL
  Filled 2020-05-30 (×5): qty 1

## 2020-05-30 MED ORDER — ACETAMINOPHEN 325 MG PO TABS
650.0000 mg | ORAL_TABLET | Freq: Four times a day (QID) | ORAL | Status: DC | PRN
  Administered 2020-05-30: 650 mg via ORAL
  Filled 2020-05-30: qty 2

## 2020-05-30 MED ORDER — ACETAMINOPHEN 650 MG RE SUPP: 650.0000 mg | Freq: Four times a day (QID) | RECTAL | Status: DC | PRN

## 2020-05-30 MED ORDER — WARFARIN - PHARMACIST DOSING INPATIENT: Freq: Every day | Status: DC

## 2020-05-30 MED ORDER — PERFLUTREN LIPID MICROSPHERE
1.0000 mL | INTRAVENOUS | Status: AC | PRN
  Administered 2020-05-30: 3 mL via INTRAVENOUS
  Filled 2020-05-30: qty 10

## 2020-05-30 MED ORDER — IOHEXOL 350 MG/ML SOLN
100.0000 mL | Freq: Once | INTRAVENOUS | Status: AC | PRN
  Administered 2020-05-30: 100 mL via INTRAVENOUS

## 2020-05-30 MED ORDER — VENLAFAXINE HCL ER 75 MG PO CP24
150.0000 mg | ORAL_CAPSULE | Freq: Every day | ORAL | Status: DC
  Administered 2020-05-30 – 2020-06-09 (×10): 150 mg via ORAL
  Filled 2020-05-30 (×5): qty 2
  Filled 2020-05-30 (×2): qty 1
  Filled 2020-05-30 (×8): qty 2

## 2020-05-30 MED ORDER — APIXABAN 5 MG PO TABS: ORAL_TABLET | ORAL | 0 refills | Status: DC

## 2020-05-30 MED ORDER — SODIUM CHLORIDE 0.9 % IV SOLN: INTRAVENOUS | Status: DC

## 2020-05-30 MED ORDER — RIVAROXABAN 20 MG PO TABS: 20.0000 mg | ORAL_TABLET | Freq: Every day | ORAL | Status: DC

## 2020-05-30 MED ORDER — GABAPENTIN 300 MG PO CAPS
600.0000 mg | ORAL_CAPSULE | Freq: Two times a day (BID) | ORAL | Status: DC
  Administered 2020-05-30 – 2020-06-09 (×21): 600 mg via ORAL
  Filled 2020-05-30 (×21): qty 2

## 2020-05-30 MED ORDER — METFORMIN HCL 500 MG PO TABS
500.0000 mg | ORAL_TABLET | Freq: Two times a day (BID) | ORAL | Status: DC
  Administered 2020-05-30 – 2020-06-02 (×6): 500 mg via ORAL
  Filled 2020-05-30 (×6): qty 1

## 2020-05-30 MED ORDER — ONDANSETRON HCL 4 MG/2ML IJ SOLN
4.0000 mg | Freq: Four times a day (QID) | INTRAMUSCULAR | Status: DC | PRN
  Administered 2020-06-01: 4 mg via INTRAVENOUS
  Filled 2020-05-30: qty 2

## 2020-05-30 MED ORDER — GABAPENTIN 300 MG PO CAPS: 600.0000 mg | ORAL_CAPSULE | ORAL | Status: DC

## 2020-05-30 MED ORDER — INSULIN ASPART 100 UNIT/ML ~~LOC~~ SOLN
0.0000 [IU] | Freq: Three times a day (TID) | SUBCUTANEOUS | Status: DC
  Administered 2020-05-30: 2 [IU] via SUBCUTANEOUS
  Administered 2020-05-30 (×2): 3 [IU] via SUBCUTANEOUS
  Administered 2020-05-31 (×3): 2 [IU] via SUBCUTANEOUS
  Administered 2020-06-01 (×2): 1 [IU] via SUBCUTANEOUS
  Administered 2020-06-01 – 2020-06-02 (×2): 2 [IU] via SUBCUTANEOUS
  Administered 2020-06-02: 1 [IU] via SUBCUTANEOUS
  Administered 2020-06-03 (×3): 2 [IU] via SUBCUTANEOUS
  Administered 2020-06-04: 1 [IU] via SUBCUTANEOUS
  Administered 2020-06-04: 2 [IU] via SUBCUTANEOUS
  Administered 2020-06-04: 1 [IU] via SUBCUTANEOUS
  Administered 2020-06-05: 2 [IU] via SUBCUTANEOUS
  Administered 2020-06-05: 1 [IU] via SUBCUTANEOUS
  Administered 2020-06-05 – 2020-06-06 (×2): 2 [IU] via SUBCUTANEOUS
  Administered 2020-06-06 – 2020-06-09 (×8): 1 [IU] via SUBCUTANEOUS

## 2020-05-30 MED ORDER — ARIPIPRAZOLE 2 MG PO TABS
5.0000 mg | ORAL_TABLET | Freq: Every day | ORAL | Status: DC
  Administered 2020-05-30 – 2020-06-09 (×11): 5 mg via ORAL
  Filled 2020-05-30 (×2): qty 3
  Filled 2020-05-30 (×2): qty 1
  Filled 2020-05-30 (×3): qty 3
  Filled 2020-05-30: qty 1
  Filled 2020-05-30: qty 3
  Filled 2020-05-30: qty 1
  Filled 2020-05-30: qty 3

## 2020-05-30 MED ORDER — INSULIN ASPART 100 UNIT/ML ~~LOC~~ SOLN: 0.0000 [IU] | Freq: Every day | SUBCUTANEOUS | Status: DC

## 2020-05-30 MED ORDER — SODIUM CHLORIDE 0.9% FLUSH
3.0000 mL | Freq: Two times a day (BID) | INTRAVENOUS | Status: DC
  Administered 2020-05-31 – 2020-06-08 (×6): 3 mL via INTRAVENOUS

## 2020-05-30 MED ORDER — ONDANSETRON HCL 4 MG PO TABS: 4.0000 mg | ORAL_TABLET | Freq: Four times a day (QID) | ORAL | Status: DC | PRN

## 2020-05-30 MED ORDER — ALPRAZOLAM 0.5 MG PO TABS
0.5000 mg | ORAL_TABLET | Freq: Three times a day (TID) | ORAL | Status: DC
  Administered 2020-05-30 – 2020-06-09 (×31): 0.5 mg via ORAL
  Filled 2020-05-30 (×31): qty 1

## 2020-05-30 MED ORDER — HYDROCODONE-ACETAMINOPHEN 5-325 MG PO TABS
1.0000 | ORAL_TABLET | ORAL | Status: DC | PRN
  Administered 2020-05-30: 2 via ORAL
  Administered 2020-05-30: 1 via ORAL
  Administered 2020-05-30: 2 via ORAL
  Filled 2020-05-30 (×2): qty 2
  Filled 2020-05-30: qty 1

## 2020-05-30 MED ORDER — SODIUM CHLORIDE 0.9% FLUSH: 3.0000 mL | INTRAVENOUS | Status: DC | PRN

## 2020-05-30 MED ORDER — GABAPENTIN 300 MG PO CAPS
900.0000 mg | ORAL_CAPSULE | Freq: Every day | ORAL | Status: DC
  Administered 2020-05-30 – 2020-06-08 (×10): 900 mg via ORAL
  Filled 2020-05-30: qty 3
  Filled 2020-05-30: qty 9
  Filled 2020-05-30 (×8): qty 3

## 2020-05-30 MED ORDER — BUSPIRONE HCL 10 MG PO TABS
30.0000 mg | ORAL_TABLET | Freq: Three times a day (TID) | ORAL | Status: DC
  Administered 2020-05-30 – 2020-06-09 (×30): 30 mg via ORAL
  Filled 2020-05-30 (×2): qty 3
  Filled 2020-05-30: qty 6
  Filled 2020-05-30: qty 3
  Filled 2020-05-30: qty 2
  Filled 2020-05-30 (×6): qty 3
  Filled 2020-05-30: qty 6
  Filled 2020-05-30: qty 3
  Filled 2020-05-30: qty 2
  Filled 2020-05-30: qty 3
  Filled 2020-05-30 (×2): qty 2
  Filled 2020-05-30: qty 3
  Filled 2020-05-30: qty 6
  Filled 2020-05-30: qty 2
  Filled 2020-05-30 (×11): qty 3

## 2020-05-30 MED ORDER — SODIUM CHLORIDE 0.9 % IV SOLN: 250.0000 mL | INTRAVENOUS | Status: DC | PRN

## 2020-05-30 MED ORDER — ENOXAPARIN SODIUM 150 MG/ML ~~LOC~~ SOLN
150.0000 mg | Freq: Two times a day (BID) | SUBCUTANEOUS | Status: DC
  Administered 2020-05-30: 150 mg via SUBCUTANEOUS
  Filled 2020-05-30 (×2): qty 1

## 2020-05-30 MED ORDER — TIZANIDINE HCL 4 MG PO TABS
4.0000 mg | ORAL_TABLET | Freq: Three times a day (TID) | ORAL | Status: DC | PRN
  Administered 2020-05-31: 4 mg via ORAL
  Filled 2020-05-30 (×2): qty 1

## 2020-05-30 MED ORDER — SODIUM CHLORIDE 0.9% FLUSH
3.0000 mL | Freq: Two times a day (BID) | INTRAVENOUS | Status: DC
  Administered 2020-05-30 – 2020-06-08 (×6): 3 mL via INTRAVENOUS

## 2020-05-30 MED ORDER — RIVAROXABAN 15 MG PO TABS: 15.0000 mg | ORAL_TABLET | Freq: Two times a day (BID) | ORAL | Status: DC
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
## 2020-05-30 NOTE — Progress Notes (Signed)
  Echocardiogram 2D Echocardiogram has been performed.  Delcie Roch 05/30/2020, 11:12 AM

## 2020-05-30 NOTE — Plan of Care (Signed)
°  Problem: Coping: °Goal: Level of anxiety will decrease °Outcome: Progressing °  °

## 2020-05-30 NOTE — Progress Notes (Signed)
Patient arrived to the unit with a yellow mews. VS are being taken every 4 hours as well as frequent assessment. Patient doesn't appear to be in any distress.

## 2020-05-30 NOTE — ED Notes (Signed)
Pt admitted to 3E27; report called to [facility], RN; pt resting quietly at this time.

## 2020-05-30 NOTE — Plan of Care (Signed)

## 2020-05-30 NOTE — Progress Notes (Signed)
Trialed patient on BiPAP with mild settings for comfort.  Pt immediately began complaining of "burning" sensation in her nares.  Stated it felt "like acid".  BIPAP placed on standby at bedside and pt placed back on nasal cannula.

## 2020-05-30 NOTE — Progress Notes (Signed)
ANTICOAGULATION CONSULT NOTE - Initial Consult  Pharmacy Consult for Coumadin/Lovenox Indication: pulmonary embolus  Allergies  Allergen Reactions  . Covid-19 Mrna Vacc (Moderna) Swelling and Other (See Comments)    Arm developed blisters and a sunburned appearance and throat/tonsils were swollen the next morning    Patient Measurements: Height: 5\' 6"  (167.6 cm) Weight: (!) 159.8 kg (352 lb 6.4 oz) (scale b) IBW/kg (Calculated) : 59.3 Heparin Dosing Weight: 100kg  Vital Signs: Temp: 98.4 F (36.9 C) (10/08 0806) Temp Source: Oral (10/08 0806) BP: 124/78 (10/08 0806) Pulse Rate: 95 (10/08 0806)  Labs: Recent Labs    05/29/20 1713 05/29/20 2019 05/29/20 2356 05/30/20 0257  HGB 14.5  --   --  14.4  HCT 47.0*  --   --  46.5*  PLT 333  --   --  362  CREATININE 0.96  --   --  0.92  TROPONINIHS  --  441* 376*  --     Estimated Creatinine Clearance: 117.5 mL/min (by C-G formula based on SCr of 0.92 mg/dL).   Medical History: Past Medical History:  Diagnosis Date  . COVID-19   . Hypertension 2015  . IBS (irritable bowel syndrome)   . Pulmonary embolism (HCC)     Medications:  Medications Prior to Admission  Medication Sig Dispense Refill Last Dose  . ALPRAZolam (XANAX) 0.5 MG tablet Take 0.5 mg by mouth 3 (three) times daily.    05/29/2020 at am  . ARIPiprazole (ABILIFY) 5 MG tablet Take 5 mg by mouth daily.   05/29/2020 at am  . aspirin EC 81 MG tablet Take 81 mg by mouth daily.   05/29/2020 at 1100  . benazepril (LOTENSIN) 20 MG tablet Take 20 mg by mouth daily.   05/29/2020 at Unknown time  . busPIRone (BUSPAR) 30 MG tablet Take 30 mg by mouth 3 (three) times daily.    05/29/2020 at am  . desvenlafaxine (PRISTIQ) 100 MG 24 hr tablet Take 100 mg by mouth daily.   05/29/2020 at am  . gabapentin (NEURONTIN) 300 MG capsule Take 600-900 mg by mouth See admin instructions. Take 600 mg by mouth in the morning, 600 mg midday, and 900 mg at bedtime  0 05/29/2020 at am  .  naproxen (NAPROSYN) 500 MG tablet Take 500 mg by mouth 2 (two) times daily as needed (for pain and/or inflammation).    Past Week at Unknown time  . ondansetron (ZOFRAN) 8 MG tablet Take 8 mg by mouth every 8 (eight) hours as needed for nausea or vomiting.    unk at 07/29/2020  . tiZANidine (ZANAFLEX) 4 MG tablet Take 4 mg by mouth every 8 (eight) hours as needed for muscle spasms.   0 unk at unk  . traMADol (ULTRAM) 50 MG tablet Take 50-100 mg by mouth See admin instructions. Take 50-100 mg by mouth one to two times a day as needed for pain   05/28/2020 at Unknown time  . zonisamide (ZONEGRAN) 100 MG capsule Take 100 mg by mouth daily.    05/29/2020 at Unknown time   Scheduled:  . ALPRAZolam  0.5 mg Oral TID  . ARIPiprazole  5 mg Oral Daily  . busPIRone  30 mg Oral TID  . enoxaparin (LOVENOX) injection  150 mg Subcutaneous BID  . gabapentin  600 mg Oral BID   And  . gabapentin  900 mg Oral QHS  . insulin aspart  0-5 Units Subcutaneous QHS  . insulin aspart  0-9 Units Subcutaneous TID WC  .  metFORMIN  500 mg Oral BID WC  . sodium chloride flush  3 mL Intravenous Q12H  . sodium chloride flush  3 mL Intravenous Q12H  . venlafaxine XR  150 mg Oral Q breakfast  . warfarin  10 mg Oral ONCE-1600  . Warfarin - Pharmacist Dosing Inpatient   Does not apply q1600  . zonisamide  100 mg Oral Daily   Infusions:  . sodium chloride    . sodium chloride    . heparin Stopped (05/30/20 0753)    Assessment: Morbidly obese pt who was admitted for a new PE. She has a prior history of it in 2018 and is off of anticoagulation. She was started on heparin but has had some IV issue so we will change it to SQ lovenox after discussing with Dr Mahala Menghini. We are also going to start her on coumadin.   Goal of Therapy:  INR 2-3 Anti-Xa level 0.6-1 units/ml 4hrs after LMWH dose given Monitor platelets by anticoagulation protocol: Yes   Plan:  Baseline INR then daily Dc heparin Lovenox 150mg  SQ q12 Coumadin 10mg  PO  x1  , PharmD, [place], AAHIVP, CPP Infectious Disease Pharmacist 05/30/2020 9:53 AM
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
## 2020-05-31 DIAGNOSIS — I82402 Acute embolism and thrombosis of unspecified deep veins of left lower extremity: Secondary | ICD-10-CM | POA: Diagnosis not present

## 2020-05-31 LAB — GLUCOSE, CAPILLARY
Glucose-Capillary: 160 mg/dL — ABNORMAL HIGH (ref 70–99)
Glucose-Capillary: 160 mg/dL — ABNORMAL HIGH (ref 70–99)
Glucose-Capillary: 194 mg/dL — ABNORMAL HIGH (ref 70–99)
Glucose-Capillary: 160 mg/dL — ABNORMAL HIGH (ref 70–99)

## 2020-05-31 LAB — CBC
HCT: 43.3 % (ref 36.0–46.0)
Hemoglobin: 13.3 g/dL (ref 12.0–15.0)
MCH: 27.1 pg (ref 26.0–34.0)
MCHC: 30.7 g/dL (ref 30.0–36.0)
MCV: 88.2 fL (ref 80.0–100.0)
Platelets: 307 10*3/uL (ref 150–400)
RBC: 4.91 MIL/uL (ref 3.87–5.11)
RDW: 15.4 % (ref 11.5–15.5)
WBC: 9.5 10*3/uL (ref 4.0–10.5)
nRBC: 0 % (ref 0.0–0.2)

## 2020-05-31 LAB — BASIC METABOLIC PANEL
Anion gap: 10 (ref 5–15)
BUN: 16 mg/dL (ref 6–20)
CO2: 21 mmol/L — ABNORMAL LOW (ref 22–32)
Calcium: 8.2 mg/dL — ABNORMAL LOW (ref 8.9–10.3)
Chloride: 106 mmol/L (ref 98–111)
Creatinine, Ser: 0.75 mg/dL (ref 0.44–1.00)
GFR, Estimated: >60 mL/min (ref >60)
Glucose, Bld: 143 mg/dL — ABNORMAL HIGH (ref 70–99)
Potassium: 4.1 mmol/L (ref 3.5–5.1)
Sodium: 137 mmol/L (ref 135–145)

## 2020-05-31 LAB — PROTIME-INR
INR: 1.6 — ABNORMAL HIGH (ref 0.8–1.2)
Prothrombin Time: 18.6 seconds — ABNORMAL HIGH (ref 11.4–15.2)

## 2020-05-31 MED ORDER — IBUPROFEN 200 MG PO TABS
400.0000 mg | ORAL_TABLET | Freq: Four times a day (QID) | ORAL | Status: DC
  Administered 2020-05-31 – 2020-06-01 (×5): 400 mg via ORAL
  Filled 2020-05-31 (×4): qty 2

## 2020-05-31 MED ORDER — METOPROLOL SUCCINATE ER 25 MG PO TB24
12.5000 mg | ORAL_TABLET | Freq: Every day | ORAL | Status: DC
  Administered 2020-05-31 – 2020-06-01 (×2): 12.5 mg via ORAL
  Filled 2020-05-31 (×2): qty 1

## 2020-05-31 MED ORDER — GUAIFENESIN 100 MG/5ML PO SOLN
5.0000 mL | ORAL | Status: DC | PRN
  Administered 2020-05-31 – 2020-06-03 (×3): 100 mg via ORAL
  Filled 2020-05-31 (×3): qty 5

## 2020-05-31 MED ORDER — HYDROCODONE-ACETAMINOPHEN 5-325 MG PO TABS
1.0000 | ORAL_TABLET | Freq: Four times a day (QID) | ORAL | Status: DC | PRN
  Administered 2020-06-01 – 2020-06-08 (×10): 1 via ORAL
  Filled 2020-05-31 (×10): qty 1

## 2020-05-31 NOTE — Progress Notes (Signed)
Messaged md for something for congestion ordered robutussin and metoprolol for elevated HR,her heart rate went up to 150's sustained she tried to walk to sink and stand take a bath. Patient then told me no way could walk with heart rate like that MD aware.

## 2020-05-31 NOTE — Progress Notes (Signed)
Patient with elevated hr with bedside bath when standing at sink and walking briefly short distance to the sink in room. MD aware of HR elevation as well as MEWS elevation to yellow ordered Metoprolol for rate control.

## 2020-05-31 NOTE — Progress Notes (Signed)
   05/30/20 2328  Assess: MEWS Score  Temp 98.7 F (37.1 C)  BP 127/80  Pulse Rate (!) 101  Resp 20  SpO2 93 %  Assess: MEWS Score  MEWS Temp 0  MEWS Systolic 0  MEWS Pulse 1  MEWS RR 0  MEWS LOC 0  MEWS Score 1  MEWS Score Color Green  Treat  Pain Scale 0-10  Pain Score 1  Pain Type Acute pain  Pain Location Back  Pain Descriptors / Indicators Aching  Pain Frequency Constant  Pain Onset On-going  Multiple Pain Sites Yes  Notify: Provider  Provider Name/Title G,Shalhoub  Date Provider Notified 05/30/20  Time Provider Notified 2338  Notification Type Page  Response See new orders  Date of Provider Response 05/30/20  Time of Provider Response 2345  Document  Patient Outcome Stabilized after interventions

## 2020-05-31 NOTE — Progress Notes (Signed)
   05/30/20 2204  Assess: MEWS Score  BP (!) 164/84  Pulse Rate 97  Resp (!) 21  SpO2 94 %  O2 Device Nasal Cannula  O2 Flow Rate (L/min) 3 L/min  Assess: MEWS Score  MEWS Temp 0  MEWS Systolic 0  MEWS Pulse 0  MEWS RR 1  MEWS LOC 0  MEWS Score 1  MEWS Score Color Green  Treat  Multiple Pain Sites Yes  2nd Pain Site  Pain Score 4  Pain Type Acute pain  Pain Location Chest  Pain Descriptors / Indicators Pressure  Pain Frequency Intermittent  Patient's Stated Pain Goal 0  Pain Intervention(s) Medication (See eMAR);MD notified (Comment)  Notify: Charge Nurse/RN  Name of Charge Nurse/RN Notified Jori Moll  Date Charge Nurse/RN Notified 05/30/20  Time Charge Nurse/RN Notified 2153  Notify: Provider  Date Provider Notified 05/30/20  Time Provider Notified 2338   Pt c/o  pressure likechest pain ,  Back pain and headache.  Charge nurse notified,  EKG   No new findings  pain and scheduled meds given , pt reassured and repositioned.  HOB 30 degrees . Pt,  Verbalized relief of pain  After  30-45 minutes. No acute distress vs stable. MD  Made aware.  No new orders given.                                                                          ; CP o Back pain /H/A 1/10

## 2020-05-31 NOTE — Progress Notes (Signed)
Patient voiced no c/o this am . Due meads  Given resting comfortably in bed.

## 2020-05-31 NOTE — Progress Notes (Signed)
Patient oob for meal sitting in chair highest heart rate went up to is 106 lopressor effective so far. Husband at bedside visiting with patient she declined ambulation reported coughing and wheezing better with robitussin but she is still coughing some did not want to walk till this is better reports gets hot starts coughing again.
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
## 2020-05-31 NOTE — Progress Notes (Signed)
   05/31/20 1022  Vitals  Temp 98.2 F (36.8 C)  Temp Source Oral  BP 133/89  BP Location Left Arm  BP Method Automatic  Patient Position (if appropriate) Sitting  Pulse Rate (!) 114  ECG Heart Rate (!) 114  Resp (!) 26  Level of Consciousness  Level of Consciousness Alert  MEWS COLOR  MEWS Score Color Red  Oxygen Therapy  SpO2 90 %  O2 Device Nasal Cannula  O2 Flow Rate (L/min) 6 L/min  Pain Assessment  Pain Scale 0-10  Pain Score 0  PCA/Epidural/Spinal Assessment  Respiratory Pattern Dyspnea with exertion  ECG Monitoring  New onset of dysrhythmia? No  Glasgow Coma Scale  Eye Opening 4  Best Verbal Response (NON-intubated) 5  Modified Verbal Response (INTUBATED) 5  Best Motor Response 6  Glasgow Coma Scale Score (!) 20  MEWS Score  MEWS Temp 0  MEWS Systolic 0  MEWS Pulse 2  MEWS RR 2  MEWS LOC 0  MEWS Score 4  Provider Notification  Provider Name/Title Dr.samantani,Jai-G  Date Provider Notified 05/31/20  Time Provider Notified 1022  Notification Type Page  Notification Reason Other (Comment)  Response  (metoprolol ordered for elevated HR)  Date of Provider Response 05/31/20  Time of Provider Response 1030  Rapid Response Notification  Name of Rapid Response RN Notified  (not this time patient stable dyspnea not new on exertion.)  Note  Observations  (MD added heart rate control med for elevated hr with exertio)

## 2020-05-31 NOTE — Plan of Care (Signed)
  Problem: Education: Goal: Knowledge of General Education information will improve Description: Including pain rating scale, medication(s)/side effects and non-pharmacologic comfort measures Outcome: Progressing   Problem: Health Behavior/Discharge Planning: Goal: Ability to manage health-related needs will improve Outcome: Progressing   Problem: Clinical Measurements: Goal: Ability to maintain clinical measurements within normal limits will improve Outcome: Progressing Goal: Will remain free from infection Outcome: Progressing Goal: Diagnostic test results will improve Outcome: Progressing Goal: Respiratory complications will improve Outcome: Progressing Goal: Cardiovascular complication will be avoided Outcome: Progressing   Problem: Activity: Goal: Risk for activity intolerance will decrease Outcome: Progressing   Problem: Nutrition: Goal: Adequate nutrition will be maintained Outcome: Progressing   Problem: Coping: Goal: Level of anxiety will decrease Outcome: Progressing   Problem: Elimination: Goal: Will not experience complications related to bowel motility Outcome: Progressing Goal: Will not experience complications related to urinary retention Outcome: Progressing   Problem: Pain Managment: Goal: General experience of comfort will improve Outcome: Progressing   Problem: Safety: Goal: Ability to remain free from injury will improve Outcome: Progressing   Problem: Skin Integrity: Goal: Risk for impaired skin integrity will decrease Outcome: Progressing   Problem: Consults Goal: Diagnosis - Venous Thromboembolism (VTE) Description: Choose a selection Outcome: Progressing Goal: Pharmacy Consult for anticoagulation Outcome: Progressing   Problem: Phase I Progression Outcomes Goal: Dyspnea controlled at rest (PE) Outcome: Progressing Goal: Tolerating diet Outcome: Progressing Goal: Initial discharge plan identified Outcome: Progressing Goal:  Voiding-avoid urinary catheter unless indicated Outcome: Progressing Goal: Hemodynamically stable Outcome: Progressing Goal: Other Phase I Outcomes/Goals Outcome: Progressing   Problem: Phase II Progression Outcomes Goal: 02 sats trending upward/stable (PE) Outcome: Progressing Goal: Discharge plan established Outcome: Progressing Goal: Tolerating diet Outcome: Progressing Goal: Other Phase II Outcomes/Goals Outcome: Progressing   Problem: Phase III Progression Outcomes Goal: 02 sats stabilized Outcome: Progressing Goal: Activity at appropriate level-compared to baseline Description: (UP IN CHAIR FOR HEMODIALYSIS) Outcome: Progressing Goal: Discharge plan remains appropriate-arrangements made Outcome: Progressing Goal: Other Phase III Outcomes/Goals Outcome: Progressing   Problem: Discharge Progression Outcomes Goal: Barriers To Progression Addressed/Resolved Outcome: Progressing Goal: Discharge plan in place and appropriate Outcome: Progressing Goal: Pain controlled with appropriate interventions Outcome: Progressing Goal: Hemodynamically stable Outcome: Progressing Goal: Complications resolved/controlled Outcome: Progressing Goal: Tolerating diet Outcome: Progressing Goal: Activity appropriate for discharge plan Outcome: Progressing Goal: Other Discharge Outcomes/Goals Outcome: Progressing

## 2020-05-31 NOTE — Progress Notes (Signed)
Patient refused ambulation yesterday due to back pain and shortness of breath told writer just wanted to sleep not sleeping well since got here. MD in to address above prns ordered will see if that helps.

## 2020-06-01 ENCOUNTER — Inpatient Hospital Stay (HOSPITAL_COMMUNITY): Payer: BC Managed Care – PPO

## 2020-06-01 DIAGNOSIS — I82402 Acute embolism and thrombosis of unspecified deep veins of left lower extremity: Secondary | ICD-10-CM | POA: Diagnosis not present

## 2020-06-01 DIAGNOSIS — I749 Embolism and thrombosis of unspecified artery: Secondary | ICD-10-CM | POA: Diagnosis not present

## 2020-06-01 HISTORY — PX: IR INFUSION THROMBOL ARTERIAL INITIAL (MS): IMG5376

## 2020-06-01 HISTORY — PX: IR US GUIDE VASC ACCESS RIGHT: IMG2390

## 2020-06-01 LAB — CBC
HCT: 43.9 % (ref 36.0–46.0)
HCT: 44.9 % (ref 36.0–46.0)
Hemoglobin: 13.5 g/dL (ref 12.0–15.0)
Hemoglobin: 13.7 g/dL (ref 12.0–15.0)
MCH: 27.2 pg (ref 26.0–34.0)
MCH: 27.1 pg (ref 26.0–34.0)
MCHC: 30.8 g/dL (ref 30.0–36.0)
MCHC: 30.5 g/dL (ref 30.0–36.0)
MCV: 88.5 fL (ref 80.0–100.0)
MCV: 88.9 fL (ref 80.0–100.0)
Platelets: 327 10*3/uL (ref 150–400)
Platelets: 310 10*3/uL (ref 150–400)
RBC: 4.96 MIL/uL (ref 3.87–5.11)
RBC: 5.05 MIL/uL (ref 3.87–5.11)
RDW: 15.4 % (ref 11.5–15.5)
RDW: 15.6 % — ABNORMAL HIGH (ref 11.5–15.5)
WBC: 12.4 10*3/uL — ABNORMAL HIGH (ref 4.0–10.5)
WBC: 11.1 10*3/uL — ABNORMAL HIGH (ref 4.0–10.5)
nRBC: 0.2 % (ref 0.0–0.2)
nRBC: 0.4 % — ABNORMAL HIGH (ref 0.0–0.2)

## 2020-06-01 LAB — BASIC METABOLIC PANEL
Anion gap: 9 (ref 5–15)
BUN: 19 mg/dL (ref 6–20)
CO2: 21 mmol/L — ABNORMAL LOW (ref 22–32)
Calcium: 8.5 mg/dL — ABNORMAL LOW (ref 8.9–10.3)
Chloride: 107 mmol/L (ref 98–111)
Creatinine, Ser: 1 mg/dL (ref 0.44–1.00)
GFR, Estimated: >60 mL/min (ref >60)
Glucose, Bld: 170 mg/dL — ABNORMAL HIGH (ref 70–99)
Potassium: 4.2 mmol/L (ref 3.5–5.1)
Sodium: 137 mmol/L (ref 135–145)

## 2020-06-01 LAB — GLUCOSE, CAPILLARY
Glucose-Capillary: 134 mg/dL — ABNORMAL HIGH (ref 70–99)
Glucose-Capillary: 148 mg/dL — ABNORMAL HIGH (ref 70–99)
Glucose-Capillary: 181 mg/dL — ABNORMAL HIGH (ref 70–99)
Glucose-Capillary: 146 mg/dL — ABNORMAL HIGH (ref 70–99)

## 2020-06-01 LAB — APTT: aPTT: 40 seconds — ABNORMAL HIGH (ref 24–36)

## 2020-06-01 LAB — FIBRINOGEN: Fibrinogen: 536 mg/dL — ABNORMAL HIGH (ref 210–475)

## 2020-06-01 LAB — HEPARIN LEVEL (UNFRACTIONATED): Heparin Unfractionated: 0.8 IU/mL — ABNORMAL HIGH (ref 0.30–0.70)

## 2020-06-01 MED ORDER — SODIUM CHLORIDE 0.9 % IV SOLN
12.0000 mg | Freq: Once | INTRAVENOUS | Status: AC
  Administered 2020-06-01: 12 mg via INTRAVENOUS
  Filled 2020-06-01: qty 12

## 2020-06-01 MED ORDER — SODIUM CHLORIDE 0.9 % IV SOLN: INTRAVENOUS | Status: DC

## 2020-06-01 MED ORDER — LIDOCAINE HCL 1 % IJ SOLN
INTRAMUSCULAR | Status: AC | PRN
  Administered 2020-06-01: 30 mL via INTRADERMAL

## 2020-06-01 MED ORDER — SODIUM CHLORIDE 0.9 % IV SOLN: 250.0000 mL | INTRAVENOUS | Status: DC | PRN

## 2020-06-01 MED ORDER — SODIUM CHLORIDE 0.9% FLUSH: 3.0000 mL | INTRAVENOUS | Status: DC | PRN

## 2020-06-01 MED ORDER — MIDAZOLAM HCL 2 MG/2ML IJ SOLN
INTRAMUSCULAR | Status: AC
  Filled 2020-06-01: qty 6

## 2020-06-01 MED ORDER — LIDOCAINE HCL (PF) 1 % IJ SOLN
INTRAMUSCULAR | Status: AC
  Filled 2020-06-01: qty 30

## 2020-06-01 MED ORDER — SODIUM CHLORIDE 0.9% FLUSH
10.0000 mL | Freq: Two times a day (BID) | INTRAVENOUS | Status: DC
  Administered 2020-06-01 – 2020-06-07 (×11): 10 mL
  Administered 2020-06-07: 20 mL
  Administered 2020-06-08 (×2): 10 mL

## 2020-06-01 MED ORDER — CHLORHEXIDINE GLUCONATE CLOTH 2 % EX PADS
6.0000 | MEDICATED_PAD | Freq: Every day | CUTANEOUS | Status: DC
  Administered 2020-06-02 – 2020-06-08 (×7): 6 via TOPICAL

## 2020-06-01 MED ORDER — IOHEXOL 300 MG/ML  SOLN
150.0000 mL | Freq: Once | INTRAMUSCULAR | Status: AC | PRN
  Administered 2020-06-01: 25 mL

## 2020-06-01 MED ORDER — SODIUM CHLORIDE 0.9% FLUSH
3.0000 mL | Freq: Two times a day (BID) | INTRAVENOUS | Status: DC
  Administered 2020-06-02 – 2020-06-08 (×8): 3 mL via INTRAVENOUS

## 2020-06-01 MED ORDER — MIDAZOLAM HCL 2 MG/2ML IJ SOLN
INTRAMUSCULAR | Status: AC | PRN
  Administered 2020-06-01 (×4): 1 mg via INTRAVENOUS

## 2020-06-01 MED ORDER — SODIUM CHLORIDE 0.9% FLUSH
10.0000 mL | INTRAVENOUS | Status: DC | PRN
  Administered 2020-06-04: 10 mL

## 2020-06-01 MED ORDER — HEPARIN (PORCINE) 25000 UT/250ML-% IV SOLN
1200.0000 [IU]/h | INTRAVENOUS | Status: DC
  Administered 2020-06-01: 800 [IU]/h via INTRAVENOUS
  Administered 2020-06-02: 1000 [IU]/h via INTRAVENOUS
  Filled 2020-06-01 (×2): qty 250

## 2020-06-01 MED ORDER — CHLORHEXIDINE GLUCONATE CLOTH 2 % EX PADS: 6.0000 | MEDICATED_PAD | Freq: Every day | CUTANEOUS | Status: DC

## 2020-06-01 MED ORDER — "THROMBI-PAD 3""X3"" EX PADS"
1.0000 | MEDICATED_PAD | CUTANEOUS | Status: AC
  Filled 2020-06-01: qty 1

## 2020-06-01 NOTE — Progress Notes (Signed)
Pt states she had a severe panic attack when placed on BIPAP and would prefer not to wear.

## 2020-06-01 NOTE — Sedation Documentation (Signed)
PAP 40/36 (37)

## 2020-06-01 NOTE — Sedation Documentation (Signed)
Pt transported to 2M13 on bed, monitored with RN.

## 2020-06-01 NOTE — Sedation Documentation (Signed)
Spoke with pt's nurse, Ava, RN, to clarify whether pt would be returning to [facility]. She was not aware, however, the pt told her the Dr told her she would be going to ICU post procedure. Requested that Ava call ordering provider for clarification and to get appropriate orders to transfer pt to higher level of care.
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
## 2020-06-01 NOTE — Procedures (Signed)
  Procedure: Initiation of bilat cath directed PA lysis via R IJ EBL:   minimal Complications:  none immediate  See full dictation in [employer].  Thora Lance MD Main # [phone] Pager  [phone] Mobile [phone]

## 2020-06-01 NOTE — Sedation Documentation (Signed)
SBAR given to [place], Charity fundraiser at bedside. All questions answered to satisfaction. TPA infusion catheters connected and TPA started. See MAR.
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
## 2020-06-01 NOTE — Sedation Documentation (Signed)
Spoke with Tana Conch, RN, house supervisor. He is aware of pts need for ICU. He suspects pt will be going to 28M post procedure, however, there will not be a nurse available until 1430. I told him I will call him when we are finished with the case.

## 2020-06-01 NOTE — Progress Notes (Signed)
Spoke with pharmacy  Greig Castilla regarding heparin infusion order at 8 ml/hour. He said ok to start heparin per Radiology Md order.

## 2020-06-01 NOTE — Progress Notes (Addendum)
ANTICOAGULATION CONSULT NOTE  Pharmacy Consult for heparin  Indication: pulmonary embolus  Allergies  Allergen Reactions  . Covid-19 Mrna Vacc (Moderna) Swelling and Other (See Comments)    Arm developed blisters and a sunburned appearance and throat/tonsils were swollen the next morning    Patient Measurements: Height: 5\' 6"  (167.6 cm) Weight: (!) 161.8 kg (356 lb 11.3 oz) IBW/kg (Calculated) : 59.3 Heparin Dosing Weight: 100kg  Vital Signs: Temp: 99.2 F (37.3 C) (10/10 1146) Temp Source: Oral (10/10 1146) BP: 147/117 (10/10 1431) Pulse Rate: 106 (10/10 1518)  Labs: Recent Labs    05/29/20 1713 05/29/20 2019 05/29/20 2356 05/30/20 0257 05/30/20 0257 05/30/20 0935 05/31/20 0333 06/01/20 0312  HGB   < >  --   --  14.4   < >  --  13.3 13.5  HCT   < >  --   --  46.5*  --   --  43.3 43.9  PLT   < >  --   --  362  --   --  307 327  LABPROT  --   --   --   --   --  13.1 18.6*  --   INR  --   --   --   --   --  1.0 1.6*  --   CREATININE   < >  --   --  0.92  --   --  0.75 1.00  TROPONINIHS  --  441* 376*  --   --   --   --   --    < > = values in this interval not displayed.    Estimated Creatinine Clearance: 108.9 mL/min (by C-G formula based on SCr of 1 mg/dL).   Medical History: Past Medical History:  Diagnosis Date  . COVID-19   . Hypertension 2015  . IBS (irritable bowel syndrome)   . Pulmonary embolism (HCC)     Medications:  Medications Prior to Admission  Medication Sig Dispense Refill Last Dose  . ALPRAZolam (XANAX) 0.5 MG tablet Take 0.5 mg by mouth 3 (three) times daily.    05/29/2020 at am  . ARIPiprazole (ABILIFY) 5 MG tablet Take 5 mg by mouth daily.   05/29/2020 at am  . aspirin EC 81 MG tablet Take 81 mg by mouth daily.   05/29/2020 at 1100  . benazepril (LOTENSIN) 20 MG tablet Take 20 mg by mouth daily.   05/29/2020 at Unknown time  . busPIRone (BUSPAR) 30 MG tablet Take 30 mg by mouth 3 (three) times daily.    05/29/2020 at am  . desvenlafaxine  (PRISTIQ) 100 MG 24 hr tablet Take 100 mg by mouth daily.   05/29/2020 at am  . gabapentin (NEURONTIN) 300 MG capsule Take 600-900 mg by mouth See admin instructions. Take 600 mg by mouth in the morning, 600 mg midday, and 900 mg at bedtime  0 05/29/2020 at am  . naproxen (NAPROSYN) 500 MG tablet Take 500 mg by mouth 2 (two) times daily as needed (for pain and/or inflammation).    Past Week at Unknown time  . ondansetron (ZOFRAN) 8 MG tablet Take 8 mg by mouth every 8 (eight) hours as needed for nausea or vomiting.    unk at 07/29/2020  . tiZANidine (ZANAFLEX) 4 MG tablet Take 4 mg by mouth every 8 (eight) hours as needed for muscle spasms.   0 unk at unk  . traMADol (ULTRAM) 50 MG tablet Take 50-100 mg by mouth See admin instructions.  Take 50-100 mg by mouth one to two times a day as needed for pain   05/28/2020 at Unknown time  . zonisamide (ZONEGRAN) 100 MG capsule Take 100 mg by mouth daily.    05/29/2020 at Unknown time   Scheduled:  . ALPRAZolam  0.5 mg Oral TID  . ARIPiprazole  5 mg Oral Daily  . busPIRone  30 mg Oral TID  . gabapentin  600 mg Oral BID   And  . gabapentin  900 mg Oral QHS  . insulin aspart  0-5 Units Subcutaneous QHS  . insulin aspart  0-9 Units Subcutaneous TID WC  . lidocaine (PF)      . metFORMIN  500 mg Oral BID WC  . midazolam      . sodium chloride flush  3 mL Intravenous Q12H  . sodium chloride flush  3 mL Intravenous Q12H  . sodium chloride flush  3 mL Intravenous Q12H  . venlafaxine XR  150 mg Oral Q breakfast  . zonisamide  100 mg Oral Daily   Infusions:  . sodium chloride    . sodium chloride    . [START ON 06/02/2020] sodium chloride    . [START ON 06/02/2020] sodium chloride    . alteplase (tPA/ ACTIVASE) PE Lysis 12 mg/250 mL (BILATERAL)    . alteplase (tPA/ ACTIVASE) PE Lysis 12 mg/250 mL (BILATERAL)      Assessment: Morbidly obese pt who was admitted for a new PE. She has a prior history of it in 2018 and is off of anticoagulation. She was started on  heparin but has had some IV issue was changed SQ lovenox then xarelto. She is catheter directed lysis and plans for low dose heparin (to start now per Dr. Deanne Coffer) -enoxaparin 150mg  given 10/8 at ~ 11am -Xarelto 15 mg given at 9am on 10/10 -hg= 13.5  Goal of Therapy:  aPTT 66-85 Monitor platelets by anticoagulation protocol: Yes   Plan:  --start heparin at 800 units/hr (~ 8 units/kg/hr) -aPTT in 6 hours -daily HL, aPTT and CBC  12-03-1989, PharmD Clinical Pharmacist **Pharmacist phone directory can now be found on [email or web address] ([email or web address]).  Listed under [facility].

## 2020-06-01 NOTE — Sedation Documentation (Signed)
Pt arrived to IR suite. 
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
## 2020-06-02 ENCOUNTER — Encounter (HOSPITAL_COMMUNITY): Payer: Self-pay

## 2020-06-02 ENCOUNTER — Inpatient Hospital Stay (HOSPITAL_COMMUNITY): Payer: BC Managed Care – PPO

## 2020-06-02 HISTORY — PX: IR INFUSION THROMBOL ARTERIAL INITIAL (MS): IMG5376

## 2020-06-02 HISTORY — PX: IR ANGIOGRAM SELECTIVE EACH ADDITIONAL VESSEL: IMG667

## 2020-06-02 HISTORY — PX: IR THROMB F/U EVAL ART/VEN FINAL DAY (MS): IMG5379

## 2020-06-02 LAB — CBC
HCT: 42.2 % (ref 36.0–46.0)
HCT: 43.9 % (ref 36.0–46.0)
HCT: 43.6 % (ref 36.0–46.0)
Hemoglobin: 12.8 g/dL (ref 12.0–15.0)
Hemoglobin: 13.3 g/dL (ref 12.0–15.0)
Hemoglobin: 13.1 g/dL (ref 12.0–15.0)
MCH: 27.4 pg (ref 26.0–34.0)
MCH: 27.3 pg (ref 26.0–34.0)
MCH: 27.3 pg (ref 26.0–34.0)
MCHC: 30.3 g/dL (ref 30.0–36.0)
MCHC: 30.3 g/dL (ref 30.0–36.0)
MCHC: 30 g/dL (ref 30.0–36.0)
MCV: 90.2 fL (ref 80.0–100.0)
MCV: 90 fL (ref 80.0–100.0)
MCV: 90.8 fL (ref 80.0–100.0)
Platelets: 277 10*3/uL (ref 150–400)
Platelets: 250 10*3/uL (ref 150–400)
Platelets: 253 10*3/uL (ref 150–400)
RBC: 4.68 MIL/uL (ref 3.87–5.11)
RBC: 4.88 MIL/uL (ref 3.87–5.11)
RBC: 4.8 MIL/uL (ref 3.87–5.11)
RDW: 15.6 % — ABNORMAL HIGH (ref 11.5–15.5)
RDW: 15.6 % — ABNORMAL HIGH (ref 11.5–15.5)
RDW: 15.7 % — ABNORMAL HIGH (ref 11.5–15.5)
WBC: 11.1 10*3/uL — ABNORMAL HIGH (ref 4.0–10.5)
WBC: 10.4 10*3/uL (ref 4.0–10.5)
WBC: 9.8 10*3/uL (ref 4.0–10.5)
nRBC: 0.4 % — ABNORMAL HIGH (ref 0.0–0.2)
nRBC: 0.3 % — ABNORMAL HIGH (ref 0.0–0.2)
nRBC: 0.4 % — ABNORMAL HIGH (ref 0.0–0.2)

## 2020-06-02 LAB — GLUCOSE, CAPILLARY
Glucose-Capillary: 168 mg/dL — ABNORMAL HIGH (ref 70–99)
Glucose-Capillary: 67 mg/dL — ABNORMAL LOW (ref 70–99)
Glucose-Capillary: 138 mg/dL — ABNORMAL HIGH (ref 70–99)
Glucose-Capillary: 160 mg/dL — ABNORMAL HIGH (ref 70–99)
Glucose-Capillary: 151 mg/dL — ABNORMAL HIGH (ref 70–99)

## 2020-06-02 LAB — BASIC METABOLIC PANEL
Anion gap: 8 (ref 5–15)
BUN: 15 mg/dL (ref 6–20)
CO2: 24 mmol/L (ref 22–32)
Calcium: 7.9 mg/dL — ABNORMAL LOW (ref 8.9–10.3)
Chloride: 105 mmol/L (ref 98–111)
Creatinine, Ser: 0.83 mg/dL (ref 0.44–1.00)
GFR, Estimated: >60 mL/min (ref >60)
Glucose, Bld: 182 mg/dL — ABNORMAL HIGH (ref 70–99)
Potassium: 4.1 mmol/L (ref 3.5–5.1)
Sodium: 137 mmol/L (ref 135–145)

## 2020-06-02 LAB — FIBRINOGEN
Fibrinogen: 488 mg/dL — ABNORMAL HIGH (ref 210–475)
Fibrinogen: 497 mg/dL — ABNORMAL HIGH (ref 210–475)
Fibrinogen: 497 mg/dL — ABNORMAL HIGH (ref 210–475)

## 2020-06-02 LAB — ANTITHROMBIN III: AntiThromb III Func: 88 % (ref 75–120)

## 2020-06-02 LAB — APTT
aPTT: 124 seconds — ABNORMAL HIGH (ref 24–36)
aPTT: 35 seconds (ref 24–36)
aPTT: 43 seconds — ABNORMAL HIGH (ref 24–36)
aPTT: 104 seconds — ABNORMAL HIGH (ref 24–36)

## 2020-06-02 LAB — HEPARIN LEVEL (UNFRACTIONATED)
Heparin Unfractionated: 0.42 IU/mL (ref 0.30–0.70)
Heparin Unfractionated: 0.28 IU/mL — ABNORMAL LOW (ref 0.30–0.70)

## 2020-06-02 MED ORDER — WARFARIN SODIUM 5 MG PO TABS: 5.0000 mg | ORAL_TABLET | Freq: Every day | ORAL | Status: DC

## 2020-06-02 MED ORDER — DEXTROSE 50 % IV SOLN
12.5000 g | INTRAVENOUS | Status: AC
  Administered 2020-06-02: 12.5 g via INTRAVENOUS

## 2020-06-02 MED ORDER — AMLODIPINE BESYLATE 5 MG PO TABS
5.0000 mg | ORAL_TABLET | Freq: Every day | ORAL | Status: DC
  Administered 2020-06-02 – 2020-06-09 (×8): 5 mg via ORAL
  Filled 2020-06-02 (×8): qty 1

## 2020-06-02 MED ORDER — WARFARIN SODIUM 7.5 MG PO TABS
7.5000 mg | ORAL_TABLET | Freq: Once | ORAL | Status: AC
  Administered 2020-06-02: 7.5 mg via ORAL
  Filled 2020-06-02: qty 1

## 2020-06-02 MED ORDER — DEXTROSE 50 % IV SOLN
INTRAVENOUS | Status: AC
  Filled 2020-06-02: qty 50

## 2020-06-02 MED ORDER — WARFARIN - PHARMACIST DOSING INPATIENT: Freq: Every day | Status: DC

## 2020-06-02 NOTE — Progress Notes (Addendum)
ANTICOAGULATION CONSULT NOTE  Pharmacy Consult for heparin  Indication: pulmonary embolus  Allergies  Allergen Reactions  . Covid-19 Mrna Vacc (Moderna) Swelling and Other (See Comments)    Arm developed blisters and a sunburned appearance and throat/tonsils were swollen the next morning  . Tape     redness, irritation, blistering, painful    Patient Measurements: Height: 5\' 6"  (167.6 cm) Weight: (!) 161.8 kg (356 lb 11.3 oz) IBW/kg (Calculated) : 59.3 Heparin Dosing Weight: 100kg  Vital Signs: Temp: 97.5 F (36.4 C) (10/11 0800) Temp Source: Oral (10/11 0800) BP: 153/95 (10/11 0700) Pulse Rate: 92 (10/11 0700)  Labs: Recent Labs    05/30/20 0935 05/31/20 0333 05/31/20 0333 06/01/20 0312 06/01/20 0312 06/01/20 2153 06/01/20 2200 06/02/20 0435 06/02/20 0738  HGB  --  13.3   < > 13.5   < >  --  13.7 12.8  --   HCT  --  43.3   < > 43.9  --   --  44.9 42.2  --   PLT  --  307   < > 327  --   --  310 277  --   APTT  --   --   --   --   --   --  40* 124* 35  LABPROT 13.1 18.6*  --   --   --   --   --   --   --   INR 1.0 1.6*  --   --   --   --   --   --   --   HEPARINUNFRC  --   --   --   --   --  0.80*  --   --   --   CREATININE  --  0.75  --  1.00  --   --   --   --   --    < > = values in this interval not displayed.    Estimated Creatinine Clearance: 108.9 mL/min (by C-G formula based on SCr of 1 mg/dL).  Assessment: Morbidly obese pt who was admitted for a new PE. She has a prior history of it in 2018 and is off of anticoagulation. She was started on heparin but has had some IV issue was changed SQ lovenox then xarelto. She is status post catheter directed lysis and OK to resume normal heparin goal per ID.  -Xarelto 15 mg given at 9am on 10/10  APTT is low at 35. Heparin was held for one hour per RN (due to prior concern of elevated aPTT - likely ok prior).   Plan to transition to warfarin 7.5 mg at 1700 today.  Goal of Therapy:  aPTT 66-102 Monitor  platelets by anticoagulation protocol: Yes   Plan:  -Restart heparin to 1000 units/hr -Re-check heparin level and aPTT at 1230 -Start warfarin 7.5 mg daily, goal INR 2-3 -Monitor for bleeding   12/10  [facility] PharmD Candidate 2022  2023, PharmD, BCPS, BCCCP Clinical Pharmacist [phone]  Please check AMION for all [facility] Pharmacy numbers  06/02/2020 8:44 AM

## 2020-06-02 NOTE — Progress Notes (Addendum)
ANTICOAGULATION CONSULT NOTE  Pharmacy Consult for heparin  Indication: pulmonary embolus  Allergies  Allergen Reactions   Covid-19 Mrna Vacc (Moderna) Swelling and Other (See Comments)    Arm developed blisters and a sunburned appearance and throat/tonsils were swollen the next morning   Tape     redness, irritation, blistering, painful    Patient Measurements: Height: 5\' 6"  (167.6 cm) Weight: (!) 161.8 kg (356 lb 11.3 oz) IBW/kg (Calculated) : 59.3 Heparin Dosing Weight: 100kg  Vital Signs: Temp: 97.7 F (36.5 C) (10/11 1126) Temp Source: Axillary (10/11 1126) BP: 157/103 (10/11 1200) Pulse Rate: 97 (10/11 1200)  Labs: Recent Labs    05/31/20 0333 05/31/20 0333 06/01/20 0312 06/01/20 0312 06/01/20 2153 06/01/20 2200 06/01/20 2200 06/02/20 0435 06/02/20 0738 06/02/20 1158  HGB 13.3   < > 13.5   < >  --  13.7   < > 12.8  --  13.3  HCT 43.3   < > 43.9   < >  --  44.9  --  42.2  --  43.9  PLT 307   < > 327   < >  --  310  --  277  --  250  APTT  --   --   --   --   --  40*   < > 124* 35 43*  LABPROT 18.6*  --   --   --   --   --   --   --   --   --   INR 1.6*  --   --   --   --   --   --   --   --   --   HEPARINUNFRC  --   --   --   --  0.80*  --   --   --   --  0.42  CREATININE 0.75  --  1.00  --   --   --   --   --   --  0.83   < > = values in this interval not displayed.    Estimated Creatinine Clearance: 131.2 mL/min (by C-G formula based on SCr of 0.83 mg/dL).  Assessment: Morbidly obese pt who was admitted for a new PE. She has a prior history of it in 2018 and is off of anticoagulation. She was started on heparin but has had some IV issue was changed SQ lovenox then xarelto. She is status post catheter directed lysis and OK to resume normal heparin goal per ID.  -Xarelto 15 mg given at 9am on 10/10  Repeat HL is therapeutic at 0.42, APTT is still subtherapeutic at 43, but based on heparin level, assuming Xarelto is cleared and will use HL moving forward  to monitor as is likely more accurate of true anticoagulation. H/H is normal and no overt s/sx of bleeding.  Plan to transition to warfarin 7.5 mg at 1700 today.  Goal of Therapy:  aPTT 66-102 Monitor platelets by anticoagulation protocol: Yes   Plan:  -Continue heparin to 1000 units/hr -Re-check heparin level at 2000 -[facility] warfarin 7.5 mg daily, goal INR 2-3 -Monitor for bleeding   [facility]  [facility] PharmD Candidate 2022  06/02/2020 12:47 PM  08/02/2020, PharmD, BCPS, BCCCP Clinical Pharmacist [phone]  Please check AMION for all [facility] Pharmacy numbers  06/02/2020 1:16 PM

## 2020-06-02 NOTE — Progress Notes (Signed)
Hypoglycemic Event  CBG: 67  Treatment: D50 25 mL (12.5 gm)  Symptoms: None  Follow-up CBG: Time: 0605 CBG Result:168  Possible Reasons for Event: Inadequate meal intake      [name]

## 2020-06-02 NOTE — Progress Notes (Signed)
Inpatient Diabetes Program Recommendations  AACE/ADA: New Consensus Statement on Inpatient Glycemic Control (2015)  Target Ranges:  Prepandial:   less than 140 mg/dL      Peak postprandial:   less than 180 mg/dL (1-2 hours)      Critically ill patients:  140 - 180 mg/dL   Lab Results  Component Value Date   GLUCAP 168 (H) 06/02/2020   HGBA1C 6.7 (H) 05/30/2020    Review of Glycemic Control Results for Holly Moon, Holly Moon (MRN [id number]) as of 06/02/2020 10:36  Ref. Range 06/01/2020 15:43 06/01/2020 21:58 06/02/2020 05:35 06/02/2020 06:05  Glucose-Capillary Latest Ref Range: 70 - 99 mg/dL 546 (H) 270 (H) 67 (L) 168 (H)   Diabetes history: new onset?  Outpatient Diabetes medications: none Current orders for Inpatient glycemic control: Metformin 500 mg BID, Novolog 0-9 units TID, Novolog 0-5 units QHS  Inpatient Diabetes Program Recommendations:    Noted mild hypoglycemia of 67 mg/dL this AM. Of note, patient received 2 units of Novolog 2 hours following p[revious CBG. Please ensure correction is given within one hour of CBG to lower risk of hypoglycemia.   Also, per ADA guidelines A1C mets criteria for diabetes diagnosis. Once notated in MD progress note, will begin education when appropriate.   Thanks, Lujean Rave, MSN, RNC-OB Diabetes Coordinator [phone] (8a-5p)

## 2020-06-02 NOTE — Progress Notes (Addendum)
To patients room with Pearlean Brownie to check Pulmonary artery pressure and remove line. Both infusion catheters withdrawn approximately 3 cm.  54/26 Mean 30  Left PAP., 31/23 Mean 28 Right PAP.  Lines removed by Elizebeth Koller

## 2020-06-02 NOTE — TOC Benefit Eligibility Note (Signed)
Transition of Care [facility]) Benefit Eligibility Note    Patient Details  Name: Holly Moon MRN: [id number] Date of Birth: 05-03-1971   Medication/Dose: Eliquis 81m. bid. Xarelto 211m daily 30 day supply  Covered?: Yes  Tier: 3 Drug  Prescription Coverage Preferred Pharmacy: [facility] and [facility]  Spoke with Person/Company/Phone Number:: Beckie K. Phamacy Help desk w/[phone]-Pay: Zero Co-pay  Prior Approval: No  Deductible: Met       HaShelda Alteshone Number: 06/02/2020, 10:10 AM

## 2020-06-02 NOTE — Progress Notes (Signed)
Referring Physician(s): * No referring provider recorded for this case *  Supervising Physician: Irish Lack  Patient Status:  [facility] - In-pt  Chief Complaint: Pulmonary embolus  Subjective: Patient resting comfortably in bed.  Sitting up, eating crackers.  States she can tell she a difference in her breathing- converses more easily.  R sheaths remains in place.    Allergies: Covid-19 mrna vacc (moderna) and Tape  Medications: Prior to Admission medications   Medication Sig Start Date End Date Taking? Authorizing Provider  ALPRAZolam Prudy Feeler) 0.5 MG tablet Take 0.5 mg by mouth 3 (three) times daily.  06/30/19  Yes [provider]  ARIPiprazole (ABILIFY) 5 MG tablet Take 5 mg by mouth daily. 02/20/20  Yes [provider]  aspirin EC 81 MG tablet Take 81 mg by mouth daily.   Yes [provider]  benazepril (LOTENSIN) 20 MG tablet Take 20 mg by mouth daily.   Yes [provider]  busPIRone (BUSPAR) 30 MG tablet Take 30 mg by mouth 3 (three) times daily.  06/29/19  Yes [provider]  desvenlafaxine (PRISTIQ) 100 MG 24 hr tablet Take 100 mg by mouth daily.   Yes [provider]  gabapentin (NEURONTIN) 300 MG capsule Take 600-900 mg by mouth See admin instructions. Take 600 mg by mouth in the morning, 600 mg midday, and 900 mg at bedtime 01/25/18  Yes [provider]  naproxen (NAPROSYN) 500 MG tablet Take 500 mg by mouth 2 (two) times daily as needed (for pain and/or inflammation).  02/27/20  Yes [provider]  ondansetron (ZOFRAN) 8 MG tablet Take 8 mg by mouth every 8 (eight) hours as needed for nausea or vomiting.  01/31/20  Yes [provider]  tiZANidine (ZANAFLEX) 4 MG tablet Take 4 mg by mouth every 8 (eight) hours as needed for muscle spasms.  02/04/18  Yes [provider]  traMADol (ULTRAM) 50 MG tablet Take 50-100 mg by mouth See admin instructions. Take 50-100 mg by mouth one to two times  a day as needed for pain 01/30/20  Yes [provider]  zonisamide (ZONEGRAN) 100 MG capsule Take 100 mg by mouth daily.  03/12/20 06/10/20 Yes [provider]     Vital Signs: BP (!) 157/103   Pulse 97   Temp 97.7 F (36.5 C) (Axillary)   Resp 20   Ht 5\' 6"  (1.676 m)   Wt (!) 356 lb 11.3 oz (161.8 kg)   SpO2 94%   BMI 57.57 kg/m   Physical Exam  NAD, alert, sitting up in bed.  Neck: Sheath in place, saline infusing. No bleeding or oozing.  Pulm: no increased WOB at rest, on 5L [place], converses easily.   Imaging: DG Chest 2 View  Result Date: 06/01/2020 CLINICAL DATA:  Pneumonia EXAM: CHEST - 2 VIEW COMPARISON:  May 30, 2020 FINDINGS: The cardiomediastinal silhouette is unchanged and enlarged in contour. No pleural effusion. No pneumothorax. There is increased rounded opacity of the RIGHT upper lobe. LEFT lower lobe opacity is increased. Visualized abdomen is unremarkable. Multilevel degenerative changes of the thoracic spine. IMPRESSION: Increased RIGHT upper lobe and LEFT lower lobe opacities concerning for multifocal pneumonia. Differential considerations include pulmonary infarction given history of saddle embolism. Recommend radiographic or CT follow-up to resolution. Electronically Signed   By: June 01, 2020 MD   On: 06/01/2020 17:14   CT Angio Chest PE W/Cm &/Or Wo Cm  Result Date: 05/30/2020 CLINICAL DATA:  Shortness of breath EXAM: CT  ANGIOGRAPHY CHEST WITH CONTRAST TECHNIQUE: Multidetector CT imaging of the chest was performed using the standard protocol during bolus administration of intravenous contrast. Multiplanar CT image reconstructions and MIPs were obtained to evaluate the vascular anatomy. CONTRAST:  OMNIPAQUE IOHEXOL 350 MG/ML SOLN COMPARISON:  None. FINDINGS: Cardiovascular: There is a optimal opacification of the pulmonary arteries. There is a partially occlusive saddle thrombus extending into the bilateral main pulmonary arteries. Nearly  occlusive thrombus is seen extending into the right lower lobe segmental and subsegmental arterial branches. There is also partially occlusive thrombus within the right middle lobe and right upper lobe branches. Nearly occlusive thrombus is seen within the anterior left upper lobe branches with partially occlusive thrombus in the posterior left lower lobe branches. No pericardial effusion or thickening. There is evidence of right ventricular heart strain, RV/LV ratio=2.5. There is normal three-vessel brachiocephalic anatomy without proximal stenosis. The thoracic aorta is normal in appearance. Mediastinum/Nodes: No hilar, mediastinal, or axillary adenopathy. Thyroid gland, trachea, and esophagus demonstrate no significant findings. Lungs/Pleura: Rounded ground-glass patchy airspace opacity seen in the right upper lobe and posterior left lower lobe. No pleural effusion or pneumothorax is seen. Upper Abdomen: No acute abnormalities present in the visualized portions of the upper abdomen. Musculoskeletal: No chest wall abnormality. No acute or significant osseous findings. Review of the MIP images confirms the above findings. IMPRESSION: Saddle embolus with areas of occlusive and partially occlusive thrombus extending throughout both lungs as described above. CTevidence of right heart strain (RV/LV Ratio = 2.5) consistent with at least submassive (intermediate risk) PE. The presence of right heart strain has been associated with an increased risk of morbidity and mortality. Patchy ground-glass opacities in the right upper lung and posterior left lung base which could be due to infectious etiology. These results were called by telephone at the time of interpretation on 05/30/2020 at 1:00 am to provider Dr.Long, Who verbally acknowledged these results. Electronically Signed   By: Jonna Clark M.D.   On: 05/30/2020 01:04   IR Angiogram Selective Each Additional Vessel  Result Date: 06/02/2020 INDICATION: Bilateral  submassive pulmonary emboli.  See consultation.  EXAM: 1. ULTRASOUND GUIDANCE FOR VENOUS ACCESS X2 2. FLUOROSCOPIC GUIDED PLACEMENT OF BILATERAL PULMONARY ARTERIAL LYTIC INFUSION CATHETERS  COMPARISON:  Chest CTA-05/30/2020  MEDICATIONS: Versed 3 mg IV  Sedation time: 60 minutes  CONTRAST:  None required  COMPLICATIONS: None immediate  TECHNIQUE: Informed written consent was obtained from the patient after a discussion of the risks, benefits and alternatives to treatment. Questions regarding the procedure were encouraged and answered. A timeout was performed prior to the initiation of the procedure.  Ultrasound scanning was performed of the right IJ vein, patency confirmed and documented. As such, the right internal jugular vein was selected for vascular access.  The right neck was prepped and draped in the usual sterile fashion, and a sterile drape was applied covering the operative field. Maximum barrier sterile technique with sterile gowns and gloves were used for the procedure. A timeout was performed prior to the initiation of the procedure. Local anesthesia was provided with 1% lidocaine.  Under direct ultrasound guidance, the right internal jugular vein was accessed with a micro puncture sheath ultimately allowing placement of a 6 French vascular sheath. Slightly cranial to this initial access, the right internal jugular vein was again accessed with a micropuncture sheath ultimately allowing placement of a 6 French vascular sheath.  With the use of a Bentson wire, an angled pigtail catheter was advanced into the right  main pulmonary artery. Pressure measurements were then obtained from the proximal right pulmonary artery.  Over an exchange length wire, the pigtail catheter was exchanged for a 90/20 cm multi side-hole infusion catheter.  With the use of a Bentson wire, a pigtail catheter was advanced into the left main pulmonary artery. Over an exchange length wire, the pigtail catheter was  exchanged for a 90/10 cm multi side-hole assisted infusion catheter.  A postprocedural fluoroscopic image was obtained of the check demonstrating final catheter positioning.  Both vascular sheath were secured at the right neck with 0 silk suture. The external catheter tubing was secured at the right chest and the lytic therapy was initiated.  The patient tolerated the procedure well without immediate postprocedural complication.  FINDINGS: Acquired pressure measurements:  Right main pulmonary artery-40/37 (38) mmHg (normal: < 25/10)  Following the procedure, both infusion catheter tips terminate within the distal aspects of the bilateral lower lobe sub segmental pulmonary arteries.  IMPRESSION: 1. Successful fluoroscopic guided initiation of bilateral catheter directed pulmonary arterial lysis for sub massive pulmonary embolism and right-sided heart strain. 2. Elevated pressure measurements within the right main pulmonary artery compatible with pulmonary arterial hypertension.  PLAN: -infuse x 10 hours in ICU  -recheck pulmonary arterial pressures and probable catheter removal   Electronically Signed   By: Corlis Leak M.D.  IR Angiogram Selective Each Additional Vessel  Result Date: 06/02/2020 INDICATION: Bilateral submassive pulmonary emboli.  See consultation.  EXAM: 1. ULTRASOUND GUIDANCE FOR VENOUS ACCESS X2 2. FLUOROSCOPIC GUIDED PLACEMENT OF BILATERAL PULMONARY ARTERIAL LYTIC INFUSION CATHETERS  COMPARISON:  Chest CTA-05/30/2020  MEDICATIONS: Versed 3 mg IV  Sedation time: 60 minutes  CONTRAST:  None required  COMPLICATIONS: None immediate  TECHNIQUE: Informed written consent was obtained from the patient after a discussion of the risks, benefits and alternatives to treatment. Questions regarding the procedure were encouraged and answered. A timeout was performed prior to the initiation of the procedure.  Ultrasound scanning was performed of the right IJ vein, patency confirmed and  documented. As such, the right internal jugular vein was selected for vascular access.  The right neck was prepped and draped in the usual sterile fashion, and a sterile drape was applied covering the operative field. Maximum barrier sterile technique with sterile gowns and gloves were used for the procedure. A timeout was performed prior to the initiation of the procedure. Local anesthesia was provided with 1% lidocaine.  Under direct ultrasound guidance, the right internal jugular vein was accessed with a micro puncture sheath ultimately allowing placement of a 6 French vascular sheath. Slightly cranial to this initial access, the right internal jugular vein was again accessed with a micropuncture sheath ultimately allowing placement of a 6 French vascular sheath.  With the use of a Bentson wire, an angled pigtail catheter was advanced into the right main pulmonary artery. Pressure measurements were then obtained from the proximal right pulmonary artery.  Over an exchange length wire, the pigtail catheter was exchanged for a 90/20 cm multi side-hole infusion catheter.  With the use of a Bentson wire, a pigtail catheter was advanced into the left main pulmonary artery. Over an exchange length wire, the pigtail catheter was exchanged for a 90/10 cm multi side-hole assisted infusion catheter.  A postprocedural fluoroscopic image was obtained of the check demonstrating final catheter positioning.  Both vascular sheath were secured at the right neck with 0 silk suture. The external catheter tubing was secured at the right chest and the lytic therapy was  initiated.  The patient tolerated the procedure well without immediate postprocedural complication.  FINDINGS: Acquired pressure measurements:  Right main pulmonary artery-40/37 (38) mmHg (normal: < 25/10)  Following the procedure, both infusion catheter tips terminate within the distal aspects of the bilateral lower lobe sub segmental pulmonary arteries.   IMPRESSION: 1. Successful fluoroscopic guided initiation of bilateral catheter directed pulmonary arterial lysis for sub massive pulmonary embolism and right-sided heart strain. 2. Elevated pressure measurements within the right main pulmonary artery compatible with pulmonary arterial hypertension.  PLAN: -infuse x 10 hours in ICU  -recheck pulmonary arterial pressures and probable catheter removal   Electronically Signed   By: Corlis Leak  Hassell M.D.  IR US Guide Vasc Access Right  Result Date: 06/01/2020 INDICATION: Bilateral submassive pulmonary emboli.  See consultation. EXAM: 1. ULTRASOUND GUIDANCE FOR VENOUS ACCESS X2 2. FLUOROSCOPIC GUIDED PLACEMENT OF BILATERAL PULMONARY ARTERIAL LYTIC INFUSION CATHETERS COMPARISON:  Chest CTA-05/30/2020 MEDICATIONS: Versed 3 mg IV Sedation time: 60 minutes CONTRAST:  None required COMPLICATIONS: None immediate TECHNIQUE: Informed written consent was obtained from the patient after a discussion of the risks, benefits and alternatives to treatment. Questions regarding the procedure were encouraged and answered. A timeout was performed prior to the initiation of the procedure. Ultrasound scanning was performed of the right IJ vein, patency confirmed and documented. As such, the right internal jugular vein was selected for vascular access. The right neck was prepped and draped in the usual sterile fashion, and a sterile drape was applied covering the operative field. Maximum barrier sterile technique with sterile gowns and gloves were used for the procedure. A timeout was performed prior to the initiation of the procedure. Local anesthesia was provided with 1% lidocaine. Under direct ultrasound guidance, the right internal jugular vein was accessed with a micro puncture sheath ultimately allowing placement of a 6 French vascular sheath. Slightly cranial to this initial access, the right internal jugular vein was again accessed with a micropuncture sheath ultimately allowing  placement of a 6 French vascular sheath. With the use of a Bentson wire, an angled pigtail catheter was advanced into the right main pulmonary artery. Pressure measurements were then obtained from the proximal right pulmonary artery. Over an exchange length wire, the pigtail catheter was exchanged for a 90/20 cm multi side-hole infusion catheter. With the use of a Bentson wire, a pigtail catheter was advanced into the left main pulmonary artery. Over an exchange length wire, the pigtail catheter was exchanged for a 90/10 cm multi side-hole assisted infusion catheter. A postprocedural fluoroscopic image was obtained of the check demonstrating final catheter positioning. Both vascular sheath were secured at the right neck with 0 silk suture. The external catheter tubing was secured at the right chest and the lytic therapy was initiated. The patient tolerated the procedure well without immediate postprocedural complication. FINDINGS: Acquired pressure measurements: Right main pulmonary artery-40/37 (38) mmHg (normal: < 25/10) Following the procedure, both infusion catheter tips terminate within the distal aspects of the bilateral lower lobe sub segmental pulmonary arteries. IMPRESSION: 1. Successful fluoroscopic guided initiation of bilateral catheter directed pulmonary arterial lysis for sub massive pulmonary embolism and right-sided heart strain. 2. Elevated pressure measurements within the right main pulmonary artery compatible with pulmonary arterial hypertension. PLAN: -infuse x 10 hours in ICU -recheck pulmonary arterial pressures and probable catheter removal Electronically Signed   By: Corlis Leak  Hassell M.D.   On: 06/01/2020 15:22   DG Chest Port 1 View  Result Date: 05/29/2020 CLINICAL DATA:  Shortness of breath EXAM:  PORTABLE CHEST 1 VIEW COMPARISON:  07/24/2019 FINDINGS: Cardiac shadow is stable in appearance accentuated by the frontal technique. No focal infiltrate or sizable effusion is seen. No bony  abnormality is noted. IMPRESSION: No acute abnormality seen. Electronically Signed   By: Alcide Clever M.D.   On: 05/29/2020 17:43   ECHOCARDIOGRAM COMPLETE  Result Date: 05/30/2020    ECHOCARDIOGRAM REPORT   Patient Name:   KHAMIYA VARIN [facility] Date of Exam: 05/30/2020 Medical Rec #:  [id number]           Height:       66.0 in Accession #:    [id number]          Weight:       352.4 lb Date of Birth:  May 04, 1971          BSA:          2.544 m Patient Age:    48 years            BP:           124/78 mmHg Patient Gender: F                   HR:           98 bpm. Exam Location:  Inpatient Procedure: 2D Echo and Intracardiac Opacification Agent Indications:    pulmonary embolus 415.19  History:        Patient has no prior history of Echocardiogram examinations.                 Signs/Symptoms:elevated troponin, Chest Pain and Shortness of                 Breath; Risk Factors:Current Smoker.  Sonographer:    Delcie Roch Referring Phys: [id number] TIMOTHY S OPYD  Sonographer Comments: Patient is morbidly obese. Image acquisition challenging due to respiratory motion. IMPRESSIONS  1. The RV is markedly dilated and has severely reduced function . Consider pulmonary embolus.  2. Left ventricular ejection fraction, by estimation, is >75%. The left ventricle has hyperdynamic function. The left ventricle has no regional wall motion abnormalities. Left ventricular diastolic parameters are consistent with Grade I diastolic dysfunction (impaired relaxation).  3. Right ventricular systolic function is severely reduced. The right ventricular size is moderately enlarged.  4. The mitral valve is normal in structure. No evidence of mitral valve regurgitation.  5. The aortic valve is normal in structure. Aortic valve regurgitation is not visualized. No aortic stenosis is present. FINDINGS  Left Ventricle: Left ventricular ejection fraction, by estimation, is >75%. The left ventricle has hyperdynamic function. The left ventricle has  no regional wall motion abnormalities. Definity contrast agent was given IV to delineate the left ventricular endocardial borders. The left ventricular internal cavity size was small. There is no left ventricular hypertrophy. Left ventricular diastolic parameters are consistent with Grade I diastolic dysfunction (impaired relaxation). Right Ventricle: The right ventricular size is moderately enlarged. Right vetricular wall thickness was not well visualized. Right ventricular systolic function is severely reduced. Left Atrium: Left atrial size was normal in size. Right Atrium: Right atrial size was not well visualized. Pericardium: There is no evidence of pericardial effusion. Mitral Valve: The mitral valve is normal in structure. No evidence of mitral valve regurgitation. Tricuspid Valve: The tricuspid valve is grossly normal. Tricuspid valve regurgitation is trivial. Aortic Valve: The aortic valve is normal in structure. Aortic valve regurgitation is not visualized. No aortic stenosis is present. Pulmonic Valve: The pulmonic valve was  normal in structure. Pulmonic valve regurgitation is not visualized. Aorta: The aortic root and ascending aorta are structurally normal, with no evidence of dilitation. IAS/Shunts: The atrial septum is grossly normal. Additional Comments: The RV is markedly dilated and has severely reduced function . Consider pulmonary embolus.  LEFT VENTRICLE PLAX 2D LVIDd:         3.60 cm  Diastology LVIDs:         2.40 cm  LV e' medial:   9.90 cm/s LV PW:         1.20 cm  LV E/e' medial: 0.9 LV IVS:        1.10 cm LVOT diam:     2.00 cm LV SV:         47 LV SV Index:   18 LVOT Area:     3.14 cm  RIGHT VENTRICLE             IVC RV S prime:     12.20 cm/s  IVC diam: 2.50 cm TAPSE (M-mode): 1.5 cm LEFT ATRIUM           Index LA diam:      3.70 cm 1.45 cm/m LA Vol (A4C): 48.0 ml 18.87 ml/m  AORTIC VALVE LVOT Vmax:   82.20 cm/s LVOT Vmean:  58.100 cm/s LVOT VTI:    0.149 m  AORTA Ao Root diam: 3.10  cm MV E velocity: 9.14 cm/s                           SHUNTS                           Systemic VTI:  0.15 m                           Systemic Diam: 2.00 cm Kristeen Miss MD Electronically signed by Kristeen Miss MD Signature Date/Time: 05/30/2020/1:49:49 PM    Final    VAS [place] LOWER EXTREMITY VENOUS (DVT) (ONLY [facility] & [facility] 7a-7p)  Result Date: 05/30/2020  Lower Venous DVTStudy Indications: Swelling.  Risk Factors: History of PE. Limitations: Body habitus and poor ultrasound/tissue interface. Comparison Study: No prior studies available. Performing Technologist: Jean Rosenthal  Examination Guidelines: A complete evaluation includes B-mode imaging, spectral Doppler, color Doppler, and power Doppler as needed of all accessible portions of each vessel. Bilateral testing is considered an integral part of a complete examination. Limited examinations for reoccurring indications may be performed as noted. The reflux portion of the exam is performed with the patient in reverse Trendelenburg.  +-----+---------------+---------+-----------+----------+--------------+ RIGHTCompressibilityPhasicitySpontaneityPropertiesThrombus Aging +-----+---------------+---------+-----------+----------+--------------+ CFV  Full           Yes      Yes                                 +-----+---------------+---------+-----------+----------+--------------+   +---------+---------------+---------+-----------+----------+--------------+ LEFT     CompressibilityPhasicitySpontaneityPropertiesThrombus Aging +---------+---------------+---------+-----------+----------+--------------+ CFV      Full           Yes      Yes                                 +---------+---------------+---------+-----------+----------+--------------+ SFJ      Full                                                        +---------+---------------+---------+-----------+----------+--------------+  FV Prox  Full                                                         +---------+---------------+---------+-----------+----------+--------------+ FV Mid   Full                                                        +---------+---------------+---------+-----------+----------+--------------+ FV Distal                                             Not visualized +---------+---------------+---------+-----------+----------+--------------+ PFV      Full                                                        +---------+---------------+---------+-----------+----------+--------------+ POP      None           No       No                                  +---------+---------------+---------+-----------+----------+--------------+ PTV      None           No       No                                  +---------+---------------+---------+-----------+----------+--------------+ PERO     None           No       No                                  +---------+---------------+---------+-----------+----------+--------------+ Gastroc  None           No       No                                  +---------+---------------+---------+-----------+----------+--------------+     Summary: RIGHT: - No evidence of common femoral vein obstruction.  LEFT: - Findings consistent with acute deep vein thrombosis involving the left popliteal vein, left posterior tibial veins, left peroneal veins, and left gastrocnemius veins. - No cystic structure found in the popliteal fossa.  *See table(s) above for measurements and observations. Electronically signed by Lemar Livings MD on 05/30/2020 at 5:35:23 PM.    Final    IR INFUSION THROMBOL ARTERIAL INITIAL (MS)  Result Date: 06/02/2020 INDICATION: Bilateral submassive pulmonary emboli.  See consultation.  EXAM: 1. ULTRASOUND GUIDANCE FOR VENOUS ACCESS X2 2. FLUOROSCOPIC GUIDED PLACEMENT OF BILATERAL PULMONARY ARTERIAL LYTIC INFUSION CATHETERS  COMPARISON:  Chest CTA-05/30/2020  MEDICATIONS: Versed 3 mg IV   Sedation time: 60 minutes  CONTRAST:  None required  COMPLICATIONS: None immediate  TECHNIQUE: Informed written consent was obtained from the patient after a discussion of the risks, benefits and alternatives to treatment. Questions regarding the procedure were encouraged and answered. A timeout was performed prior to the initiation of the procedure.  Ultrasound scanning was performed of the right IJ vein, patency confirmed and documented. As such, the right internal jugular vein was selected for vascular access.  The right neck was prepped and draped in the usual sterile fashion, and a sterile drape was applied covering the operative field. Maximum barrier sterile technique with sterile gowns and gloves were used for the procedure. A timeout was performed prior to the initiation of the procedure. Local anesthesia was provided with 1% lidocaine.  Under direct ultrasound guidance, the right internal jugular vein was accessed with a micro puncture sheath ultimately allowing placement of a 6 French vascular sheath. Slightly cranial to this initial access, the right internal jugular vein was again accessed with a micropuncture sheath ultimately allowing placement of a 6 French vascular sheath.  With the use of a Bentson wire, an angled pigtail catheter was advanced into the right main pulmonary artery. Pressure measurements were then obtained from the proximal right pulmonary artery.  Over an exchange length wire, the pigtail catheter was exchanged for a 90/20 cm multi side-hole infusion catheter.  With the use of a Bentson wire, a pigtail catheter was advanced into the left main pulmonary artery. Over an exchange length wire, the pigtail catheter was exchanged for a 90/10 cm multi side-hole assisted infusion catheter.  A postprocedural fluoroscopic image was obtained of the check demonstrating final catheter positioning.  Both vascular sheath were secured at the right neck with 0 silk suture. The external  catheter tubing was secured at the right chest and the lytic therapy was initiated.  The patient tolerated the procedure well without immediate postprocedural complication.  FINDINGS: Acquired pressure measurements:  Right main pulmonary artery-40/37 (38) mmHg (normal: < 25/10)  Following the procedure, both infusion catheter tips terminate within the distal aspects of the bilateral lower lobe sub segmental pulmonary arteries.  IMPRESSION: 1. Successful fluoroscopic guided initiation of bilateral catheter directed pulmonary arterial lysis for sub massive pulmonary embolism and right-sided heart strain. 2. Elevated pressure measurements within the right main pulmonary artery compatible with pulmonary arterial hypertension.  PLAN: -infuse x 10 hours in ICU  -recheck pulmonary arterial pressures and probable catheter removal   Electronically Signed   By: Corlis Leak M.D.  IR INFUSION THROMBOL ARTERIAL INITIAL (MS)  Result Date: 06/01/2020 INDICATION: Bilateral submassive pulmonary emboli.  See consultation. EXAM: 1. ULTRASOUND GUIDANCE FOR VENOUS ACCESS X2 2. FLUOROSCOPIC GUIDED PLACEMENT OF BILATERAL PULMONARY ARTERIAL LYTIC INFUSION CATHETERS COMPARISON:  Chest CTA-05/30/2020 MEDICATIONS: Versed 3 mg IV Sedation time: 60 minutes CONTRAST:  None required COMPLICATIONS: None immediate TECHNIQUE: Informed written consent was obtained from the patient after a discussion of the risks, benefits and alternatives to treatment. Questions regarding the procedure were encouraged and answered. A timeout was performed prior to the initiation of the procedure. Ultrasound scanning was performed of the right IJ vein, patency confirmed and documented. As such, the right internal jugular vein was selected for vascular access. The right neck was prepped and draped in the usual sterile fashion, and a sterile drape was applied covering the operative field. Maximum barrier sterile technique with sterile gowns and gloves were  used for the procedure. A timeout was performed prior to the initiation of the procedure. Local anesthesia was provided with 1%  lidocaine. Under direct ultrasound guidance, the right internal jugular vein was accessed with a micro puncture sheath ultimately allowing placement of a 6 French vascular sheath. Slightly cranial to this initial access, the right internal jugular vein was again accessed with a micropuncture sheath ultimately allowing placement of a 6 French vascular sheath. With the use of a Bentson wire, an angled pigtail catheter was advanced into the right main pulmonary artery. Pressure measurements were then obtained from the proximal right pulmonary artery. Over an exchange length wire, the pigtail catheter was exchanged for a 90/20 cm multi side-hole infusion catheter. With the use of a Bentson wire, a pigtail catheter was advanced into the left main pulmonary artery. Over an exchange length wire, the pigtail catheter was exchanged for a 90/10 cm multi side-hole assisted infusion catheter. A postprocedural fluoroscopic image was obtained of the check demonstrating final catheter positioning. Both vascular sheath were secured at the right neck with 0 silk suture. The external catheter tubing was secured at the right chest and the lytic therapy was initiated. The patient tolerated the procedure well without immediate postprocedural complication. FINDINGS: Acquired pressure measurements: Right main pulmonary artery-40/37 (38) mmHg (normal: < 25/10) Following the procedure, both infusion catheter tips terminate within the distal aspects of the bilateral lower lobe sub segmental pulmonary arteries. IMPRESSION: 1. Successful fluoroscopic guided initiation of bilateral catheter directed pulmonary arterial lysis for sub massive pulmonary embolism and right-sided heart strain. 2. Elevated pressure measurements within the right main pulmonary artery compatible with pulmonary arterial hypertension. PLAN:  -infuse x 10 hours in ICU -recheck pulmonary arterial pressures and probable catheter removal Electronically Signed   By: Corlis Leak M.D.   On: 06/01/2020 15:22    Labs:  CBC: Recent Labs    06/01/20 0312 06/01/20 2200 06/02/20 0435 06/02/20 1158  WBC 12.4* 11.1* 11.1* 10.4  HGB 13.5 13.7 12.8 13.3  HCT 43.9 44.9 42.2 43.9  PLT 327 310 277 250    COAGS: Recent Labs    07/17/19 1601 07/18/19 0449 05/30/20 0935 05/31/20 0333 06/01/20 2200 06/02/20 0435 06/02/20 0738 06/02/20 1158  INR 1.5* 1.5* 1.0 1.6*  --   --   --   --   APTT  --   --   --   --  40* 124* 35 43*    BMP: Recent Labs    07/24/19 0451 07/24/19 0451 08/04/19 0017 08/04/19 0017 08/05/19 0401 08/05/19 0401 08/07/19 0415 08/07/19 0415 05/29/20 1713 05/30/20 0257 05/31/20 0333 06/01/20 0312  NA 136   < > 139   < > 139   < > 136   < > 139 136 137 137  K 4.5   < > 3.8   < > 4.4   < > 4.1   < > 4.4 4.0 4.1 4.2  CL 100   < > 99   < > 102   < > 104   < > 106 104 106 107  CO2 26   < > 26   < > 26   < > 21*   < > 22 20* 21* 21*  GLUCOSE 129*   < > 116*   < > 125*   < > 94   < > 206* 172* 143* 170*  BUN 26*   < > 24*   < > 14   < > 10   < > 24* 21* 16 19  CALCIUM 9.2   < > 9.2   < > 8.8*   < >  8.7*   < > 9.1 8.8* 8.2* 8.5*  CREATININE 0.82   < > 1.17*   < > 1.02*   < > 0.81   < > 0.96 0.92 0.75 1.00  GFRNONAA >60   < > 55*   < > >60   < > >60   < > >60 >60 >60 >60  GFRAA >60  --  >60  --  >60  --  >60  --   --   --   --   --    < > = values in this interval not displayed.    LIVER FUNCTION TESTS: Recent Labs    07/18/19 0449 07/19/19 0416 07/23/19 0425 05/29/20 1713  BILITOT 0.6 0.7 0.2* 0.4  AST 30 48* 23 18  ALT 16 23 27 17   ALKPHOS 79 74 88 76  PROT 6.6 6.3* 7.3 6.8  ALBUMIN 2.2* 2.1* 2.5* 3.3*    Assessment and Plan: Pulmonary embolus Patient s/p PE lysis via R IJ by Dr. .  She has completed infusion, now saline through line.  Sheaths intact, no bleeding or oozing.  Reports  improvement in breathing.  Still on [place] at 4-5 L/min with O2 sats 94-95%.  Plan to obtain pressures and remove sheath today.  IR following.    Electronically Signed: Deanne Coffer, PA 06/02/2020, 12:30 PM   I spent a total of 15 Minutes at the the patient's bedside AND on the patient's hospital floor or unit, greater than 50% of which was counseling/coordinating care for pulmonary embolus.

## 2020-06-02 NOTE — Progress Notes (Signed)
ANTICOAGULATION CONSULT NOTE  Pharmacy Consult for heparin  Indication: pulmonary embolus  Allergies  Allergen Reactions  . Covid-19 Mrna Vacc (Moderna) Swelling and Other (See Comments)    Arm developed blisters and a sunburned appearance and throat/tonsils were swollen the next morning  . Tape     redness, irritation, blistering, painful    Patient Measurements: Height: 5\' 6"  (167.6 cm) Weight: (!) 161.8 kg (356 lb 11.3 oz) IBW/kg (Calculated) : 59.3 Heparin Dosing Weight: 100kg  Vital Signs: Temp: 98.1 F (36.7 C) (10/11 1955) Temp Source: Oral (10/11 1955) BP: 156/85 (10/11 2000) Pulse Rate: 99 (10/11 2000)  Labs: Recent Labs    05/31/20 0333 05/31/20 0333 06/01/20 0312 06/01/20 2153 06/01/20 2200 06/02/20 0435 06/02/20 0435 06/02/20 0738 06/02/20 1158 06/02/20 1630 06/02/20 2000  HGB 13.3   < > 13.5  --    < > 12.8   < >  --  13.3 13.1  --   HCT 43.3   < > 43.9  --    < > 42.2  --   --  43.9 43.6  --   PLT 307   < > 327  --    < > 277  --   --  250 253  --   APTT  --   --   --   --    < > 124*   < > 35 43* 104*  --   LABPROT 18.6*  --   --   --   --   --   --   --   --   --   --   INR 1.6*  --   --   --   --   --   --   --   --   --   --   HEPARINUNFRC  --   --   --  0.80*  --   --   --   --  0.42  --  0.28*  CREATININE 0.75  --  1.00  --   --   --   --   --  0.83  --   --    < > = values in this interval not displayed.    Estimated Creatinine Clearance: 131.2 mL/min (by C-G formula based on SCr of 0.83 mg/dL).  Assessment: Morbidly obese pt who was admitted for a new PE. She has a prior history of it in 2018 and is off of anticoagulation. She was started on heparin but has had some IV issue was changed SQ lovenox then xarelto. She is status post catheter directed lysis and OK to resume normal heparin goal per ID.  -Xarelto 15 mg given at 9am on 10/10  Heparin level this evening is just below goal at 0.28, aptt is elevated. It appears xarelto has cleared  so will stop aptt checks. H/H is normal and no overt s/sx of bleeding. Transitioned to warfarin this afternoon.   Goal of Therapy:  Heparin level goal 0.3-0.7 INR goal 2-3 Monitor platelets by anticoagulation protocol: Yes   Plan:  -Increase heparin to 1200 units/hr -Re-check heparin level in am  12/10 PharmD., BCPS Clinical Pharmacist 06/02/2020 8:45 PM

## 2020-06-02 NOTE — Progress Notes (Signed)
ANTICOAGULATION CONSULT NOTE  Pharmacy Consult for heparin  Indication: pulmonary embolus  Allergies  Allergen Reactions  . Covid-19 Mrna Vacc (Moderna) Swelling and Other (See Comments)    Arm developed blisters and a sunburned appearance and throat/tonsils were swollen the next morning    Patient Measurements: Height: 5\' 6"  (167.6 cm) Weight: (!) 161.8 kg (356 lb 11.3 oz) IBW/kg (Calculated) : 59.3 Heparin Dosing Weight: 100kg  Vital Signs: Temp: 98.1 F (36.7 C) (10/10 2354) Temp Source: Oral (10/10 2354) BP: 117/79 (10/11 0000) Pulse Rate: 108 (10/11 0000)  Labs: Recent Labs    05/30/20 0257 05/30/20 0257 05/30/20 0935 05/31/20 0333 05/31/20 0333 06/01/20 0312 06/01/20 2153 06/01/20 2200  HGB 14.4   < >  --  13.3   < > 13.5  --  13.7  HCT 46.5*   < >  --  43.3  --  43.9  --  44.9  PLT 362   < >  --  307  --  327  --  310  APTT  --   --   --   --   --   --   --  40*  LABPROT  --   --  13.1 18.6*  --   --   --   --   INR  --   --  1.0 1.6*  --   --   --   --   HEPARINUNFRC  --   --   --   --   --   --  0.80*  --   CREATININE 0.92  --   --  0.75  --  1.00  --   --    < > = values in this interval not displayed.    Estimated Creatinine Clearance: 108.9 mL/min (by C-G formula based on SCr of 1 mg/dL).   Medical History: Past Medical History:  Diagnosis Date  . COVID-19   . Hypertension 2015  . IBS (irritable bowel syndrome)   . Pulmonary embolism (HCC)     Medications:  Medications Prior to Admission  Medication Sig Dispense Refill Last Dose  . ALPRAZolam (XANAX) 0.5 MG tablet Take 0.5 mg by mouth 3 (three) times daily.    05/29/2020 at am  . ARIPiprazole (ABILIFY) 5 MG tablet Take 5 mg by mouth daily.   05/29/2020 at am  . aspirin EC 81 MG tablet Take 81 mg by mouth daily.   05/29/2020 at 1100  . benazepril (LOTENSIN) 20 MG tablet Take 20 mg by mouth daily.   05/29/2020 at Unknown time  . busPIRone (BUSPAR) 30 MG tablet Take 30 mg by mouth 3 (three) times  daily.    05/29/2020 at am  . desvenlafaxine (PRISTIQ) 100 MG 24 hr tablet Take 100 mg by mouth daily.   05/29/2020 at am  . gabapentin (NEURONTIN) 300 MG capsule Take 600-900 mg by mouth See admin instructions. Take 600 mg by mouth in the morning, 600 mg midday, and 900 mg at bedtime  0 05/29/2020 at am  . naproxen (NAPROSYN) 500 MG tablet Take 500 mg by mouth 2 (two) times daily as needed (for pain and/or inflammation).    Past Week at Unknown time  . ondansetron (ZOFRAN) 8 MG tablet Take 8 mg by mouth every 8 (eight) hours as needed for nausea or vomiting.    unk at 07/29/2020  . tiZANidine (ZANAFLEX) 4 MG tablet Take 4 mg by mouth every 8 (eight) hours as needed for muscle spasms.  0 unk at unk  . traMADol (ULTRAM) 50 MG tablet Take 50-100 mg by mouth See admin instructions. Take 50-100 mg by mouth one to two times a day as needed for pain   05/28/2020 at Unknown time  . zonisamide (ZONEGRAN) 100 MG capsule Take 100 mg by mouth daily.    05/29/2020 at Unknown time   Scheduled:  . ALPRAZolam  0.5 mg Oral TID  . ARIPiprazole  5 mg Oral Daily  . busPIRone  30 mg Oral TID  . Chlorhexidine Gluconate Cloth  6 each Topical Daily  . gabapentin  600 mg Oral BID   And  . gabapentin  900 mg Oral QHS  . insulin aspart  0-5 Units Subcutaneous QHS  . insulin aspart  0-9 Units Subcutaneous TID WC  . lidocaine (PF)      . metFORMIN  500 mg Oral BID WC  . midazolam      . sodium chloride flush  10-40 mL Intracatheter Q12H  . sodium chloride flush  3 mL Intravenous Q12H  . sodium chloride flush  3 mL Intravenous Q12H  . sodium chloride flush  3 mL Intravenous Q12H  . Thrombi-Pad  1 each Topical STAT  . venlafaxine XR  150 mg Oral Q breakfast  . zonisamide  100 mg Oral Daily   Infusions:  . sodium chloride    . sodium chloride    . sodium chloride    . sodium chloride    . alteplase (tPA/ ACTIVASE) PE Lysis 12 mg/250 mL (BILATERAL) 20.8 mL/hr at 06/01/20 2300  . alteplase (tPA/ ACTIVASE) PE Lysis 12 mg/250  mL (BILATERAL) 20.8 mL/hr at 06/01/20 2300  . heparin 800 Units/hr (06/01/20 2300)    Assessment: Morbidly obese pt who was admitted for a new PE. She has a prior history of it in 2018 and is off of anticoagulation. She was started on heparin but has had some IV issue was changed SQ lovenox then xarelto. She is catheter directed lysis and plans for low dose heparin (to start now per Dr. Deanne Coffer) -enoxaparin 150mg  given 10/8 at ~ 11am -Xarelto 15 mg given at 9am on 10/10 -hg= 13.5  10/11 AM update:  APTT is low at 40, no issue per RN  Goal of Therapy:  aPTT 66-85 Monitor platelets by anticoagulation protocol: Yes   Plan:  -Inc heparin to 1000 units/hr -Re-check heparin level and aPTT in 6 hours -Monitor for bleeding  12-03-1989, PharmD, BCPS Clinical Pharmacist Phone: [phone]
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
## 2020-06-02 NOTE — Progress Notes (Signed)
Received pt from 47M to 4E09. VSS. Refused CHG bath- prefers to have bath in AM. Pt updated family on phone of her new location and plan of care. Will continue to monitor. No other needs at this time.  Margarito Liner, RN

## 2020-06-03 DIAGNOSIS — I82402 Acute embolism and thrombosis of unspecified deep veins of left lower extremity: Secondary | ICD-10-CM | POA: Diagnosis not present

## 2020-06-03 LAB — CBC
HCT: 43.7 % (ref 36.0–46.0)
Hemoglobin: 13.2 g/dL (ref 12.0–15.0)
MCH: 27.3 pg (ref 26.0–34.0)
MCHC: 30.2 g/dL (ref 30.0–36.0)
MCV: 90.3 fL (ref 80.0–100.0)
Platelets: 264 10*3/uL (ref 150–400)
RBC: 4.84 MIL/uL (ref 3.87–5.11)
RDW: 15.7 % — ABNORMAL HIGH (ref 11.5–15.5)
WBC: 11 10*3/uL — ABNORMAL HIGH (ref 4.0–10.5)
nRBC: 0.5 % — ABNORMAL HIGH (ref 0.0–0.2)

## 2020-06-03 LAB — CARDIOLIPIN ANTIBODIES, IGG, IGM, IGA
Anticardiolipin IgA: <9 APL U/mL (ref 0–11)
Anticardiolipin IgG: <9 GPL U/mL (ref 0–14)
Anticardiolipin IgM: 12 MPL U/mL (ref 0–12)

## 2020-06-03 LAB — PROTIME-INR
INR: 1.5 — ABNORMAL HIGH (ref 0.8–1.2)
Prothrombin Time: 17.9 seconds — ABNORMAL HIGH (ref 11.4–15.2)

## 2020-06-03 LAB — HEPARIN LEVEL (UNFRACTIONATED)
Heparin Unfractionated: 1.98 IU/mL — ABNORMAL HIGH (ref 0.30–0.70)
Heparin Unfractionated: 0.15 IU/mL — ABNORMAL LOW (ref 0.30–0.70)
Heparin Unfractionated: 0.2 IU/mL — ABNORMAL LOW (ref 0.30–0.70)

## 2020-06-03 LAB — GLUCOSE, CAPILLARY
Glucose-Capillary: 151 mg/dL — ABNORMAL HIGH (ref 70–99)
Glucose-Capillary: 153 mg/dL — ABNORMAL HIGH (ref 70–99)
Glucose-Capillary: 175 mg/dL — ABNORMAL HIGH (ref 70–99)
Glucose-Capillary: 180 mg/dL — ABNORMAL HIGH (ref 70–99)

## 2020-06-03 LAB — HOMOCYSTEINE: Homocysteine: 6.5 umol/L (ref 0.0–14.5)

## 2020-06-03 MED ORDER — WARFARIN SODIUM 7.5 MG PO TABS
7.5000 mg | ORAL_TABLET | Freq: Once | ORAL | Status: AC
  Administered 2020-06-03: 7.5 mg via ORAL
  Filled 2020-06-03: qty 1

## 2020-06-03 MED ORDER — METFORMIN HCL 500 MG PO TABS
500.0000 mg | ORAL_TABLET | Freq: Two times a day (BID) | ORAL | Status: DC
  Administered 2020-06-03 – 2020-06-09 (×12): 500 mg via ORAL
  Filled 2020-06-03 (×12): qty 1

## 2020-06-03 MED ORDER — HEPARIN (PORCINE) 25000 UT/250ML-% IV SOLN
2550.0000 [IU]/h | INTRAVENOUS | Status: DC
  Administered 2020-06-03 (×2): 1450 [IU]/h via INTRAVENOUS
  Administered 2020-06-04: 2000 [IU]/h via INTRAVENOUS
  Administered 2020-06-05: 2550 [IU]/h via INTRAVENOUS
  Administered 2020-06-05: 2500 [IU]/h via INTRAVENOUS
  Administered 2020-06-06 – 2020-06-07 (×4): 2550 [IU]/h via INTRAVENOUS
  Filled 2020-06-03 (×9): qty 250

## 2020-06-03 NOTE — Progress Notes (Signed)
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 Units/hr (06/03/20 0946)     LOS: 5 days    Time spent: 25  Rhetta Mura, MD [facility] To contact the attending provider between 7A-7P or the covering provider during after hours 7P-7A, please log into the web site [email or web address] and access using universal [place] password for that web site. If you do not have the password, please call the hospital operator.  06/03/2020, 12:12 PM

## 2020-06-03 NOTE — Progress Notes (Signed)
Spoke with pt at bedside who states that she has gone to her primary provider for INR needs in the past and would prefer to continue to see primary care for coumadin needs- call made to [facility]- and appointment made for [facility]. Oct. 14 at 2pm- (if needed can change this appointment if pt is not discharged tomorrow)

## 2020-06-03 NOTE — Progress Notes (Signed)
Visit made to patients room to discuss wearing CPAP tonight.  Patient states she wants to wait til she has a sleep study before that is initiated.

## 2020-06-03 NOTE — Progress Notes (Signed)
ANTICOAGULATION CONSULT NOTE  Pharmacy Consult for heparin  Indication: pulmonary embolus  Allergies  Allergen Reactions  . Covid-19 Mrna Vacc (Moderna) Swelling and Other (See Comments)    Arm developed blisters and a sunburned appearance and throat/tonsils were swollen the next morning  . Tape     redness, irritation, blistering, painful    Patient Measurements: Height: 5\' 6"  (167.6 cm) Weight: (!) 161.8 kg (356 lb 11.3 oz) IBW/kg (Calculated) : 59.3 Heparin Dosing Weight: 100kg  Vital Signs: Temp: 98.4 F (36.9 C) (10/12 1629) Temp Source: Oral (10/12 1629) BP: 142/90 (10/12 1632) Pulse Rate: 109 (10/12 1632)  Labs: Recent Labs    06/01/20 0312 06/01/20 2153 06/02/20 0435 06/02/20 0738 06/02/20 1158 06/02/20 1158 06/02/20 1630 06/02/20 2000 06/03/20 0416 06/03/20 0619 06/03/20 1600  HGB 13.5   < >   < >  --  13.3   < > 13.1  --  13.2  --   --   HCT 43.9   < >   < >  --  43.9  --  43.6  --  43.7  --   --   PLT 327   < >   < >  --  250  --  253  --  264  --   --   APTT  --    < >  --  35 43*  --  104*  --   --   --   --   LABPROT  --   --   --   --   --   --   --   --  17.9*  --   --   INR  --   --   --   --   --   --   --   --  1.5*  --   --   HEPARINUNFRC  --    < >  --   --  0.42  --   --    < > 1.98* 0.15* 0.20*  CREATININE 1.00  --   --   --  0.83  --   --   --   --   --   --    < > = values in this interval not displayed.    Estimated Creatinine Clearance: 129.8 mL/min (by C-G formula based on SCr of 0.83 mg/dL).  Assessment: Morbidly obese pt who was admitted for a new PE. She has a prior history of it in 2018 and is off of anticoagulation. She was started on heparin but has had some IV issue was changed SQ lovenox then xarelto (Xarelto 15 mg given at 9am on 10/10). She is status post catheter directed lysis and OK to resume normal heparin goal per MD. Warfarin started on 10/11 -heparin level= 0.15 on 1200 units/hr -hg= 12.2  PM f/u > heparin level  still below therapeutic range (0.2) despite rate increase earlier today. No known issues with IV infusion, no overt bleeding or complications noted.  Goal of Therapy:  Heparin level goal 0.3-0.7 INR goal 2-3 Monitor platelets by anticoagulation protocol: Yes   Plan:  -Increase heparin to 1600 Units/hr -Heparin level in 6 hours and daily wth CBC daily. -Will need INR > 2 x 24 hrs before stopping IV heparin.  12/11, Reece Leader, BCCP Clinical Pharmacist  06/03/2020 5:13 PM   [facility] pharmacy phone numbers are listed on [email or web address]

## 2020-06-03 NOTE — Plan of Care (Signed)
Continue to monitor

## 2020-06-03 NOTE — Progress Notes (Signed)
ANTICOAGULATION CONSULT NOTE  Pharmacy Consult for heparin  Indication: pulmonary embolus  Allergies  Allergen Reactions  . Covid-19 Mrna Vacc (Moderna) Swelling and Other (See Comments)    Arm developed blisters and a sunburned appearance and throat/tonsils were swollen the next morning  . Tape     redness, irritation, blistering, painful    Patient Measurements: Height: 5\' 6"  (167.6 cm) Weight: (!) 161.8 kg (356 lb 11.3 oz) IBW/kg (Calculated) : 59.3 Heparin Dosing Weight: 100kg  Vital Signs: Temp: 98.2 F (36.8 C) (10/12 0815) Temp Source: Oral (10/12 0815) BP: 114/73 (10/12 0815) Pulse Rate: 101 (10/12 0815)  Labs: Recent Labs    06/01/20 0312 06/01/20 2153 06/02/20 0435 06/02/20 0738 06/02/20 1158 06/02/20 1158 06/02/20 1630 06/02/20 2000 06/03/20 0416 06/03/20 0619  HGB 13.5   < >   < >  --  13.3   < > 13.1  --  13.2  --   HCT 43.9   < >   < >  --  43.9  --  43.6  --  43.7  --   PLT 327   < >   < >  --  250  --  253  --  264  --   APTT  --    < >  --  35 43*  --  104*  --   --   --   LABPROT  --   --   --   --   --   --   --   --  17.9*  --   INR  --   --   --   --   --   --   --   --  1.5*  --   HEPARINUNFRC  --    < >  --   --  0.42   < >  --  0.28* 1.98* 0.15*  CREATININE 1.00  --   --   --  0.83  --   --   --   --   --    < > = values in this interval not displayed.    Estimated Creatinine Clearance: 129.8 mL/min (by C-G formula based on SCr of 0.83 mg/dL).  Assessment: Morbidly obese pt who was admitted for a new PE. She has a prior history of it in 2018 and is off of anticoagulation. She was started on heparin but has had some IV issue was changed SQ lovenox then xarelto (Xarelto 15 mg given at 9am on 10/10). She is status post catheter directed lysis and OK to resume normal heparin goal per MD. Warfarin started on 10/11 -heparin level= 0.15 on 1200 units/hr -hg= 12.2  Goal of Therapy:  Heparin level goal 0.3-0.7 INR goal 2-3 Monitor platelets  by anticoagulation protocol: Yes   Plan:  -Increase heparin to 1450 Units/hr -Heparin level in 6 hours and daily wth CBC daily -Warfarin 7.5mg  today -Daily PT/INR  12/11, PharmD Clinical Pharmacist **Pharmacist phone directory can now be found on [email or web address] ([email or web address]).  Listed under [facility].

## 2020-06-03 NOTE — Discharge Instructions (Signed)

## 2020-06-04 DIAGNOSIS — I82402 Acute embolism and thrombosis of unspecified deep veins of left lower extremity: Secondary | ICD-10-CM | POA: Diagnosis not present

## 2020-06-04 DIAGNOSIS — E1142 Type 2 diabetes mellitus with diabetic polyneuropathy: Secondary | ICD-10-CM

## 2020-06-04 DIAGNOSIS — G8929 Other chronic pain: Secondary | ICD-10-CM

## 2020-06-04 DIAGNOSIS — M549 Dorsalgia, unspecified: Secondary | ICD-10-CM | POA: Diagnosis not present

## 2020-06-04 DIAGNOSIS — Z8659 Personal history of other mental and behavioral disorders: Secondary | ICD-10-CM | POA: Diagnosis not present

## 2020-06-04 DIAGNOSIS — I749 Embolism and thrombosis of unspecified artery: Secondary | ICD-10-CM | POA: Diagnosis not present

## 2020-06-04 LAB — PROTEIN S, TOTAL: Protein S Ag, Total: 113 % (ref 60–150)

## 2020-06-04 LAB — CBC
HCT: 42 % (ref 36.0–46.0)
Hemoglobin: 12.7 g/dL (ref 12.0–15.0)
MCH: 27.1 pg (ref 26.0–34.0)
MCHC: 30.2 g/dL (ref 30.0–36.0)
MCV: 89.7 fL (ref 80.0–100.0)
Platelets: 285 10*3/uL (ref 150–400)
RBC: 4.68 MIL/uL (ref 3.87–5.11)
RDW: 15.7 % — ABNORMAL HIGH (ref 11.5–15.5)
WBC: 10.6 10*3/uL — ABNORMAL HIGH (ref 4.0–10.5)
nRBC: 0.5 % — ABNORMAL HIGH (ref 0.0–0.2)

## 2020-06-04 LAB — URINALYSIS, ROUTINE W REFLEX MICROSCOPIC
Bilirubin Urine: NEGATIVE
Glucose, UA: NEGATIVE mg/dL
Hgb urine dipstick: NEGATIVE
Ketones, ur: NEGATIVE mg/dL
Nitrite: NEGATIVE
Protein, ur: NEGATIVE mg/dL
Specific Gravity, Urine: 1.018 (ref 1.005–1.030)
pH: 6 (ref 5.0–8.0)

## 2020-06-04 LAB — HEPARIN LEVEL (UNFRACTIONATED)
Heparin Unfractionated: 0.15 IU/mL — ABNORMAL LOW (ref 0.30–0.70)
Heparin Unfractionated: 0.2 IU/mL — ABNORMAL LOW (ref 0.30–0.70)
Heparin Unfractionated: 0.34 IU/mL (ref 0.30–0.70)
Heparin Unfractionated: 0.75 IU/mL — ABNORMAL HIGH (ref 0.30–0.70)
Heparin Unfractionated: 1.86 IU/mL — ABNORMAL HIGH (ref 0.30–0.70)

## 2020-06-04 LAB — GLUCOSE, CAPILLARY
Glucose-Capillary: 135 mg/dL — ABNORMAL HIGH (ref 70–99)
Glucose-Capillary: 160 mg/dL — ABNORMAL HIGH (ref 70–99)
Glucose-Capillary: 144 mg/dL — ABNORMAL HIGH (ref 70–99)
Glucose-Capillary: 130 mg/dL — ABNORMAL HIGH (ref 70–99)

## 2020-06-04 LAB — LUPUS ANTICOAGULANT PANEL
DRVVT: 45.5 s (ref 0.0–47.0)
PTT Lupus Anticoagulant: 66.5 s — ABNORMAL HIGH (ref 0.0–51.9)

## 2020-06-04 LAB — PROTEIN C ACTIVITY: Protein C Activity: 76 % (ref 73–180)

## 2020-06-04 LAB — PROTEIN S ACTIVITY: Protein S Activity: 52 % — ABNORMAL LOW (ref 63–140)

## 2020-06-04 LAB — COMPREHENSIVE METABOLIC PANEL
ALT: 18 U/L (ref 0–44)
AST: 13 U/L — ABNORMAL LOW (ref 15–41)
Albumin: 2.7 g/dL — ABNORMAL LOW (ref 3.5–5.0)
Alkaline Phosphatase: 78 U/L (ref 38–126)
Anion gap: 9 (ref 5–15)
BUN: 14 mg/dL (ref 6–20)
CO2: 26 mmol/L (ref 22–32)
Calcium: 8.6 mg/dL — ABNORMAL LOW (ref 8.9–10.3)
Chloride: 102 mmol/L (ref 98–111)
Creatinine, Ser: 0.9 mg/dL (ref 0.44–1.00)
GFR, Estimated: >60 mL/min (ref >60)
Glucose, Bld: 146 mg/dL — ABNORMAL HIGH (ref 70–99)
Potassium: 4 mmol/L (ref 3.5–5.1)
Sodium: 137 mmol/L (ref 135–145)
Total Bilirubin: 0.6 mg/dL (ref 0.3–1.2)
Total Protein: 6.2 g/dL — ABNORMAL LOW (ref 6.5–8.1)

## 2020-06-04 LAB — PROTIME-INR
INR: 1.6 — ABNORMAL HIGH (ref 0.8–1.2)
Prothrombin Time: 18.2 seconds — ABNORMAL HIGH (ref 11.4–15.2)

## 2020-06-04 LAB — PTT-LA MIX: PTT-LA Mix: 62.8 s — ABNORMAL HIGH (ref 0.0–48.9)

## 2020-06-04 LAB — BETA-2-GLYCOPROTEIN I ABS, IGG/M/A
Beta-2 Glyco I IgG: <9 GPI IgG units (ref 0–20)
Beta-2-Glycoprotein I IgA: <9 GPI IgA units (ref 0–25)
Beta-2-Glycoprotein I IgM: <9 GPI IgM units (ref 0–32)

## 2020-06-04 LAB — PROTHROMBIN GENE MUTATION

## 2020-06-04 LAB — PROTEIN C, TOTAL: Protein C, Total: 69 % (ref 60–150)

## 2020-06-04 LAB — HEXAGONAL PHASE PHOSPHOLIPID: Hexagonal Phase Phospholipid: 0 s (ref 0–11)

## 2020-06-04 MED ORDER — HEPARIN BOLUS VIA INFUSION
1500.0000 [IU] | Freq: Once | INTRAVENOUS | Status: AC
  Administered 2020-06-04: 1500 [IU] via INTRAVENOUS
  Filled 2020-06-04: qty 1500

## 2020-06-04 MED ORDER — WARFARIN SODIUM 10 MG PO TABS
10.0000 mg | ORAL_TABLET | Freq: Once | ORAL | Status: AC
  Administered 2020-06-04: 10 mg via ORAL
  Filled 2020-06-04 (×2): qty 1

## 2020-06-04 MED ORDER — HEPARIN BOLUS VIA INFUSION
2500.0000 [IU] | Freq: Once | INTRAVENOUS | Status: AC
  Administered 2020-06-04: 2500 [IU] via INTRAVENOUS
  Filled 2020-06-04: qty 2500

## 2020-06-04 NOTE — Plan of Care (Signed)
Continue to monitor

## 2020-06-04 NOTE — Progress Notes (Addendum)
ANTICOAGULATION CONSULT NOTE  Pharmacy Consult for Heparin  Indication: pulmonary embolus  Allergies  Allergen Reactions  . Covid-19 Mrna Vacc (Moderna) Swelling and Other (See Comments)    Arm developed blisters and a sunburned appearance and throat/tonsils were swollen the next morning  . Tape     redness, irritation, blistering, painful    Patient Measurements: Height: 5\' 6"  (167.6 cm) Weight: (!) 161.8 kg (356 lb 11.3 oz) IBW/kg (Calculated) : 59.3 Heparin Dosing Weight: 100 kg  Vital Signs: Temp: 98 F (36.7 C) (10/13 1700) Temp Source: Oral (10/13 1700) BP: 152/90 (10/13 1700) Pulse Rate: 101 (10/13 1700)  Labs: Recent Labs    06/02/20 0435 06/02/20 0738 06/02/20 1158 06/02/20 1158 06/02/20 1630 06/02/20 2000 06/03/20 0416 06/03/20 0619 06/04/20 0800 06/04/20 1530 06/04/20 1656  HGB   < >  --  13.3   < > 13.1   < > 13.2  --  12.7  --   --   HCT   < >  --  43.9   < > 43.6  --  43.7  --  42.0  --   --   PLT   < >  --  250   < > 253  --  264  --  285  --   --   APTT  --  35 43*  --  104*  --   --   --   --   --   --   LABPROT  --   --   --   --   --   --  17.9*  --  18.2*  --   --   INR  --   --   --   --   --   --  1.5*  --  1.6*  --   --   HEPARINUNFRC  --   --  0.42  --   --    < > 1.98*   < > 0.34 1.86* 0.20*  CREATININE  --   --  0.83  --   --   --   --   --  0.90  --   --    < > = values in this interval not displayed.    Estimated Creatinine Clearance: 119.7 mL/min (by C-G formula based on SCr of 0.9 mg/dL).  Assessment: 49 yr old female with PE on heparin and S/P catheter-directed lysis, now on heparin and warfarin (day 3 overlap).  Heparin level earlier today was therapeutic (0.34 units/ml) on heparin infusion at 2000 units/hr; heparin infusion continued at this rate and confirmatory level was ordered for this afternoon. Initial confirmatory heparin level resulted at 1.86 units/ml; however, RN reported that heparin infusion was held for only 2-3  mins before drawing heparin level from midline PICC, so heparin level likely inaccurate. Repeat heparin level, drawn by lab via peripheral stick, was 0.20 units/ml, which is below the goal range for this pt. Per RN, no issues with IV; RN reported that pt has small amount of blood in nose, due to dry nose, which has not increased. H/H, platelets WNL and stable. INR earlier today was 1.6 (trending up).   Due to multiple instances of heparin level having to be redrawn after being drawn from PICC line, will change heparin levels to be drawn by lab via peripheral stick.  Goal of Therapy:  Heparin level goal 0.3-0.7 units/ml Monitor platelets by anticoagulation protocol: Yes   Plan:  Heparin 1500 units IV bolus X  1 Increase heparin infusion to 2200 units/hr Check 6-hr heparin level (via peripheral stick by lab) Warfarin 10 mg PO X 1 today - completed Monitor daily heparin level, INR, CBC Monitor for signs/symptoms of bleeding  Vicki Mallet, PharmD, BCPS, [facility] Clinical Pharmacist 06/04/20, 17:58 PM

## 2020-06-04 NOTE — Progress Notes (Signed)
ANTICOAGULATION CONSULT NOTE  Pharmacy Consult for heparin  Indication: pulmonary embolus  Allergies  Allergen Reactions  . Covid-19 Mrna Vacc (Moderna) Swelling and Other (See Comments)    Arm developed blisters and a sunburned appearance and throat/tonsils were swollen the next morning  . Tape     redness, irritation, blistering, painful    Patient Measurements: Height: 5\' 6"  (167.6 cm) Weight: (!) 161.8 kg (356 lb 11.3 oz) IBW/kg (Calculated) : 59.3 Heparin Dosing Weight: 100kg  Vital Signs: Temp: 98 F (36.7 C) (10/13 0756) Temp Source: Oral (10/13 0756) BP: 149/81 (10/13 1054) Pulse Rate: 102 (10/13 1054)  Labs: Recent Labs    06/02/20 0435 06/02/20 0738 06/02/20 1158 06/02/20 1158 06/02/20 1630 06/02/20 2000 06/03/20 0416 06/03/20 0619 06/03/20 2330 06/04/20 0214 06/04/20 0800  HGB   < >  --  13.3   < > 13.1   < > 13.2  --   --   --  12.7  HCT   < >  --  43.9   < > 43.6  --  43.7  --   --   --  42.0  PLT   < >  --  250   < > 253  --  264  --   --   --  285  APTT  --  35 43*  --  104*  --   --   --   --   --   --   LABPROT  --   --   --   --   --   --  17.9*  --   --   --  18.2*  INR  --   --   --   --   --   --  1.5*  --   --   --  1.6*  HEPARINUNFRC  --   --  0.42  --   --    < > 1.98*   < > 0.15* 0.75* 0.34  CREATININE  --   --  0.83  --   --   --   --   --   --   --  0.90   < > = values in this interval not displayed.    Estimated Creatinine Clearance: 119.7 mL/min (by C-G formula based on SCr of 0.9 mg/dL).  Assessment: 49 y.o. female with PE on heparin and status post catheter directed lysis now on heparin and warfarin (day 3 overlap). -heparin level at goal -INR= 1.6 with trend up  Goal of Therapy:  Heparin level goal 0.3-0.7 Monitor platelets by anticoagulation protocol: Yes   Plan:  -Continue heparin at 2000 units/hr -Confirm heparin level later today -heparin level and CBC daily -Warfarin 10mg  po today -PT/INR daily  54,  PharmD Clinical Pharmacist **Pharmacist phone directory can now be found on [email or web address] ([email or web address]).  Listed under [facility].

## 2020-06-04 NOTE — Progress Notes (Signed)
ANTICOAGULATION CONSULT NOTE  Pharmacy Consult for heparin  Indication: pulmonary embolus  Allergies  Allergen Reactions  . Covid-19 Mrna Vacc (Moderna) Swelling and Other (See Comments)    Arm developed blisters and a sunburned appearance and throat/tonsils were swollen the next morning  . Tape     redness, irritation, blistering, painful    Patient Measurements: Height: 5\' 6"  (167.6 cm) Weight: (!) 161.8 kg (356 lb 11.3 oz) IBW/kg (Calculated) : 59.3 Heparin Dosing Weight: 100kg  Vital Signs: Temp: 98.9 F (37.2 C) (10/12 2334) Temp Source: Oral (10/12 2334) BP: 129/74 (10/12 2334) Pulse Rate: 105 (10/12 2334)  Labs: Recent Labs    06/01/20 0312 06/01/20 2153 06/02/20 0435 06/02/20 0738 06/02/20 1158 06/02/20 1158 06/02/20 1630 06/02/20 2000 06/03/20 0416 06/03/20 0416 06/03/20 0619 06/03/20 1600 06/03/20 2330  HGB 13.5   < >   < >  --  13.3   < > 13.1  --  13.2  --   --   --   --   HCT 43.9   < >   < >  --  43.9  --  43.6  --  43.7  --   --   --   --   PLT 327   < >   < >  --  250  --  253  --  264  --   --   --   --   APTT  --    < >  --  35 43*  --  104*  --   --   --   --   --   --   LABPROT  --   --   --   --   --   --   --   --  17.9*  --   --   --   --   INR  --   --   --   --   --   --   --   --  1.5*  --   --   --   --   HEPARINUNFRC  --    < >  --   --  0.42  --   --    < > 1.98*   < > 0.15* 0.20* 0.15*  CREATININE 1.00  --   --   --  0.83  --   --   --   --   --   --   --   --    < > = values in this interval not displayed.    Estimated Creatinine Clearance: 129.8 mL/min (by C-G formula based on SCr of 0.83 mg/dL).  Assessment: 49 y.o. female with PE for heparin  Goal of Therapy:  Heparin level goal 0.3-0.7 Monitor platelets by anticoagulation protocol: Yes   Plan:  Heparin 2500 units IV bolus, then increase heparin 2000 units/hr Check heparin level in 6 hours.  54, PharmD, BCPS
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
## 2020-06-04 NOTE — Progress Notes (Signed)
Patient states that when she urinates it burns. No other symptoms. Dr. Gerri Lins paged via [facility] system. Will continue to monitor

## 2020-06-05 DIAGNOSIS — I82402 Acute embolism and thrombosis of unspecified deep veins of left lower extremity: Secondary | ICD-10-CM | POA: Diagnosis not present

## 2020-06-05 DIAGNOSIS — I749 Embolism and thrombosis of unspecified artery: Secondary | ICD-10-CM | POA: Diagnosis not present

## 2020-06-05 DIAGNOSIS — Z8659 Personal history of other mental and behavioral disorders: Secondary | ICD-10-CM | POA: Diagnosis not present

## 2020-06-05 DIAGNOSIS — M549 Dorsalgia, unspecified: Secondary | ICD-10-CM | POA: Diagnosis not present

## 2020-06-05 LAB — COMPREHENSIVE METABOLIC PANEL
ALT: 16 U/L (ref 0–44)
AST: 14 U/L — ABNORMAL LOW (ref 15–41)
Albumin: 2.8 g/dL — ABNORMAL LOW (ref 3.5–5.0)
Alkaline Phosphatase: 76 U/L (ref 38–126)
Anion gap: 8 (ref 5–15)
BUN: 15 mg/dL (ref 6–20)
CO2: 25 mmol/L (ref 22–32)
Calcium: 8.8 mg/dL — ABNORMAL LOW (ref 8.9–10.3)
Chloride: 102 mmol/L (ref 98–111)
Creatinine, Ser: 0.97 mg/dL (ref 0.44–1.00)
GFR, Estimated: >60 mL/min (ref >60)
Glucose, Bld: 157 mg/dL — ABNORMAL HIGH (ref 70–99)
Potassium: 4 mmol/L (ref 3.5–5.1)
Sodium: 135 mmol/L (ref 135–145)
Total Bilirubin: 0.4 mg/dL (ref 0.3–1.2)
Total Protein: 6.4 g/dL — ABNORMAL LOW (ref 6.5–8.1)

## 2020-06-05 LAB — GLUCOSE, CAPILLARY
Glucose-Capillary: 177 mg/dL — ABNORMAL HIGH (ref 70–99)
Glucose-Capillary: 160 mg/dL — ABNORMAL HIGH (ref 70–99)
Glucose-Capillary: 126 mg/dL — ABNORMAL HIGH (ref 70–99)
Glucose-Capillary: 135 mg/dL — ABNORMAL HIGH (ref 70–99)

## 2020-06-05 LAB — CBC
HCT: 41.8 % (ref 36.0–46.0)
Hemoglobin: 13 g/dL (ref 12.0–15.0)
MCH: 27.7 pg (ref 26.0–34.0)
MCHC: 31.1 g/dL (ref 30.0–36.0)
MCV: 89.1 fL (ref 80.0–100.0)
Platelets: 282 10*3/uL (ref 150–400)
RBC: 4.69 MIL/uL (ref 3.87–5.11)
RDW: 15.8 % — ABNORMAL HIGH (ref 11.5–15.5)
WBC: 11.6 10*3/uL — ABNORMAL HIGH (ref 4.0–10.5)
nRBC: 0.4 % — ABNORMAL HIGH (ref 0.0–0.2)

## 2020-06-05 LAB — URINE CULTURE

## 2020-06-05 LAB — PROTIME-INR
INR: 1.5 — ABNORMAL HIGH (ref 0.8–1.2)
Prothrombin Time: 17.5 seconds — ABNORMAL HIGH (ref 11.4–15.2)

## 2020-06-05 LAB — HEPARIN LEVEL (UNFRACTIONATED)
Heparin Unfractionated: 0.27 IU/mL — ABNORMAL LOW (ref 0.30–0.70)
Heparin Unfractionated: 0.3 IU/mL (ref 0.30–0.70)

## 2020-06-05 MED ORDER — HEPARIN BOLUS VIA INFUSION
1500.0000 [IU] | Freq: Once | INTRAVENOUS | Status: AC
  Administered 2020-06-05: 1500 [IU] via INTRAVENOUS
  Filled 2020-06-05: qty 1500

## 2020-06-05 MED ORDER — WARFARIN SODIUM 10 MG PO TABS
10.0000 mg | ORAL_TABLET | Freq: Once | ORAL | Status: AC
  Administered 2020-06-05: 10 mg via ORAL
  Filled 2020-06-05: qty 1

## 2020-06-05 MED ORDER — WARFARIN SODIUM 10 MG PO TABS: 10.0000 mg | ORAL_TABLET | Freq: Once | ORAL | Status: DC

## 2020-06-05 NOTE — Evaluation (Signed)
Physical Therapy Evaluation Patient Details Name: Holly Moon MRN: [id number] DOB: Feb 27, 1971 Today's Date: 06/05/2020   History of Present Illness  Pt presented with SOB and found to have LLE DVT and submassive PE. Pt underwent catheter directed pulmonary arterial lysis on 10/12. PMH - morbid obesity, perforated colon, abdominal abscesses, PE, covid, DM, chronic back pain, depression, anxiety.   Clinical Impression  Pt admitted with above diagnosis and presents to PT with functional limitations due to deficits listed below (See PT problem list). Pt needs skilled PT to maximize independence and safety to allow discharge to home with family. Currently primarily limited by respiratory status.      Follow Up Recommendations Home health PT (May not need if progressess quickly)    Equipment Recommendations  None recommended by PT    Recommendations for Other Services       Precautions / Restrictions Precautions Precautions: Other (comment) Precaution Comments: watch SpO2      Mobility  Bed Mobility Overal bed mobility: Modified Independent             General bed mobility comments: Incr time  Transfers Overall transfer level: Needs assistance Equipment used: None;Rolling walker (2 wheeled) Transfers: Sit to/from Stand Sit to Stand: Min guard;Supervision         General transfer comment: Initial stand from bed with min guard for safety. Repeated sit to stands from chair with supervision  Ambulation/Gait Ambulation/Gait assistance: Min guard Gait Distance (Feet): 25 Feet Assistive device: Rolling walker (2 wheeled) Gait Pattern/deviations: Step-to pattern;Decreased step length - right;Decreased step length - left;Shuffle;Wide base of support Gait velocity: decr Gait velocity interpretation: <1.31 ft/sec, indicative of household ambulator General Gait Details: Assist for safety and lines  Stairs            Wheelchair Mobility    Modified Rankin  (Stroke Patients Only)       Balance Overall balance assessment: Mild deficits observed, not formally tested                                           Pertinent Vitals/Pain Pain Assessment: 0-10 Pain Score: 2  Pain Location: generalized Pain Descriptors / Indicators: Sore Pain Intervention(s): Monitored during session;Repositioned    Home Living Family/patient expects to be discharged to:: Private residence Living Arrangements: Spouse/significant other Available Help at Discharge: Family;Available PRN/intermittently Type of Home: House Home Access: Stairs to enter Entrance Stairs-Rails: Right Entrance Stairs-Number of Steps: 2-3 Home Layout: One level Home Equipment: Walker - 2 wheels;Cane - single point;Walker - 4 wheels      Prior Function Level of Independence: Independent with assistive device(s)         Comments: Uses cane at times     Hand Dominance        Extremity/Trunk Assessment   Upper Extremity Assessment Upper Extremity Assessment: Defer to OT evaluation    Lower Extremity Assessment Lower Extremity Assessment: Defer to PT evaluation       Communication   Communication: No difficulties  Cognition Arousal/Alertness: Awake/alert Behavior During Therapy: WFL for tasks assessed/performed Overall Cognitive Status: Within Functional Limits for tasks assessed                                        General Comments General comments (skin integrity, edema, etc.):  At rest pt on 5L O2 via mask with SpO2 95% and HR 100's. With amb on 6L, SpO2 87% and HR 130's.     Exercises Other Exercises Other Exercises: Repeated sit to stand x 3   Assessment/Plan    PT Assessment Patient needs continued PT services  PT Problem List Decreased strength;Decreased activity tolerance;Decreased mobility;Cardiopulmonary status limiting activity;Obesity       PT Treatment Interventions DME instruction;Gait training;Functional  mobility training;Therapeutic activities;Therapeutic exercise;Patient/family education    PT Goals (Current goals can be found in the Care Plan section)  Acute Rehab PT Goals Patient Stated Goal: get better PT Goal Formulation: With patient Time For Goal Achievement: 06/12/20 Potential to Achieve Goals: Good    Frequency Min 3X/week   Barriers to discharge        Co-evaluation               AM-PAC PT "6 Clicks" Mobility  Outcome Measure Help needed turning from your back to your side while in a flat bed without using bedrails?: None Help needed moving from lying on your back to sitting on the side of a flat bed without using bedrails?: None Help needed moving to and from a bed to a chair (including a wheelchair)?: A Little Help needed standing up from a chair using your arms (e.g., wheelchair or bedside chair)?: A Little Help needed to walk in hospital room?: A Little Help needed climbing 3-5 steps with a railing? : A Little 6 Click Score: 20    End of Session Equipment Utilized During Treatment: Oxygen Activity Tolerance: Treatment limited secondary to medical complications (Comment);Patient limited by fatigue (dyspnea and incr HR) Patient left: in chair;with call bell/phone within reach Nurse Communication: Mobility status PT Visit Diagnosis: Other abnormalities of gait and mobility (R26.89);Muscle weakness (generalized) (M62.81)    Time: 1610-9604 PT Time Calculation (min) (ACUTE ONLY): 35 min   Charges:   PT Evaluation $PT Eval Moderate Complexity: 1 Mod PT Treatments $Gait Training: 8-22 mins        [facility] PT Acute Rehabilitation Services Pager [phone] Office [phone]   Angelina Ok [facility] 06/05/2020, 10:09 AM
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
## 2020-06-05 NOTE — Progress Notes (Signed)
ANTICOAGULATION CONSULT NOTE  Pharmacy Consult for Heparin  Indication: pulmonary embolus  Allergies  Allergen Reactions  . Covid-19 Mrna Vacc (Moderna) Swelling and Other (See Comments)    Arm developed blisters and a sunburned appearance and throat/tonsils were swollen the next morning  . Tape     redness, irritation, blistering, painful    Patient Measurements: Height: 5\' 6"  (167.6 cm) Weight: (!) 161.8 kg (356 lb 11.3 oz) IBW/kg (Calculated) : 59.3 Heparin Dosing Weight: 100 kg  Vital Signs: Temp: 99.1 F (37.3 C) (10/14 1136) Temp Source: Axillary (10/14 1136) BP: 153/95 (10/14 1136) Pulse Rate: 103 (10/14 1136)  Labs: Recent Labs    06/02/20 1630 06/02/20 2000 06/03/20 0416 06/03/20 0619 06/04/20 0800 06/04/20 1530 06/04/20 1656 06/05/20 0054 06/05/20 1118  HGB 13.1   < > 13.2   < > 12.7  --   --  13.0  --   HCT 43.6   < > 43.7  --  42.0  --   --  41.8  --   PLT 253   < > 264  --  285  --   --  282  --   APTT 104*  --   --   --   --   --   --   --   --   LABPROT  --   --  17.9*  --  18.2*  --   --  17.5*  --   INR  --   --  1.5*  --  1.6*  --   --  1.5*  --   HEPARINUNFRC  --    < > 1.98*   < > 0.34   < > 0.20* 0.27* 0.30  CREATININE  --   --   --   --  0.90  --   --  0.97  --    < > = values in this interval not displayed.    Estimated Creatinine Clearance: 111.1 mL/min (by C-G formula based on SCr of 0.97 mg/dL).  Assessment: 49 yr old female with PE on heparin and S/P catheter-directed lysis, now on heparin and warfarin. -heparin level= 0.3 on 2500 units/hr -INR= 1.5 -CBC stable   Goal of Therapy:  Heparin level goal 0.3-0.7 units/ml Monitor platelets by anticoagulation protocol: Yes   Plan:  -Increase heparin to 2550 units/hr to keep in goal -Warfarin 10mg  po today (dose increased 10/13) -Daily heparin level, CBC and heparin level  , PharmD Clinical Pharmacist **Pharmacist phone directory can now be found on [email or web address] ([email or web address]).  Listed under [facility].

## 2020-06-05 NOTE — Progress Notes (Signed)
ANTICOAGULATION CONSULT NOTE  Pharmacy Consult for Heparin  Indication: pulmonary embolus  Allergies  Allergen Reactions  . Covid-19 Mrna Vacc (Moderna) Swelling and Other (See Comments)    Arm developed blisters and a sunburned appearance and throat/tonsils were swollen the next morning  . Tape     redness, irritation, blistering, painful    Patient Measurements: Height: 5\' 6"  (167.6 cm) Weight: (!) 161.8 kg (356 lb 11.3 oz) IBW/kg (Calculated) : 59.3 Heparin Dosing Weight: 100 kg  Vital Signs: Temp: 98.2 F (36.8 C) (10/13 2315) Temp Source: Oral (10/13 2315) BP: 156/96 (10/13 2315) Pulse Rate: 105 (10/13 2316)  Labs: Recent Labs    06/02/20 0435 06/02/20 0738 06/02/20 1158 06/02/20 1158 06/02/20 1630 06/02/20 2000 06/03/20 0416 06/03/20 0619 06/04/20 0800 06/04/20 0800 06/04/20 1530 06/04/20 1656 06/05/20 0054  HGB   < >  --  13.3   < > 13.1   < > 13.2   < > 12.7  --   --   --  13.0  HCT   < >  --  43.9   < > 43.6   < > 43.7  --  42.0  --   --   --  41.8  PLT   < >  --  250   < > 253   < > 264  --  285  --   --   --  282  APTT  --  35 43*  --  104*  --   --   --   --   --   --   --   --   LABPROT  --   --   --   --   --   --  17.9*  --  18.2*  --   --   --   --   INR  --   --   --   --   --   --  1.5*  --  1.6*  --   --   --   --   HEPARINUNFRC  --   --  0.42  --   --    < > 1.98*   < > 0.34   < > 1.86* 0.20* 0.27*  CREATININE  --   --  0.83  --   --   --   --   --  0.90  --   --   --   --    < > = values in this interval not displayed.    Estimated Creatinine Clearance: 119.7 mL/min (by C-G formula based on SCr of 0.9 mg/dL).  Assessment: 49 yr old female with PE on heparin and S/P catheter-directed lysis, now on heparin and warfarin.  Heparin level subtherapeutic (0.27) on gtt at 2200 units/hr. No issues with line or bleeding reported per RN.  Due to multiple instances of heparin level having to be redrawn after being drawn from PICC line, will change  heparin levels to be drawn by lab via peripheral stick.  Goal of Therapy:  Heparin level goal 0.3-0.7 units/ml Monitor platelets by anticoagulation protocol: Yes   Plan:  Heparin 1500 units IV bolus X 1 Increase heparin infusion to 2500 units/hr Check 6-hr heparin level (via peripheral stick by lab)  54, PharmD, BCPS Please see [email or web address] for complete clinical pharmacist phone list 06/05/2020 2:05 AM

## 2020-06-05 NOTE — Evaluation (Signed)
Occupational Therapy Evaluation Patient Details Name: Holly Moon MRN: [id number] DOB: Jul 09, 1971 Today's Date: 06/05/2020    History of Present Illness Pt presented with SOB and found to have LLE DVT and submassive PE. Pt underwent catheter directed pulmonary arterial lysis on 10/12. PMH - morbid obesity, perforated colon, abdominal abscesses, PE, covid, DM, chronic back pain, depression, anxiety.    Clinical Impression   Patient admitted for the above diagnosis.  Increased SOB, O2 requirement and decreased activity tolerance are the main barriers to her prior level of function.  She is close to her baseline for ADL and functional mobility.  Patient did de-sat to 92% on 5 L via [place], but generally rebounded to 98% with cues to rest and purse lip breath.  She did not need O2 at home, she did drive and, has a cane for mobility as needed.  Otherwise, she is pretty independent with her ADL, mobility, meal prep and home management performance.  OT discussed use of shower chair at home, she verbalized understanding.  No further needs in the acute setting.  No further recommendations for home.      Follow Up Recommendations  No OT follow up    Equipment Recommendations  Tub/shower seat    Recommendations for Other Services       Precautions / Restrictions Precautions Precautions: Fall Precaution Comments: watch SpO2 Restrictions Weight Bearing Restrictions: No      Mobility Bed Mobility               General bed mobility comments: Up in recliner  Transfers   Equipment used: None Transfers: Sit to/from Stand;Stand Pivot Transfers Sit to Stand: Supervision Stand pivot transfers: Supervision            Balance Overall balance assessment: Mild deficits observed, not formally tested                                         ADL either performed or assessed with clinical judgement   ADL Overall ADL's : Modified independent                                        General ADL Comments: Increased time and effort due to SOB and increased O2 needs.     Vision Baseline Vision/History: Wears glasses Wears Glasses: At all times Patient Visual Report: No change from baseline       Perception     Praxis      Pertinent Vitals/Pain Pain Assessment: Faces Pain Score: 2  Pain Location: generalized Pain Descriptors / Indicators: Sore Pain Intervention(s): Monitored during session     Hand Dominance     Extremity/Trunk Assessment Upper Extremity Assessment Upper Extremity Assessment: Overall WFL for tasks assessed   Lower Extremity Assessment Lower Extremity Assessment: Defer to PT evaluation   Cervical / Trunk Assessment Cervical / Trunk Assessment: Normal   Communication     Cognition Arousal/Alertness: Awake/alert Behavior During Therapy: WFL for tasks assessed/performed Overall Cognitive Status: Within Functional Limits for tasks assessed  Home Living Family/patient expects to be discharged to:: Private residence Living Arrangements: Spouse/significant other Available Help at Discharge: Family;Available PRN/intermittently Type of Home: House Home Access: Stairs to enter [employer] of Steps: 2-3 Entrance Stairs-Rails: Right Home Layout: One level     Bathroom Shower/Tub: Tub/shower unit                                       OT Problem List: Decreased activity tolerance      OT Treatment/Interventions:      OT Goals(Current goals can be found in the care plan section) Acute Rehab OT Goals Patient Stated Goal: I'd like to wean my oxygen down, go home. OT Goal Formulation: With patient Time For Goal Achievement: 06/12/20 Potential to Achieve Goals: Good  OT Frequency:     Barriers to D/C:  medical status          Co-evaluation              AM-PAC OT "6 Clicks" Daily Activity      Outcome Measure Help from another person eating meals?: None Help from another person taking care of personal grooming?: None Help from another person toileting, which includes using toliet, bedpan, or urinal?: None Help from another person bathing (including washing, rinsing, drying)?: A Little (lines and leads) Help from another person to put on and taking off regular upper body clothing?: None Help from another person to put on and taking off regular lower body clothing?: A Little (lines and leads) 6 Click Score: 22   End of Session Equipment Utilized During Treatment: Oxygen Nurse Communication: Mobility status  Activity Tolerance: Patient tolerated treatment well Patient left: in chair;with call bell/phone within reach  OT Visit Diagnosis: Unsteadiness on feet (R26.81)                Time: 8588-5027 OT Time Calculation (min): 30 min Charges:  OT General Charges $OT Visit: 1 Visit OT Evaluation $OT Eval Moderate Complexity: 1 Mod OT Treatments $Self Care/Home Management : 8-22 mins  06/05/2020  Rich, OTR/L  Acute Rehabilitation Services  Office:  [phone]   Suzanna Obey 06/05/2020, 12:30 PM

## 2020-06-06 DIAGNOSIS — I749 Embolism and thrombosis of unspecified artery: Secondary | ICD-10-CM | POA: Diagnosis not present

## 2020-06-06 DIAGNOSIS — Z8659 Personal history of other mental and behavioral disorders: Secondary | ICD-10-CM | POA: Diagnosis not present

## 2020-06-06 DIAGNOSIS — M549 Dorsalgia, unspecified: Secondary | ICD-10-CM | POA: Diagnosis not present

## 2020-06-06 DIAGNOSIS — I82402 Acute embolism and thrombosis of unspecified deep veins of left lower extremity: Secondary | ICD-10-CM | POA: Diagnosis not present

## 2020-06-06 LAB — COMPREHENSIVE METABOLIC PANEL
ALT: 19 U/L (ref 0–44)
AST: 13 U/L — ABNORMAL LOW (ref 15–41)
Albumin: 2.6 g/dL — ABNORMAL LOW (ref 3.5–5.0)
Alkaline Phosphatase: 68 U/L (ref 38–126)
Anion gap: 8 (ref 5–15)
BUN: 14 mg/dL (ref 6–20)
CO2: 25 mmol/L (ref 22–32)
Calcium: 8.6 mg/dL — ABNORMAL LOW (ref 8.9–10.3)
Chloride: 101 mmol/L (ref 98–111)
Creatinine, Ser: 0.86 mg/dL (ref 0.44–1.00)
GFR, Estimated: >60 mL/min (ref >60)
Glucose, Bld: 156 mg/dL — ABNORMAL HIGH (ref 70–99)
Potassium: 3.9 mmol/L (ref 3.5–5.1)
Sodium: 134 mmol/L — ABNORMAL LOW (ref 135–145)
Total Bilirubin: 0.2 mg/dL — ABNORMAL LOW (ref 0.3–1.2)
Total Protein: 5.9 g/dL — ABNORMAL LOW (ref 6.5–8.1)

## 2020-06-06 LAB — GLUCOSE, CAPILLARY
Glucose-Capillary: 118 mg/dL — ABNORMAL HIGH (ref 70–99)
Glucose-Capillary: 197 mg/dL — ABNORMAL HIGH (ref 70–99)
Glucose-Capillary: 141 mg/dL — ABNORMAL HIGH (ref 70–99)
Glucose-Capillary: 131 mg/dL — ABNORMAL HIGH (ref 70–99)

## 2020-06-06 LAB — CBC
HCT: 38.5 % (ref 36.0–46.0)
Hemoglobin: 11.8 g/dL — ABNORMAL LOW (ref 12.0–15.0)
MCH: 27.1 pg (ref 26.0–34.0)
MCHC: 30.6 g/dL (ref 30.0–36.0)
MCV: 88.3 fL (ref 80.0–100.0)
Platelets: 285 10*3/uL (ref 150–400)
RBC: 4.36 MIL/uL (ref 3.87–5.11)
RDW: 15.4 % (ref 11.5–15.5)
WBC: 10.1 10*3/uL (ref 4.0–10.5)
nRBC: 0.2 % (ref 0.0–0.2)

## 2020-06-06 LAB — HEPARIN LEVEL (UNFRACTIONATED)
Heparin Unfractionated: 1.76 IU/mL — ABNORMAL HIGH (ref 0.30–0.70)
Heparin Unfractionated: 0.37 IU/mL (ref 0.30–0.70)

## 2020-06-06 LAB — FACTOR 5 LEIDEN

## 2020-06-06 LAB — PROTIME-INR
INR: 2.2 — ABNORMAL HIGH (ref 0.8–1.2)
Prothrombin Time: 23.7 seconds — ABNORMAL HIGH (ref 11.4–15.2)

## 2020-06-06 MED ORDER — CEPHALEXIN 500 MG PO CAPS
500.0000 mg | ORAL_CAPSULE | Freq: Three times a day (TID) | ORAL | Status: AC
  Administered 2020-06-06 – 2020-06-09 (×9): 500 mg via ORAL
  Filled 2020-06-06 (×9): qty 1

## 2020-06-06 MED ORDER — WARFARIN SODIUM 5 MG PO TABS
5.0000 mg | ORAL_TABLET | Freq: Once | ORAL | Status: AC
  Administered 2020-06-06: 5 mg via ORAL
  Filled 2020-06-06: qty 1

## 2020-06-06 MED ORDER — DOCUSATE SODIUM 100 MG PO CAPS
200.0000 mg | ORAL_CAPSULE | Freq: Once | ORAL | Status: AC
  Administered 2020-06-06: 200 mg via ORAL
  Filled 2020-06-06: qty 2

## 2020-06-06 NOTE — Progress Notes (Signed)
ANTICOAGULATION CONSULT NOTE  Pharmacy Consult for Heparin  Indication: pulmonary embolus  Allergies  Allergen Reactions  . Covid-19 Mrna Vacc (Moderna) Swelling and Other (See Comments)    Arm developed blisters and a sunburned appearance and throat/tonsils were swollen the next morning  . Tape     redness, irritation, blistering, painful    Patient Measurements: Height: 5\' 6"  (167.6 cm) Weight: (!) 161.8 kg (356 lb 11.3 oz) IBW/kg (Calculated) : 59.3 Heparin Dosing Weight: 100 kg  Vital Signs: Temp: 97.9 F (36.6 C) (10/15 0809) Temp Source: Oral (10/15 0809) BP: 154/93 (10/15 0809) Pulse Rate: 94 (10/15 0809)  Labs: Recent Labs    06/04/20 0800 06/04/20 1530 06/05/20 0054 06/05/20 0054 06/05/20 1118 06/06/20 0429 06/06/20 0652  HGB 12.7  --  13.0  --   --  11.8*  --   HCT 42.0  --  41.8  --   --  38.5  --   PLT 285  --  282  --   --  285  --   LABPROT 18.2*  --  17.5*  --   --  23.7*  --   INR 1.6*  --  1.5*  --   --  2.2*  --   HEPARINUNFRC 0.34   < > 0.27*   < > 0.30 1.76* 0.37  CREATININE 0.90  --  0.97  --   --  0.86  --    < > = values in this interval not displayed.    Estimated Creatinine Clearance: 125.3 mL/min (by C-G formula based on SCr of 0.86 mg/dL).  Assessment: 49 yr old female with PE on heparin and S/P catheter-directed lysis, now on heparin and warfarin. -heparin level= 0.3 on 2500 units/hr -INR=2.1 (day 5 overlap), heparin level at goal    Goal of Therapy:  Heparin level goal 0.3-0.7 units/ml Monitor platelets by anticoagulation protocol: Yes   Plan:  -No heparin rate changes needed -Could discontinue heparin with day 5 overlap and INR > 2 -Warfarin 5mg  po today -Would discharge on warfarin 7.5mg  po daily with INR follow up on Monday if possible  , PharmD Clinical Pharmacist **Pharmacist phone directory can now be found on [email or web address] ([email or web address]).  Listed under [facility].

## 2020-06-06 NOTE — Progress Notes (Signed)
Physical Therapy Treatment Patient Details Name: Holly Moon MRN: [id number] DOB: 02-17-1971 Today's Date: 06/06/2020    History of Present Illness Pt presented with SOB and found to have LLE DVT and submassive PE. Pt underwent catheter directed pulmonary arterial lysis on 10/12. PMH - morbid obesity, perforated colon, abdominal abscesses, PE, covid, DM, chronic back pain, depression, anxiety.     PT Comments    Pt making steady progress but still limited by dyspnea and low SpO2 with activity. Pt on 6L O2 and SpO2 drops to 85% with activity. Will continue to follow and progress pt as tolerated.    Follow Up Recommendations  Home health PT     Equipment Recommendations  None recommended by PT    Recommendations for Other Services       Precautions / Restrictions Precautions Precautions: Other (comment) Precaution Comments: watch SpO2 Restrictions Weight Bearing Restrictions: No    Mobility  Bed Mobility Overal bed mobility: Modified Independent             General bed mobility comments: Incr time  Transfers Overall transfer level: Needs assistance Equipment used: None;4-wheeled walker Transfers: Sit to/from Stand Sit to Stand: Min guard         General transfer comment: Assist for safety/lines  Ambulation/Gait Ambulation/Gait assistance: Supervision Gait Distance (Feet): 90 Feet (90' x 1, 12' x 2) Assistive device: 4-wheeled walker Gait Pattern/deviations: Step-to pattern;Decreased step length - right;Decreased step length - left;Shuffle;Wide base of support Gait velocity: decr Gait velocity interpretation: <1.31 ft/sec, indicative of household ambulator General Gait Details: Assist for safety and lines   Stairs             Wheelchair Mobility    Modified Rankin (Stroke Patients Only)       Balance Overall balance assessment: No apparent balance deficits (not formally assessed)                                           Cognition Arousal/Alertness: Awake/alert Behavior During Therapy: WFL for tasks assessed/performed Overall Cognitive Status: Within Functional Limits for tasks assessed                                        Exercises      General Comments General comments (skin integrity, edema, etc.): Pt on 6L of O2. At rest SpO2 94%. With amb SpO2 85%. Dyspnea 3/4. HR to 120's.       Pertinent Vitals/Pain Pain Assessment: Faces Faces Pain Scale: Hurts a little bit Pain Location: neck Pain Descriptors / Indicators: Sore Pain Intervention(s): Monitored during session;Repositioned    Home Living                      Prior Function            PT Goals (current goals can now be found in the care plan section) Acute Rehab PT Goals Patient Stated Goal: get better Progress towards PT goals: Progressing toward goals    Frequency    Min 3X/week      PT Plan Current plan remains appropriate    Co-evaluation              AM-PAC PT "6 Clicks" Mobility   Outcome Measure  Help needed turning from your back to  your side while in a flat bed without using bedrails?: None Help needed moving from lying on your back to sitting on the side of a flat bed without using bedrails?: None Help needed moving to and from a bed to a chair (including a wheelchair)?: A Little Help needed standing up from a chair using your arms (e.g., wheelchair or bedside chair)?: A Little Help needed to walk in hospital room?: A Little Help needed climbing 3-5 steps with a railing? : A Little 6 Click Score: 20    End of Session Equipment Utilized During Treatment: Oxygen Activity Tolerance: Treatment limited secondary to medical complications (Comment);Patient limited by fatigue (dyspnea and incr HR) Patient left: in chair;with call bell/phone within reach Nurse Communication: Mobility status PT Visit Diagnosis: Other abnormalities of gait and mobility (R26.89);Muscle weakness  (generalized) (M62.81)     Time: 3662-9476 PT Time Calculation (min) (ACUTE ONLY): 41 min  Charges:  $Gait Training: 38-52 mins                     [facility] PT Acute Rehabilitation Services Pager [phone] Office [phone]    Angelina Ok [facility] 06/06/2020, 11:27 AM
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
## 2020-06-07 DIAGNOSIS — I82402 Acute embolism and thrombosis of unspecified deep veins of left lower extremity: Secondary | ICD-10-CM | POA: Diagnosis not present

## 2020-06-07 DIAGNOSIS — M549 Dorsalgia, unspecified: Secondary | ICD-10-CM | POA: Diagnosis not present

## 2020-06-07 DIAGNOSIS — I749 Embolism and thrombosis of unspecified artery: Secondary | ICD-10-CM | POA: Diagnosis not present

## 2020-06-07 DIAGNOSIS — Z8659 Personal history of other mental and behavioral disorders: Secondary | ICD-10-CM | POA: Diagnosis not present

## 2020-06-07 LAB — CBC
HCT: 39.9 % (ref 36.0–46.0)
Hemoglobin: 12.5 g/dL (ref 12.0–15.0)
MCH: 27.7 pg (ref 26.0–34.0)
MCHC: 31.3 g/dL (ref 30.0–36.0)
MCV: 88.5 fL (ref 80.0–100.0)
Platelets: 325 10*3/uL (ref 150–400)
RBC: 4.51 MIL/uL (ref 3.87–5.11)
RDW: 15.2 % (ref 11.5–15.5)
WBC: 10.4 10*3/uL (ref 4.0–10.5)
nRBC: 0.2 % (ref 0.0–0.2)

## 2020-06-07 LAB — GLUCOSE, CAPILLARY
Glucose-Capillary: 138 mg/dL — ABNORMAL HIGH (ref 70–99)
Glucose-Capillary: 147 mg/dL — ABNORMAL HIGH (ref 70–99)
Glucose-Capillary: 147 mg/dL — ABNORMAL HIGH (ref 70–99)
Glucose-Capillary: 130 mg/dL — ABNORMAL HIGH (ref 70–99)

## 2020-06-07 LAB — PROTIME-INR
INR: 2.3 — ABNORMAL HIGH (ref 0.8–1.2)
Prothrombin Time: 24.2 seconds — ABNORMAL HIGH (ref 11.4–15.2)

## 2020-06-07 LAB — HEPARIN LEVEL (UNFRACTIONATED): Heparin Unfractionated: 0.37 IU/mL (ref 0.30–0.70)

## 2020-06-07 MED ORDER — WARFARIN SODIUM 7.5 MG PO TABS
7.5000 mg | ORAL_TABLET | Freq: Every day | ORAL | Status: DC
  Administered 2020-06-07 – 2020-06-08 (×2): 7.5 mg via ORAL
  Filled 2020-06-07 (×2): qty 1
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
## 2020-06-07 NOTE — Progress Notes (Signed)
Physical Therapy Treatment Patient Details Name: Holly Moon MRN: [id number] DOB: Jul 28, 1971 Today's Date: 06/07/2020    History of Present Illness Pt presented with SOB and found to have LLE DVT and submassive PE. Pt underwent catheter directed pulmonary arterial lysis on 10/12. PMH - morbid obesity, perforated colon, abdominal abscesses, PE, covid, DM, chronic back pain, depression, anxiety.     PT Comments    Pt progressing towards physical therapy goals. Pt just finished working with mobility tech shortly before session but was willing to participate in therapeutic exercise with PT. Pt on 4L/min supplemental O2 throughout session and maintained O2 sats 93-95% throughout standing exercise. Pt dyspneic at times but was utilizing pursed-lip breathing technique well throughout session. Will continue to follow and progress as able per POC.    Follow Up Recommendations  Home health PT     Equipment Recommendations  None recommended by PT    Recommendations for Other Services       Precautions / Restrictions Precautions Precautions: Other (comment) Precaution Comments: watch SpO2 Restrictions Weight Bearing Restrictions: No    Mobility  Bed Mobility               General bed mobility comments: Pt was received sitting up in recliner chair  Transfers Overall transfer level: Needs assistance Equipment used: Rolling walker (2 wheeled) Transfers: Sit to/from Stand Sit to Stand: Min guard         General transfer comment: Close guard for safety. PT managed O2 and telemetry lines  Ambulation/Gait Ambulation/Gait assistance: Supervision Gait Distance (Feet): 20 Feet Assistive device: Rolling walker (2 wheeled) Gait Pattern/deviations: Step-to pattern;Decreased step length - right;Decreased step length - left;Shuffle;Wide base of support;Decreased stance time - left;Decreased weight shift to left Gait velocity: Decreased Gait velocity interpretation: <1.31  ft/sec, indicative of household ambulator General Gait Details: Pt ambulated in room only to sink for standing exercise and back to recliner.   Stairs             Wheelchair Mobility    Modified Rankin (Stroke Patients Only)       Balance Overall balance assessment: Mild deficits observed, not formally tested                                          Cognition Arousal/Alertness: Awake/alert Behavior During Therapy: WFL for tasks assessed/performed Overall Cognitive Status: Within Functional Limits for tasks assessed                                        Exercises General Exercises - Lower Extremity Long Arc Quad: Seated;15 reps Hip ABduction/ADduction: Standing;15 reps Straight Leg Raises: Standing;15 reps Heel Raises: Standing;15 reps Mini-Sqauts: 5 reps;Limitations Mini Squats Limitations: L knee pain - exercise ended after 5 reps Other Exercises Other Exercises: Hamstring curls x10 each side    General Comments        Pertinent Vitals/Pain Pain Assessment: Faces Faces Pain Scale: Hurts a little bit Pain Location: LLE Pain Descriptors / Indicators: Sore Pain Intervention(s): Limited activity within patient's tolerance;Monitored during session;Repositioned    Home Living                      Prior Function            PT Goals (current  goals can now be found in the care plan section) Acute Rehab PT Goals Patient Stated Goal: Home soon PT Goal Formulation: With patient Time For Goal Achievement: 06/12/20 Potential to Achieve Goals: Good Progress towards PT goals: Progressing toward goals    Frequency    Min 3X/week      PT Plan Current plan remains appropriate    Co-evaluation              AM-PAC PT "6 Clicks" Mobility   Outcome Measure  Help needed turning from your back to your side while in a flat bed without using bedrails?: None Help needed moving from lying on your back to sitting  on the side of a flat bed without using bedrails?: None Help needed moving to and from a bed to a chair (including a wheelchair)?: A Little Help needed standing up from a chair using your arms (e.g., wheelchair or bedside chair)?: A Little Help needed to walk in hospital room?: A Little Help needed climbing 3-5 steps with a railing? : A Little 6 Click Score: 20    End of Session Equipment Utilized During Treatment: Oxygen Activity Tolerance: Patient limited by fatigue Patient left: in chair;with call bell/phone within reach Nurse Communication: Mobility status PT Visit Diagnosis: Other abnormalities of gait and mobility (R26.89);Muscle weakness (generalized) (M62.81)     Time: 4315-4008 PT Time Calculation (min) (ACUTE ONLY): 19 min  Charges:  $Therapeutic Exercise: 8-22 mins                     Conni Slipper, PT, DPT Acute Rehabilitation Services Pager: [phone] Office: [phone]    Marylynn Pearson 06/07/2020, 2:23 PM

## 2020-06-07 NOTE — Progress Notes (Signed)
Mobility Specialist - Progress Note   06/07/20 1300  Mobility  Activity Ambulated in hall;Ambulated to bathroom  Level of Assistance Standby assist, set-up cues, supervision of patient - no hands on  Assistive Device Front wheel walker  Distance Ambulated (ft) 120 ft (Intervals: 100 ft x 1, 20 ft x 1)  Mobility Response Tolerated fair  Mobility performed by Mobility specialist  $Mobility charge 1 Mobility    Pre-mobility: 87 HR, 95% SpO2 During mobility: 130 HR, 92% SpO2 Post-mobility: 97 HR, 95% SpO2  Pt ambulated to toilet prior to going out in hall in order to have a BM, she required assistance for peri-care. She had visible DOE while ambulating, but was able to keep her SpO2 >90% w/ pursed lip breathing on 4 L O2. Pt in chair after walking to eat lunch.   Mamie Levers Mobility Specialist Mobility Specialist Phone: [phone]

## 2020-06-07 NOTE — Progress Notes (Signed)
ANTICOAGULATION CONSULT NOTE - Follow-Up  Pharmacy Consult for Heparin >> Warfarin Indication: pulmonary embolus and DVT  Allergies  Allergen Reactions  . Covid-19 Mrna Vacc (Moderna) Swelling and Other (See Comments)    Arm developed blisters and a sunburned appearance and throat/tonsils were swollen the next morning  . Tape     redness, irritation, blistering, painful    Patient Measurements: Height: 5\' 6"  (167.6 cm) Weight: (!) 161.8 kg (356 lb 11.3 oz) IBW/kg (Calculated) : 59.3 Heparin Dosing Weight: 100 kg  Vital Signs: Temp: 99 F (37.2 C) (10/16 0910) Temp Source: Oral (10/16 0910) BP: 127/83 (10/16 0910) Pulse Rate: 94 (10/16 0910)  Labs: Recent Labs    06/05/20 0054 06/05/20 1118 06/06/20 0429 06/06/20 0652 06/07/20 0425 06/07/20 0601  HGB 13.0  --  11.8*  --  12.5  --   HCT 41.8  --  38.5  --  39.9  --   PLT 282  --  285  --  325  --   LABPROT 17.5*  --  23.7*  --   --  24.2*  INR 1.5*  --  2.2*  --   --  2.3*  HEPARINUNFRC 0.27*   < > 1.76* 0.37  --  0.37  CREATININE 0.97  --  0.86  --   --   --    < > = values in this interval not displayed.    Estimated Creatinine Clearance: 125.3 mL/min (by C-G formula based on SCr of 0.86 mg/dL).  Assessment: 49 yr old female withDVT and PE s/p catheter-directed lysis, now on heparin and warfarin.  INR therapeutic today x 2.  Will discontinue heparin.  Goal of Therapy:  INR goal 2-3 Monitor platelets by anticoagulation protocol: Yes   Plan:  Discontinue heparin. Start warfarin 7.5mg  daily. Continue daily INR.  54, Pharm.D., BCPS Clinical Pharmacist Clinical phone for 06/07/2020 from 8:30-4:00 is [phone].  **Pharmacist phone directory can be found on [email or web address] listed under [facility].  06/07/2020 9:15 AM

## 2020-06-08 ENCOUNTER — Inpatient Hospital Stay (HOSPITAL_COMMUNITY): Payer: BC Managed Care – PPO

## 2020-06-08 ENCOUNTER — Other Ambulatory Visit (HOSPITAL_COMMUNITY): Payer: Self-pay | Admitting: Internal Medicine

## 2020-06-08 DIAGNOSIS — M549 Dorsalgia, unspecified: Secondary | ICD-10-CM | POA: Diagnosis not present

## 2020-06-08 DIAGNOSIS — I749 Embolism and thrombosis of unspecified artery: Secondary | ICD-10-CM | POA: Diagnosis not present

## 2020-06-08 DIAGNOSIS — Z8659 Personal history of other mental and behavioral disorders: Secondary | ICD-10-CM | POA: Diagnosis not present

## 2020-06-08 DIAGNOSIS — I82402 Acute embolism and thrombosis of unspecified deep veins of left lower extremity: Secondary | ICD-10-CM | POA: Diagnosis not present

## 2020-06-08 LAB — PROTIME-INR
INR: 2.2 — ABNORMAL HIGH (ref 0.8–1.2)
Prothrombin Time: 23.8 seconds — ABNORMAL HIGH (ref 11.4–15.2)

## 2020-06-08 LAB — GLUCOSE, CAPILLARY
Glucose-Capillary: 141 mg/dL — ABNORMAL HIGH (ref 70–99)
Glucose-Capillary: 145 mg/dL — ABNORMAL HIGH (ref 70–99)
Glucose-Capillary: 128 mg/dL — ABNORMAL HIGH (ref 70–99)
Glucose-Capillary: 108 mg/dL — ABNORMAL HIGH (ref 70–99)

## 2020-06-08 LAB — BASIC METABOLIC PANEL
Anion gap: 10 (ref 5–15)
BUN: 19 mg/dL (ref 6–20)
CO2: 25 mmol/L (ref 22–32)
Calcium: 9.2 mg/dL (ref 8.9–10.3)
Chloride: 102 mmol/L (ref 98–111)
Creatinine, Ser: 1.03 mg/dL — ABNORMAL HIGH (ref 0.44–1.00)
GFR, Estimated: >60 mL/min (ref >60)
Glucose, Bld: 139 mg/dL — ABNORMAL HIGH (ref 70–99)
Potassium: 4.2 mmol/L (ref 3.5–5.1)
Sodium: 137 mmol/L (ref 135–145)

## 2020-06-08 LAB — URINE CULTURE: Culture: <10000 — AB

## 2020-06-08 MED ORDER — METFORMIN HCL 500 MG PO TABS
500.0000 mg | ORAL_TABLET | Freq: Two times a day (BID) | ORAL | 0 refills | Status: DC
Start: 2020-06-08 — End: 2020-06-08

## 2020-06-08 MED ORDER — KETOROLAC TROMETHAMINE 60 MG/2ML IM SOLN: 30.0000 mg | Freq: Once | INTRAMUSCULAR | Status: DC

## 2020-06-08 MED ORDER — AMLODIPINE BESYLATE 5 MG PO TABS
5.0000 mg | ORAL_TABLET | Freq: Every day | ORAL | 0 refills | Status: DC
Start: 2020-06-09 — End: 2020-06-09

## 2020-06-08 MED ORDER — WARFARIN SODIUM 7.5 MG PO TABS
7.5000 mg | ORAL_TABLET | Freq: Every day | ORAL | 0 refills | Status: DC
Start: 2020-06-08 — End: 2020-06-08

## 2020-06-08 MED ORDER — CEPHALEXIN 500 MG PO CAPS: 500.0000 mg | ORAL_CAPSULE | Freq: Three times a day (TID) | ORAL | 0 refills | Status: DC

## 2020-06-08 MED ORDER — KETOROLAC TROMETHAMINE 30 MG/ML IJ SOLN
30.0000 mg | Freq: Once | INTRAMUSCULAR | Status: AC
  Administered 2020-06-08: 30 mg via INTRAVENOUS
  Filled 2020-06-08: qty 1

## 2020-06-08 NOTE — Progress Notes (Signed)
MD called back, see MAR for new order. Will continue to monitor, if it get worse can do scan in am pe rMD. Will continue to monitor.

## 2020-06-08 NOTE — Care Management (Signed)
Patient with new orders for O2 and HHPT this afternoon.  RN will walk patient but unsure if will qualify for Home O2.  Patient states she has an oxygen concentrator that was her deceased mother's that she may be able to use.  She would like to use [employer] for O2 needs so she may investigate that option.  Patient states she thinks she is able to go to outpatient PT and may prefer that to HHPT. She will discuss with husband and let us know tomorrow.

## 2020-06-08 NOTE — Progress Notes (Deleted)
SATURATION QUALIFICATIONS: (This note is used to comply with regulatory documentation for home oxygen)  Patient Saturations on Room Air at Rest = ***%  Patient Saturations on Room Air while Ambulating = ***%  Patient Saturations on *** Liters of oxygen while Ambulating = ***%  Please briefly explain why patient needs home oxygen: 
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
## 2020-06-08 NOTE — Progress Notes (Signed)
Called to pt room asking for ice pack for her right wrist pain. Pt stated pain started yesterday afternoon but getting worse.having hard time reaching and grabbing with right hand and throbbing pain is sometimes radiating to elbow. Radial pulse 2+, cap refill within normal limit. Able to move fingers but hurts. No swelling noted. On call [facility] [place] text paged, awaiting call back.
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
## 2020-06-09 DIAGNOSIS — I749 Embolism and thrombosis of unspecified artery: Secondary | ICD-10-CM | POA: Diagnosis not present

## 2020-06-09 DIAGNOSIS — M549 Dorsalgia, unspecified: Secondary | ICD-10-CM | POA: Diagnosis not present

## 2020-06-09 DIAGNOSIS — I82402 Acute embolism and thrombosis of unspecified deep veins of left lower extremity: Secondary | ICD-10-CM | POA: Diagnosis not present

## 2020-06-09 DIAGNOSIS — Z8659 Personal history of other mental and behavioral disorders: Secondary | ICD-10-CM | POA: Diagnosis not present

## 2020-06-09 LAB — GLUCOSE, CAPILLARY: Glucose-Capillary: 125 mg/dL — ABNORMAL HIGH (ref 70–99)

## 2020-06-09 LAB — PROTIME-INR
INR: 2.3 — ABNORMAL HIGH (ref 0.8–1.2)
Prothrombin Time: 24.2 seconds — ABNORMAL HIGH (ref 11.4–15.2)

## 2020-06-09 MED FILL — metFORMIN HCL 500 MG TABS: 500 | 30 days supply | Qty: 60 | Fill #0

## 2020-06-09 MED FILL — AMLODIPINE BESYLATE 5 MG TA: 5 | 30 days supply | Qty: 30 | Fill #0

## 2020-06-09 MED FILL — WARFARIN SODIUM 7.5 MG TAB: 7.5 | 30 days supply | Qty: 30 | Fill #0

## 2020-06-09 MED FILL — CEPHALEXIN 500 MG CAPS: 500 | 1 days supply | Qty: 3 | Fill #0

## 2020-06-09 NOTE — Progress Notes (Signed)
SATURATION QUALIFICATIONS: (This note is used to comply with regulatory documentation for home oxygen)  Patient Saturations on Room Air at Rest = 98-100%  Patient Saturations on Room Air while Ambulating = 98%  Patient Saturations on RA while Ambulating = 88-100%   pt ambulated 400 feet  with front wheel walker on RA. Pt o2 sat dropped to 88%, but did not sustain. o2 sat stayed 88-100 5 during walk.Pt was slightly SOB at the end of the walk, o2 sat did drop to 87% in RA while sitting in recliner right after walk, but sat came up to 95-97% in RA after deep breathing. Will continue to monitor.

## 2020-06-09 NOTE — Progress Notes (Addendum)
ANTICOAGULATION CONSULT NOTE - Follow-Up  Pharmacy Consult for Warfarin Indication: pulmonary embolus and DVT  Allergies  Allergen Reactions  . Covid-19 Mrna Vacc (Moderna) Swelling and Other (See Comments)    Arm developed blisters and a sunburned appearance and throat/tonsils were swollen the next morning  . Tape     redness, irritation, blistering, painful    Patient Measurements: Height: 5\' 6"  (167.6 cm) Weight: (!) 161.8 kg (356 lb 11.3 oz) IBW/kg (Calculated) : 59.3   Vital Signs: Temp: 98.1 F (36.7 C) (10/18 0832) Temp Source: Oral (10/18 0832) BP: 160/77 (10/18 0832) Pulse Rate: 99 (10/18 0832)  Labs: Recent Labs    06/07/20 0425 06/07/20 0601 06/08/20 0500 06/09/20 0500  HGB 12.5  --   --   --   HCT 39.9  --   --   --   PLT 325  --   --   --   LABPROT  --  24.2* 23.8* 24.2*  INR  --  2.3* 2.2* 2.3*  HEPARINUNFRC  --  0.37  --   --   CREATININE  --   --  1.03*  --     Estimated Creatinine Clearance: 104.6 mL/min (A) (by C-G formula based on SCr of 1.03 mg/dL (H)).  Assessment: 49 yr old female with 05/30/20 LLE DVT and saddle PE s/p catheter-directed lysis, started on warfarin.   INR continues to be therapeutic on warfarin 7.5mg  daily. Discharge planned today with PCP follow up at 14:30.   Goal of Therapy:  INR goal 2-3 Monitor platelets by anticoagulation protocol: Yes   Plan:  Discharge on warfarin 7.5mg  daily  F/u with PCP for INR check within 3-5 days - discussed with patient who states she will reschedule the appointment made for today   07/30/20, PharmD, BCPS, [facility] Clinical Pharmacist  Please check AMION for all [facility] Pharmacy phone numbers After 10:00 PM, call Main Pharmacy [phone]

## 2020-06-09 NOTE — Plan of Care (Signed)
DISCHARGE NOTE HOME Holly Moon to be discharged home per MD order. Discussed prescriptions and follow up appointments with the patient. Prescriptions given to patient; medication list explained in detail. Patient verbalized understanding.  Skin clean, dry and intact without evidence of skin break down noted. IV catheter discontinued intact. Site without signs and symptoms of complications. Dressing and pressure applied. Pt denies pain at the site currently. No complaints noted.  Patient free of lines, drains, and wounds.   An After Visit Summary (AVS) was printed and given to the patient. Patient escorted via wheelchair, and discharged home via private auto.  Arlice Colt, RN

## 2020-06-09 NOTE — Progress Notes (Signed)
o2 sensor moved to right ear, pt sating 94% in RA, and maintained above 90. Will continue to monitor.

## 2020-06-09 NOTE — TOC Transition Note (Signed)
Transition of Care (TOC) - CM/SW Discharge Note Donn Pierini RN, BSN Transitions of Care Unit 4E- RN Case Manager See Treatment Team for direct phone #    Patient Details  Name: Holly Moon MRN: [id number] Date of Birth: [birth year]  Transition of Care [facility]) CM/SW Contact:  Darrold Span, RN Phone Number: 06/09/2020, 11:08 AM   Clinical Narrative:    Pt stable for transition home today, INR check had been scheduled with primary care for this afternoon- call made to PCP office to change appointment- however when speaking to scheduler- informed that pt had already called and changed appointment to Friday Oct. 22 at 2:00 pm. - this was changed on the AVS to reflect the new appointment.  Noted Order for home 02- however pt is on RA  At this time and will not need home 02 arranged. Also HHPT ordered but pt would like to do outpt therapy later after she gets home and settled. She will f/u with her PCP regarding this. No HHPT referral made  No further TOC needs noted at this time.   Final next level of care: Home/Self Care Barriers to Discharge: No Barriers Identified   Patient Goals and CMS Choice Patient states their goals for this hospitalization and ongoing recovery are:: return home   Choice offered to / list presented to : NA  Discharge Placement                 Home      Discharge Plan and Services   Discharge Planning Services: CM Consult, Follow-up appt scheduled Post Acute Care Choice: Durable Medical Equipment, Home Health          DME Arranged: Oxygen DME Agency: NA       HH Arranged: PT, Patient Refused HH HH Agency: NA        Social Determinants of Health (SDOH) Interventions     Readmission Risk Interventions Readmission Risk Prevention Plan 06/09/2020  Transportation Screening Complete  PCP or Specialist Appt within 5-7 Days Complete  Home Care Screening Complete  Medication Review (RN CM) Complete  Some recent data might be  hidden

## 2020-06-09 NOTE — Progress Notes (Signed)
Physical Therapy Treatment Patient Details Name: Holly Moon MRN: [id number] DOB: Dec 28, 1970 Today's Date: 06/09/2020    History of Present Illness Pt presented with SOB and found to have LLE DVT and submassive PE. Pt underwent catheter directed pulmonary arterial lysis on 10/12. PMH - morbid obesity, perforated colon, abdominal abscesses, PE, covid, DM, chronic back pain, depression, anxiety.     PT Comments    Pt admitted with above diagnosis. Pt was able to ambulate with RW with min guard assist with good safety. Also ambulated without device with min guard assist in controlled environment as her right wrist hurts and it hurts to use it weight bearing on RW.  Pt going home today and feels comfortable caring for herself.  Did not need O2 today as sats on RA >89% throughout session.  Pt currently with functional limitations due to balance and endurance deficits. Pt will benefit from skilled PT to increase their independence and safety with mobility to allow discharge to the venue listed below.     Follow Up Recommendations  Home health PT     Equipment Recommendations  None recommended by PT    Recommendations for Other Services       Precautions / Restrictions Precautions Precautions: Other (comment) Precaution Comments: watch SpO2 Restrictions Weight Bearing Restrictions: No    Mobility  Bed Mobility               General bed mobility comments: Pt was received sitting up on side of bed  Transfers Overall transfer level: Needs assistance Equipment used: Rolling walker (2 wheeled) Transfers: Sit to/from Stand Sit to Stand: Min guard         General transfer comment: Close guard for safety.   Ambulation/Gait Ambulation/Gait assistance: Supervision Gait Distance (Feet): 100 Feet Assistive device: Rolling walker (2 wheeled);None Gait Pattern/deviations: Step-to pattern;Decreased step length - right;Decreased step length - left;Shuffle;Wide base of  support;Decreased stride length Gait velocity: Decreased Gait velocity interpretation: <1.31 ft/sec, indicative of household ambulator General Gait Details: Pt able to progress ambulation to hallway initiailly using RW but chose to quit using it as she has right wrist pain and it was better to no use RW.  Pt was steady with and without RW in controlled environment. Pt wanted to stop by bathroom and had to have BM. Could not wipe herself due to wrist pain therefore PT assisted her with this.     Stairs             Wheelchair Mobility    Modified Rankin (Stroke Patients Only)       Balance Overall balance assessment: Mild deficits observed, not formally tested                                          Cognition Arousal/Alertness: Awake/alert Behavior During Therapy: WFL for tasks assessed/performed Overall Cognitive Status: Within Functional Limits for tasks assessed                                        Exercises      General Comments General comments (skin integrity, edema, etc.): Pt not on O2 and sats on RA at rest 93% and with activity 89-91%.        Pertinent Vitals/Pain Pain Assessment: No/denies pain    Home Living  Prior Function            PT Goals (current goals can now be found in the care plan section) Acute Rehab PT Goals Patient Stated Goal: Home soon Progress towards PT goals: Progressing toward goals    Frequency    Min 3X/week      PT Plan Current plan remains appropriate    Co-evaluation              AM-PAC PT "6 Clicks" Mobility   Outcome Measure  Help needed turning from your back to your side while in a flat bed without using bedrails?: None Help needed moving from lying on your back to sitting on the side of a flat bed without using bedrails?: None Help needed moving to and from a bed to a chair (including a wheelchair)?: A Little Help needed standing up from  a chair using your arms (e.g., wheelchair or bedside chair)?: A Little Help needed to walk in hospital room?: A Little Help needed climbing 3-5 steps with a railing? : A Little 6 Click Score: 20    End of Session Equipment Utilized During Treatment: Gait belt Activity Tolerance: Patient limited by fatigue Patient left: with call bell/phone within reach;in bed (sitting on EOB) Nurse Communication: Mobility status PT Visit Diagnosis: Other abnormalities of gait and mobility (R26.89);Muscle weakness (generalized) (M62.81)     Time: 2725-3664 PT Time Calculation (min) (ACUTE ONLY): 14 min  Charges:  $Gait Training: 8-22 mins                     [name],PT Acute Rehabilitation Services Pager:  [phone]  Office:  [phone]     [name] 06/09/2020, 12:44 PM

## 2020-06-09 NOTE — Progress Notes (Signed)
Pt o2 sat dropped to 87% while sleeping. Was fluctuating between 87-89% in RA. 2l o2 applied via [place], now o2 sat 90-92%. Will continue to monitor.

## 2020-06-09 NOTE — Discharge Summary (Signed)
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 is morbidly obese. Image acquisition challenging due to respiratory motion. IMPRESSIONS  1. The RV is markedly dilated and has severely reduced function . Consider pulmonary embolus.  2. Left ventricular ejection fraction, by estimation, is >75%. The left ventricle has hyperdynamic function. The left ventricle has no regional wall motion abnormalities. Left ventricular diastolic parameters are consistent with Grade I diastolic dysfunction (impaired relaxation).  3. Right ventricular systolic function is severely reduced. The right ventricular size is moderately enlarged.  4. The mitral valve is normal in structure. No evidence of mitral valve regurgitation.  5. The aortic valve is normal in structure. Aortic valve regurgitation is not visualized. No aortic stenosis is present. FINDINGS  Left Ventricle: Left ventricular ejection fraction, by estimation, is >75%. The left ventricle has hyperdynamic function. The left ventricle has no regional wall motion  abnormalities. Definity  contrast agent was given IV to delineate the left ventricular endocardial borders. The left ventricular internal cavity size was small. There is no left ventricular hypertrophy. Left ventricular diastolic parameters are consistent with Grade I diastolic dysfunction (impaired relaxation). Right Ventricle: The right ventricular size is moderately enlarged. Right vetricular wall thickness was not well visualized. Right ventricular systolic function is severely reduced. Left Atrium: Left atrial size was normal in size. Right Atrium: Right atrial size was not well visualized. Pericardium: There is no evidence of pericardial effusion. Mitral Valve: The mitral valve is normal in structure. No evidence of mitral valve regurgitation. Tricuspid Valve: The tricuspid valve is grossly normal. Tricuspid valve regurgitation is trivial. Aortic Valve: The aortic valve is normal in structure. Aortic valve regurgitation is not visualized. No aortic stenosis is present. Pulmonic Valve: The pulmonic valve was normal in structure. Pulmonic valve regurgitation is not visualized. Aorta: The aortic root and ascending aorta are structurally normal, with no evidence of dilitation. IAS/Shunts: The atrial septum is grossly normal. Additional Comments: The RV is markedly dilated and has severely reduced function . Consider pulmonary embolus.  LEFT VENTRICLE PLAX 2D LVIDd:         3.60 cm  Diastology LVIDs:         2.40 cm  LV e' medial:   9.90 cm/s LV PW:         1.20 cm  LV E/e' medial: 0.9 LV IVS:        1.10 cm LVOT diam:     2.00 cm LV SV:         47 LV SV Index:   18 LVOT Area:     3.14 cm  RIGHT VENTRICLE             IVC RV S prime:     12.20 cm/s  IVC diam: 2.50 cm TAPSE (M-mode): 1.5 cm LEFT ATRIUM           Index LA diam:      3.70 cm 1.45 cm/m LA Vol (A4C): 48.0 ml 18.87 ml/m  AORTIC VALVE LVOT Vmax:   82.20 cm/s LVOT Vmean:  58.100 cm/s LVOT VTI:    0.149 m  AORTA Ao Root diam: 3.10 cm MV E velocity: 9.14  cm/s                           SHUNTS                           Systemic VTI:  0.15 m                           Systemic Diam: 2.00 cm Kristeen Miss MD Electronically signed by Kristeen Miss MD Signature Date/Time: 05/30/2020/1:49:49 PM    Final    VAS [place] LOWER EXTREMITY VENOUS (DVT) (ONLY [facility] & [facility] 7a-7p)  Result Date: 05/30/2020  Lower Venous DVTStudy Indications: Swelling.  Risk Factors: History of PE. Limitations: Body habitus and poor ultrasound/tissue interface. Comparison Study: No prior studies available. Performing Technologist: Jean Rosenthal  Examination Guidelines: A complete evaluation includes B-mode imaging, spectral Doppler, color Doppler, and power Doppler as needed of all accessible portions of each vessel. Bilateral testing is considered an integral part of a complete examination. Limited examinations for reoccurring indications may be performed as noted. The reflux portion of the exam is performed with the patient in reverse Trendelenburg.  +-----+---------------+---------+-----------+----------+--------------+  RIGHTCompressibilityPhasicitySpontaneityPropertiesThrombus Aging +-----+---------------+---------+-----------+----------+--------------+ CFV  Full           Yes      Yes                                 +-----+---------------+---------+-----------+----------+--------------+   +---------+---------------+---------+-----------+----------+--------------+ LEFT     CompressibilityPhasicitySpontaneityPropertiesThrombus Aging +---------+---------------+---------+-----------+----------+--------------+ CFV      Full           Yes      Yes                                 +---------+---------------+---------+-----------+----------+--------------+ SFJ      Full                                                        +---------+---------------+---------+-----------+----------+--------------+ FV Prox  Full                                                         +---------+---------------+---------+-----------+----------+--------------+ FV Mid   Full                                                        +---------+---------------+---------+-----------+----------+--------------+ FV Distal                                             Not visualized +---------+---------------+---------+-----------+----------+--------------+ PFV      Full                                                        +---------+---------------+---------+-----------+----------+--------------+ POP      None           No       No                                  +---------+---------------+---------+-----------+----------+--------------+ PTV      None           No       No                                  +---------+---------------+---------+-----------+----------+--------------+ PERO     None           No       No                                  +---------+---------------+---------+-----------+----------+--------------+ Gastroc  None  No       No                                  +---------+---------------+---------+-----------+----------+--------------+     Summary: RIGHT: - No evidence of common femoral vein obstruction.  LEFT: - Findings consistent with acute deep vein thrombosis involving the left popliteal vein, left posterior tibial veins, left peroneal veins, and left gastrocnemius veins. - No cystic structure found in the popliteal fossa.  *See table(s) above for measurements and observations. Electronically signed by Lemar Livings MD on 05/30/2020 at 5:35:23 PM.    Final    IR INFUSION THROMBOL ARTERIAL INITIAL (MS)  Result Date: 06/03/2020 INDICATION: Bilateral submassive pulmonary emboli.  See consultation. EXAM: 1. ULTRASOUND GUIDANCE FOR VENOUS ACCESS X2 2. FLUOROSCOPIC GUIDED PLACEMENT OF BILATERAL PULMONARY ARTERIAL LYTIC INFUSION CATHETERS COMPARISON:  Chest CTA-05/30/2020 MEDICATIONS: Versed 3 mg IV Sedation time: 60 minutes  CONTRAST:  None required COMPLICATIONS: None immediate TECHNIQUE: Informed written consent was obtained from the patient after a discussion of the risks, benefits and alternatives to treatment. Questions regarding the procedure were encouraged and answered. A timeout was performed prior to the initiation of the procedure. Ultrasound scanning was performed of the right IJ vein, patency confirmed and documented. As such, the right internal jugular vein was selected for vascular access. The right neck was prepped and draped in the usual sterile fashion, and a sterile drape was applied covering the operative field. Maximum barrier sterile technique with sterile gowns and gloves were used for the procedure. A timeout was performed prior to the initiation of the procedure. Local anesthesia was provided with 1% lidocaine. Under direct ultrasound guidance, the right internal jugular vein was accessed with a micro puncture sheath ultimately allowing placement of a 6 French vascular sheath. Slightly cranial to this initial access, the right internal jugular vein was again accessed with a micropuncture sheath ultimately allowing placement of a 6 French vascular sheath. With the use of a Bentson wire, an angled pigtail catheter was advanced into the right main pulmonary artery. Pressure measurements were then obtained from the proximal right pulmonary artery. Over an exchange length wire, the pigtail catheter was exchanged for a 90/20 cm multi side-hole infusion catheter. With the use of a Bentson wire, a pigtail catheter was advanced into the left main pulmonary artery. Over an exchange length wire, the pigtail catheter was exchanged for a 90/10 cm multi side-hole assisted infusion catheter. A postprocedural fluoroscopic image was obtained of the check demonstrating final catheter positioning. Both vascular sheath were secured at the right neck with 0 silk suture. The external catheter tubing was secured at the right chest and  the lytic therapy was initiated. The patient tolerated the procedure well without immediate postprocedural complication. FINDINGS: Acquired pressure measurements: Right main pulmonary artery-40/37 (38) mmHg (normal: < 25/10) Following the procedure, both infusion catheter tips terminate within the distal aspects of the bilateral lower lobe sub segmental pulmonary arteries. IMPRESSION: 1. Successful fluoroscopic guided initiation of bilateral catheter directed pulmonary arterial lysis for sub massive pulmonary embolism and right-sided heart strain. 2. Elevated pressure measurements within the right main pulmonary artery compatible with pulmonary arterial hypertension. PLAN: -infuse x 10 hours in ICU -recheck pulmonary arterial pressures and probable catheter removal Electronically Signed   By: Corlis Leak M.D.   On: 06/01/2020 15:22   IR INFUSION THROMBOL ARTERIAL INITIAL (MS)  Result Date: 06/01/2020 INDICATION: Bilateral submassive pulmonary emboli.  See consultation. EXAM: 1. ULTRASOUND GUIDANCE FOR VENOUS ACCESS X2 2. FLUOROSCOPIC GUIDED PLACEMENT OF BILATERAL PULMONARY ARTERIAL LYTIC INFUSION CATHETERS COMPARISON:  Chest CTA-05/30/2020 MEDICATIONS: Versed 3 mg IV Sedation time: 60 minutes CONTRAST:  None required COMPLICATIONS: None immediate TECHNIQUE: Informed written consent was obtained from the patient after a discussion of the risks, benefits and alternatives to treatment. Questions regarding the procedure were encouraged and answered. A timeout was performed prior to the initiation of the procedure. Ultrasound scanning was performed of the right IJ vein, patency confirmed and documented. As such, the right internal jugular vein was selected for vascular access. The right neck was prepped and draped in the usual sterile fashion, and a sterile drape was applied covering the operative field. Maximum barrier sterile technique with sterile gowns and gloves were used for the procedure. A timeout was  performed prior to the initiation of the procedure. Local anesthesia was provided with 1% lidocaine. Under direct ultrasound guidance, the right internal jugular vein was accessed with a micro puncture sheath ultimately allowing placement of a 6 French vascular sheath. Slightly cranial to this initial access, the right internal jugular vein was again accessed with a micropuncture sheath ultimately allowing placement of a 6 French vascular sheath. With the use of a Bentson wire, an angled pigtail catheter was advanced into the right main pulmonary artery. Pressure measurements were then obtained from the proximal right pulmonary artery. Over an exchange length wire, the pigtail catheter was exchanged for a 90/20 cm multi side-hole infusion catheter. With the use of a Bentson wire, a pigtail catheter was advanced into the left main pulmonary artery. Over an exchange length wire, the pigtail catheter was exchanged for a 90/10 cm multi side-hole assisted infusion catheter. A postprocedural fluoroscopic image was obtained of the check demonstrating final catheter positioning. Both vascular sheath were secured at the right neck with 0 silk suture. The external catheter tubing was secured at the right chest and the lytic therapy was initiated. The patient tolerated the procedure well without immediate postprocedural complication. FINDINGS: Acquired pressure measurements: Right main pulmonary artery-40/37 (38) mmHg (normal: < 25/10) Following the procedure, both infusion catheter tips terminate within the distal aspects of the bilateral lower lobe sub segmental pulmonary arteries. IMPRESSION: 1. Successful fluoroscopic guided initiation of bilateral catheter directed pulmonary arterial lysis for sub massive pulmonary embolism and right-sided heart strain. 2. Elevated pressure measurements within the right main pulmonary artery compatible with pulmonary arterial hypertension. PLAN: -infuse x 10 hours in ICU -recheck  pulmonary arterial pressures and probable catheter removal Electronically Signed   By: Corlis Leak M.D.   On: 06/01/2020 15:22   IR THROMB F/U EVAL ART/VEN FINAL DAY (MS)  Result Date: 06/02/2020 INDICATION: Bilateral submassive pulmonary embolism and status post 12 hour transcatheter tPA infusion in both pulmonary arteries. EXAM: FOLLOW-UP OF PULMONARY THROMBOLYTIC INFUSION THERAPY, FINAL DAY COMPARISON:  06/02/2020 MEDICATIONS: None. ANESTHESIA/SEDATION: None FLUOROSCOPY TIME:  None COMPLICATIONS: None immediate. TECHNIQUE: After completion of 12 hours of tPA infusion via infusion catheters in bilateral pulmonary arteries, pressure measurements were obtained through infusion catheters at the bedside. Bilateral effusion catheters and sheaths were then removed and hemostasis obtained with manual compression. FINDINGS: Measured right pulmonary artery pressure after completion of thrombolytic therapy is 31/23 (28) mm Hg compared to 40/37 (38) mm Hg prior to treatment. IMPRESSION: Decrease in pulmonary artery pressures after thrombolytic infusion therapy to treat submassive pulmonary embolism. There also was some clinical improvement in symptoms. Bilateral infusion catheters and sheaths were removed at  the bedside. Electronically Signed   By: Irish Lack M.D.   On: 06/02/2020 16:57    Microbiology: Recent Results (from the past 240 hour(s))  Culture, Urine     Status: Abnormal   Collection Time: 06/04/20  9:40 PM   Specimen: Urine, Clean Catch  Result Value Ref Range Status   Specimen Description URINE, RANDOM  Final   Special Requests   Final    NONE Performed at [facility], [address], [place], [place] [address]    Culture MULTIPLE SPECIES PRESENT, SUGGEST RECOLLECTION (A)  Final   Report Status 06/05/2020 FINAL  Final  Culture, Urine     Status: Abnormal   Collection Time: 06/04/20  9:40 PM   Specimen: Urine, Catheterized  Result Value Ref Range Status   Specimen Description  URINE, CATHETERIZED  Final   Special Requests NONE  Final   Culture (A)  Final    <10,000 COLONIES/mL INSIGNIFICANT GROWTH Performed at [facility], [address] [place], [place] [address]    Report Status 06/08/2020 FINAL  Final     Labs: Basic Metabolic Panel: Recent Labs  Lab 06/04/20 0800 06/05/20 0054 06/06/20 0429 06/08/20 0500  NA 137 135 134* 137  K 4.0 4.0 3.9 4.2  CL 102 102 101 102  CO2 GLUCOSE 146* 157* 156* 139*  BUN CREATININE 0.90 0.97 0.86 1.03*  CALCIUM 8.6* 8.8* 8.6* 9.2   Liver Function Tests: Recent Labs  Lab 06/04/20 0800 06/05/20 0054 06/06/20 0429  AST 13* 14* 13*  ALT ALKPHOS 78 76 68  BILITOT 0.6 0.4 0.2*  PROT 6.2* 6.4* 5.9*  ALBUMIN 2.7* 2.8* 2.6*   No results for input(s): LIPASE, AMYLASE in the last 168 hours. No results for input(s): AMMONIA in the last 168 hours. CBC: Recent Labs  Lab 06/03/20 0416 06/04/20 0800 06/05/20 0054 06/06/20 0429 06/07/20 0425  WBC 11.0* 10.6* 11.6* 10.1 10.4  HGB 13.2 12.7 13.0 11.8* 12.5  HCT 43.7 42.0 41.8 38.5 39.9  MCV 90.3 89.7 89.1 88.3 88.5  PLT 264 285 282 285 325   Cardiac Enzymes: No results for input(s): CKTOTAL, CKMB, CKMBINDEX, TROPONINI in the last 168 hours. BNP: BNP (last 3 results) Recent Labs    05/29/20 2358  BNP 210.1*    ProBNP (last 3 results) No results for input(s): PROBNP in the last 8760 hours.  CBG: Recent Labs  Lab 06/08/20 0553 06/08/20 1145 06/08/20 1654 06/08/20 2224 06/09/20 0623  GLUCAP 141* 145* 128* 108* 125*    Principal Problem:   Acute DVT (deep venous thrombosis) (HCC) Active Problems:   History of anxiety   History of pulmonary embolism   MDD (major depressive disorder)   Hyperglycemia   Chronic back pain   Embolism (HCC)   Time coordinating discharge: 38 minutes  Signed:        Herbie Lehrmann, DO [facility]  06/09/2020, 4:15 PM

## 2020-06-23 ENCOUNTER — Emergency Department (HOSPITAL_COMMUNITY): Payer: BC Managed Care – PPO

## 2020-06-23 ENCOUNTER — Encounter (HOSPITAL_COMMUNITY): Payer: Self-pay | Admitting: *Deleted

## 2020-06-23 ENCOUNTER — Emergency Department (HOSPITAL_COMMUNITY)
Admission: EM | Admit: 2020-06-23 | Discharge: 2020-06-23 | Disposition: A | Discharge status: Home / Self Care | Payer: BC Managed Care – PPO | Attending: Emergency Medicine | Admitting: Emergency Medicine

## 2020-06-23 ENCOUNTER — Emergency Department (HOSPITAL_BASED_OUTPATIENT_CLINIC_OR_DEPARTMENT_OTHER): Payer: BC Managed Care – PPO

## 2020-06-23 ENCOUNTER — Other Ambulatory Visit: Payer: Self-pay

## 2020-06-23 DIAGNOSIS — R079 Chest pain, unspecified: Secondary | ICD-10-CM | POA: Diagnosis present

## 2020-06-23 DIAGNOSIS — R519 Headache, unspecified: Secondary | ICD-10-CM | POA: Insufficient documentation

## 2020-06-23 DIAGNOSIS — R002 Palpitations: Secondary | ICD-10-CM | POA: Insufficient documentation

## 2020-06-23 DIAGNOSIS — Z79899 Other long term (current) drug therapy: Secondary | ICD-10-CM | POA: Diagnosis not present

## 2020-06-23 DIAGNOSIS — I10 Essential (primary) hypertension: Secondary | ICD-10-CM | POA: Diagnosis not present

## 2020-06-23 DIAGNOSIS — Z7901 Long term (current) use of anticoagulants: Secondary | ICD-10-CM | POA: Insufficient documentation

## 2020-06-23 DIAGNOSIS — Z8616 Personal history of COVID-19: Secondary | ICD-10-CM | POA: Diagnosis not present

## 2020-06-23 DIAGNOSIS — R110 Nausea: Secondary | ICD-10-CM | POA: Insufficient documentation

## 2020-06-23 DIAGNOSIS — Z86718 Personal history of other venous thrombosis and embolism: Secondary | ICD-10-CM

## 2020-06-23 DIAGNOSIS — R52 Pain, unspecified: Secondary | ICD-10-CM

## 2020-06-23 DIAGNOSIS — F17200 Nicotine dependence, unspecified, uncomplicated: Secondary | ICD-10-CM | POA: Diagnosis not present

## 2020-06-23 LAB — CBC
HCT: 49.1 % — ABNORMAL HIGH (ref 36.0–46.0)
Hemoglobin: 15.1 g/dL — ABNORMAL HIGH (ref 12.0–15.0)
MCH: 27.1 pg (ref 26.0–34.0)
MCHC: 30.8 g/dL (ref 30.0–36.0)
MCV: 88 fL (ref 80.0–100.0)
Platelets: 499 10*3/uL — ABNORMAL HIGH (ref 150–400)
RBC: 5.58 MIL/uL — ABNORMAL HIGH (ref 3.87–5.11)
RDW: 15.4 % (ref 11.5–15.5)
WBC: 9.1 10*3/uL (ref 4.0–10.5)
nRBC: 0 % (ref 0.0–0.2)

## 2020-06-23 LAB — TROPONIN I (HIGH SENSITIVITY)
Troponin I (High Sensitivity): 6 ng/L (ref <18)
Troponin I (High Sensitivity): 7 ng/L (ref <18)

## 2020-06-23 LAB — I-STAT BETA HCG BLOOD, ED (MC, WL, AP ONLY): I-stat hCG, quantitative: <5 m[IU]/mL (ref <5)

## 2020-06-23 LAB — BASIC METABOLIC PANEL
Anion gap: 10 (ref 5–15)
BUN: 14 mg/dL (ref 6–20)
CO2: 24 mmol/L (ref 22–32)
Calcium: 9.4 mg/dL (ref 8.9–10.3)
Chloride: 102 mmol/L (ref 98–111)
Creatinine, Ser: 0.78 mg/dL (ref 0.44–1.00)
GFR, Estimated: >60 mL/min (ref >60)
Glucose, Bld: 155 mg/dL — ABNORMAL HIGH (ref 70–99)
Potassium: 4.3 mmol/L (ref 3.5–5.1)
Sodium: 136 mmol/L (ref 135–145)

## 2020-06-23 MED ORDER — ACETAMINOPHEN 325 MG PO TABS
650.0000 mg | ORAL_TABLET | Freq: Once | ORAL | Status: DC
  Filled 2020-06-23: qty 2

## 2020-06-23 MED ORDER — IOHEXOL 350 MG/ML SOLN
75.0000 mL | Freq: Once | INTRAVENOUS | Status: AC | PRN
  Administered 2020-06-23: 75 mL via INTRAVENOUS

## 2020-06-23 NOTE — Discharge Instructions (Signed)
 ?  [name]:  Thank you for allowing [place] to take care of you today.  We hope you begin feeling better soon.  To-Do: Please follow-up with your primary doctor or call  to schedule an appointment with a new primary care doctor Please return to the Emergency Department or call 911 if you experience chest pain, shortness of breath, severe pain, severe fever, altered mental status, or have any reason to think that you need emergency medical care.  Thank you again.  Hope you feel better soon.

## 2020-06-23 NOTE — Progress Notes (Signed)
Left Lower Ext. study completed.   See CVProc for preliminary results.   Tyberius Ryner, RDMS, RVT 

## 2020-06-23 NOTE — ED Triage Notes (Signed)
The pt was brought in from [facility] c/o chest pain for 2 houors hs if dvrs  Kt keg one week ago  02 sats good lt leg more swollen  Coumadin started ibe week agi

## 2020-06-23 NOTE — ED Provider Notes (Signed)
[facility] EMERGENCY DEPARTMENT Provider Note   CSN: [id number] Arrival date & time: 06/23/20  0456     History Chief Complaint  Patient presents with  . Chest Pain    Holly Moon is a 49 y.o. female.  The history is provided by the patient.  Chest Pain Pain location:  L chest Pain quality: pressure   Pain radiates to:  Neck, L jaw and L arm Pain severity:  Moderate Onset quality:  Sudden Duration:  7 hours Timing:  Constant Progression:  Improving Chronicity:  New Context: at rest   Relieved by:  Nothing Associated symptoms: headache, nausea and palpitations   Associated symptoms: no abdominal pain, no back pain, no cough, no dysphagia, no fever and no vomiting   Risk factors: obesity and prior DVT/PE        Past Medical History:  Diagnosis Date  . COVID-19   . Hypertension 2015  . IBS (irritable bowel syndrome)   . Pulmonary embolism [facility])     Patient Active Problem List   Diagnosis Date Noted  . Embolism ([place])   . Chronic back pain 05/30/2020  . Acute DVT (deep venous thrombosis) ([place]) 05/29/2020  . Hyperglycemia 05/29/2020  . Cellulitis 08/04/2019  . Hypoxia   . Intra-abdominal abscess ([place])   . Intraabdominal fluid collection   . Perforation of sigmoid colon due to diverticulitis 07/21/2019  . Sinus bradycardia 07/21/2019  . Severe sepsis ( [place]) 07/21/2019  . Diverticulitis 07/08/2019  . MDD (major depressive disorder) 09/08/2018  . HLA B27 (HLA B27 positive) 02/28/2018  . History of carpal tunnel release 02/17/2018  . History of anxiety 02/17/2018  . History of hypertension 02/17/2018  . DDD (degenerative disc disease), lumbar 02/17/2018  . History of pulmonary embolism 02/17/2018  . BMI 50.0-59.9, adult ([place]) 05/04/2017    Past Surgical History:  Procedure Laterality Date  . FRACTURE SURGERY Left 1987   leg fracture  . IR ANGIOGRAM SELECTIVE EACH ADDITIONAL VESSEL  06/02/2020  . IR ANGIOGRAM SELECTIVE EACH  ADDITIONAL VESSEL  06/02/2020  . IR INFUSION THROMBOL ARTERIAL INITIAL (MS)  06/01/2020  . IR INFUSION THROMBOL ARTERIAL INITIAL (MS)  06/02/2020  . IR SINUS/FIST TUBE CHK-NON GI  07/26/2019  . IR THROMB F/U EVAL ART/VEN FINAL DAY (MS)  06/02/2020  . IR US GUIDE VASC ACCESS RIGHT  06/01/2020  . RIGHT CARPAL    . WRIST FRACTURE SURGERY       OB History   No obstetric history on file.     Family History  Problem Relation Age of Onset  . Kidney failure Father     Social History   Tobacco Use  . Smoking status: Current Every Day Smoker    Packs/day: 0.05  . Smokeless tobacco: Never Used  Vaping Use  . Vaping Use: Never used  Substance Use Topics  . Alcohol use: Yes    Comment: very rarely   . Drug use: No    Home Medications Prior to Admission medications   Medication Sig Start Date End Date Taking? Authorizing Provider  ALPRAZolam Duanne Moron) 0.5 MG tablet Take 0.5 mg by mouth 3 (three) times daily.  06/30/19  Yes [provider]  amLODipine (NORVASC) 10 MG tablet Take 10 mg by mouth daily. 06/20/20  Yes [provider]  ARIPiprazole (ABILIFY) 5 MG tablet Take 5 mg by mouth daily. 02/20/20  Yes [provider]  busPIRone (BUSPAR) 30 MG tablet Take 30 mg by mouth 3 (three) times daily.  06/29/19  Yes [provider]  desvenlafaxine (PRISTIQ) 100 MG 24 hr tablet Take 100 mg by mouth daily.   Yes [provider]  gabapentin (NEURONTIN) 300 MG capsule Take 600-900 mg by mouth See admin instructions. Take 2 capsules every morning and at noon then take 3 capsules at bedtime 01/25/18  Yes [provider]  ondansetron (ZOFRAN) 8 MG tablet Take 8 mg by mouth every 8 (eight) hours as needed for nausea or vomiting.  01/31/20  Yes [provider]  oxyCODONE (OXY IR/ROXICODONE) 5 MG immediate release tablet Take 5 mg by mouth every 4 (four) hours as needed for moderate pain.  06/13/20  Yes [provider]  tiZANidine (ZANAFLEX)  4 MG tablet Take 4 mg by mouth every 8 (eight) hours as needed for muscle spasms.  02/04/18  Yes [provider]  warfarin (COUMADIN) 5 MG tablet Take 5 mg by mouth daily. 06/13/20  Yes [provider]  zonisamide (ZONEGRAN) 100 MG capsule Take 100 mg by mouth daily.  03/12/20 06/23/29 Yes [provider]  amLODipine (NORVASC) 5 MG tablet Take 1 tablet (5 mg total) by mouth daily. Patient not taking: Reported on 06/23/2020 06/09/20   Swayze, Ava, DO  metFORMIN (GLUCOPHAGE) 500 MG tablet Take 1 tablet (500 mg total) by mouth 2 (two) times daily with a meal. Patient not taking: Reported on 06/23/2020 06/08/20   Swayze, Ava, DO  warfarin (COUMADIN) 7.5 MG tablet Take 1 tablet (7.5 mg total) by mouth daily at 4 PM. Patient not taking: Reported on 06/23/2020 06/08/20   Swayze, Ava, DO    Allergies    Covid-19 mrna vacc (moderna) and Tape  Review of Systems   Review of Systems  Constitutional: Negative for chills and fever.  HENT: Negative for congestion, sore throat and trouble swallowing.   Respiratory: Negative for cough.   Cardiovascular: Positive for chest pain and palpitations.  Gastrointestinal: Positive for nausea. Negative for abdominal pain and vomiting.  Genitourinary: Negative for difficulty urinating.  Musculoskeletal: Positive for neck pain. Negative for back pain.  Neurological: Positive for headaches.  All other systems reviewed and are negative.   Physical Exam Updated Vital Signs BP (!) 143/96 (BP Location: Other (Comment)) Comment (BP Location): left forearm  Pulse 90   Temp 98.4 F (36.9 C) (Oral)   Resp (!) 21   Ht 5' 6"  (1.676 m)   Wt (!) 154.2 kg   SpO2 99%   BMI 54.88 kg/m   Physical Exam Vitals reviewed.  Constitutional:      General: She is not in acute distress.    Appearance: She is well-developed.  HENT:     Head: Normocephalic and atraumatic.     Nose: Nose normal.     Mouth/Throat:     Mouth: Mucous membranes are moist.      Pharynx: Oropharynx is clear.  Eyes:     Conjunctiva/sclera: Conjunctivae normal.  Cardiovascular:     Rate and Rhythm: Normal rate.     Heart sounds: Normal heart sounds.  Pulmonary:     Effort: Pulmonary effort is normal. No respiratory distress.     Breath sounds: Normal breath sounds. No wheezing, rhonchi or rales.  Abdominal:     General: Abdomen is flat.     Palpations: Abdomen is soft.     Tenderness: There is no abdominal tenderness.  Musculoskeletal:        General: Tenderness present.     Cervical back: Neck supple.     Left lower  leg: No edema.     Comments: L leg tenderness, posteriorly in calf and thigh  Skin:    General: Skin is warm and dry.  Neurological:     Mental Status: She is alert.  Psychiatric:        Mood and Affect: Mood normal.        Behavior: Behavior normal.     ED Results / Procedures / Treatments   Labs (all labs ordered are listed, but only abnormal results are displayed) Labs Reviewed  BASIC METABOLIC PANEL - Abnormal; Notable for the following components:      Result Value   Glucose, Bld 155 (*)    All other components within normal limits  CBC - Abnormal; Notable for the following components:   RBC 5.58 (*)    Hemoglobin 15.1 (*)    HCT 49.1 (*)    Platelets 499 (*)    All other components within normal limits  I-STAT BETA HCG BLOOD, ED ([facility], [facility], [facility] ONLY)  TROPONIN I (HIGH SENSITIVITY)  TROPONIN I (HIGH SENSITIVITY)    EKG None  Radiology DG Chest 2 View  Result Date: 06/23/2020 CLINICAL DATA:  Chest pain.  History of DVT and pulmonary embolism EXAM: CHEST - 2 VIEW COMPARISON:  06/01/2020 FINDINGS: Clearing of the opacity over the right upper lobe. Mild scarring or atelectasis at the lingula. No edema, effusion, or pneumothorax. Normal heart size and mediastinal contours. IMPRESSION: No evidence of active disease. Interval clearing of right apical opacity. Electronically Signed   By: Monte Fantasia M.D.   On: 06/23/2020 05:49    CT Angio Chest PE W and/or Wo Contrast  Result Date: 06/23/2020 CLINICAL DATA:  Chest pain for 2 hours. Pulmonary embolus on CT scan 05/30/2020. EXAM: CT ANGIOGRAPHY CHEST WITH CONTRAST TECHNIQUE: Multidetector CT imaging of the chest was performed using the standard protocol during bolus administration of intravenous contrast. Multiplanar CT image reconstructions and MIPs were obtained to evaluate the vascular anatomy. CONTRAST:  29m OMNIPAQUE IOHEXOL 350 MG/ML SOLN COMPARISON:  CTA chest 05/30/2020 FINDINGS: Cardiovascular: Heart size upper normal. No substantial pericardial effusion. No thoracic aortic aneurysm. Saddle embolus seen on previous study has resolved in the interval. There is residual but decreased thrombus in subsegmental right upper and segmental/subsegmental right lower lobe pulmonary arteries. The left lower lobe pulmonary embolus seen previously has decreased substantially in the interval with areas of subsegmental involvement in the left lower lobe identified today. Mediastinum/Nodes: No mediastinal lymphadenopathy. There is no hilar lymphadenopathy. Tiny hiatal hernia. The esophagus has normal imaging features. There is no axillary lymphadenopathy. Lungs/Pleura: No focal airspace consolidation. No evidence for pulmonary infarct. Subsegmental atelectasis noted in the right lower lobe with bandlike opacity in the right apex potentially infectious/inflammatory. Upper Abdomen: The liver shows diffusely decreased attenuation suggesting fat deposition. Musculoskeletal: No worrisome lytic or sclerotic osseous abnormality. Review of the MIP images confirms the above findings. IMPRESSION: 1. Interval resolution of the saddle embolus with residual but decreased thrombus in in the lungs bilaterally. No evidence for new acute pulmonary embolus on the current exam. 2. Bandlike opacity in the right apex may be infectious/inflammatory. Follow-up CT in 3 months could be used to ensure resolution. 3.  Hepatic steatosis. Electronically Signed   By: EMisty StanleyM.D.   On: 06/23/2020 09:47   VAS [place]OWER EXTREMITY VENOUS (DVT) (ONLY [facility] & [facility])  Result Date: 06/23/2020  Lower Venous DVTStudy Indications: Pain, and Previous DVT left leg 05-30-20.  Risk Factors: Obesity. Anticoagulation: Coumadin. Comparison  Study: 05-30-2020 Performing Technologist: Griffin Basil RCT RDMS  Examination Guidelines: A complete evaluation includes B-mode imaging, spectral Doppler, color Doppler, and power Doppler as needed of all accessible portions of each vessel. Bilateral testing is considered an integral part of a complete examination. Limited examinations for reoccurring indications may be performed as noted. The reflux portion of the exam is performed with the patient in reverse Trendelenburg.  +---------+---------------+---------+-----------+----------+--------------+ LEFT     CompressibilityPhasicitySpontaneityPropertiesThrombus Aging +---------+---------------+---------+-----------+----------+--------------+ CFV      Full           Yes      Yes                                 +---------+---------------+---------+-----------+----------+--------------+ SFJ      Full                                                        +---------+---------------+---------+-----------+----------+--------------+ FV Prox  Full                                                        +---------+---------------+---------+-----------+----------+--------------+ FV Mid   Full                                                        +---------+---------------+---------+-----------+----------+--------------+ FV DistalFull                                                        +---------+---------------+---------+-----------+----------+--------------+ PFV      Full                                                        +---------+---------------+---------+-----------+----------+--------------+ POP       Partial        Yes      Yes                                 +---------+---------------+---------+-----------+----------+--------------+ PTV      None                                                        +---------+---------------+---------+-----------+----------+--------------+ PERO     Partial                                                     +---------+---------------+---------+-----------+----------+--------------+  Summary: RIGHT: - No evidence of common femoral vein obstruction.  LEFT: - Findings consistent with acute deep vein thrombosis involving the left popliteal vein, left posterior tibial veins, and left peroneal veins. - No cystic structure found in the popliteal fossa. - Left Leg Dvt slightly resolved from Previous exam 05-30-20  *See table(s) above for measurements and observations.    Preliminary     Procedures Procedures (including critical care time)  Medications Ordered in ED Medications  acetaminophen (TYLENOL) tablet 650 mg (has no administration in time range)  iohexol (OMNIPAQUE) 350 MG/ML injection 75 mL (75 mLs Intravenous Contrast Given 06/23/20 0848)    ED Course  I have reviewed the triage vital signs and the nursing notes.  Pertinent labs & imaging results that were available during my care of the patient were reviewed by me and considered in my medical decision making (see chart for details).    MDM Rules/Calculators/A&P                           Medical Decision Making: Holly Moon is a 49 y.o. female who presented to the ED today with chest pain (pressure radiating to L neck and arm) constant, onset overnight. Reports 2 days worsening L leg pain.  Recent hx DVT and large PE (Bilateral submassive pulmonary embolism) s/p transcatheter tPA infusion in both pulmonary arteries (06/01/20).  Pt on coumadin for anticoagulation.    Past medical history significant for HTN, PE, IBS, asthma, DM Reviewed and confirmed nursing  documentation for past medical history, family history, social history.  On my initial exam, the pt was in NAD, normal WOB.   The ECG reveals no anatomical ischemia representing STEMI, New-Onset Arrhythmia, or ischemic equivalent. To further evaluate for ongoing myocardial ischemia, serial troponins will be ordered. The first of these is  not elevated.  Repeat troponin at 3-hours was also not elevated. Therefore, I do not suspect ACS at this time.   The patient's presentation, the patient being hemodynamically stable, and the ECG are not consistent with Pericardial Tamponade. The patient's pain is not positional. This in conjunction with the lack of PR depressions and ST elevations on the ECG are reassuring against Pericarditis. The patient's non-elevated troponin and ECG are also inconsistent with Myocarditis.  The CXR is unremarkable for focal airspace disease.  The patient is afebrile and denies productive cough.  Therefore, I do not suspect Pneumonia. There is no evidence of Pneumothorax on physical exam or on the CXR. CXR shows no evidence of Esophageal Tear and there is no recent intractable emesis or esophageal instrumentation. There is no peritonitis or free air on CXR worrisome for a Perforated Abdominal Viscous.  CTA obtained given recent hx of PE, shows improving decreased thrombus bilaterally, resolution of saddle emboli, and no new PE.  Duplex ultrasound Left leg shows DVT which is slightly resolved from Previous exam 05-30-20. Pt advised to continue on coumadin and follow up closely with PCP.   Consults: none performed  All radiology and laboratory studies reviewed independently and with my attending physician, agree with reading provided by radiologist unless otherwise noted.  Upon reassessing patient, patient was in NAd, resting comfortably.  Based on the above findings, I believe patient is hemodynamically stable for discharge  Patient/and family educated about specific return  precautions for given chief complaint and symptoms.  Patient/and family educated about follow-up with PCP.  Patient/and family expressed understanding of return precautions and need for follow-up.  Patient discharged.   The above care was discussed with and agreed upon by my attending physician.  Emergency Department Medication Summary:  Medications  acetaminophen (TYLENOL) tablet 650 mg (has no administration in time range)  iohexol (OMNIPAQUE) 350 MG/ML injection 75 mL (75 mLs Intravenous Contrast Given 06/23/20 0848)        Final Clinical Impression(s) / ED Diagnoses Final diagnoses:  Chest pain, unspecified type    Rx / DC Orders ED Discharge Orders    None       Roosevelt Locks, MD 06/23/20 1135    Elnora Morrison, MD 06/25/20 1402

## 2020-06-23 NOTE — ED Notes (Signed)
Patient verbalizes understanding of discharge instructions. Opportunity for questioning and answers were provided. Armband removed by staff, pt discharged from ED via wheelchair.  

## 2020-06-23 NOTE — ED Notes (Signed)
Pt transported to CT ?

## 2021-03-25 IMAGING — DX DG CHEST 2V
2 series · 2 of 2 positions shown · non-contrast
Comparison: 06/01/2020

CLINICAL DATA: Chest pain.  History of DVT and pulmonary embolism

EXAM:
CHEST - 2 VIEW

[chest pa]
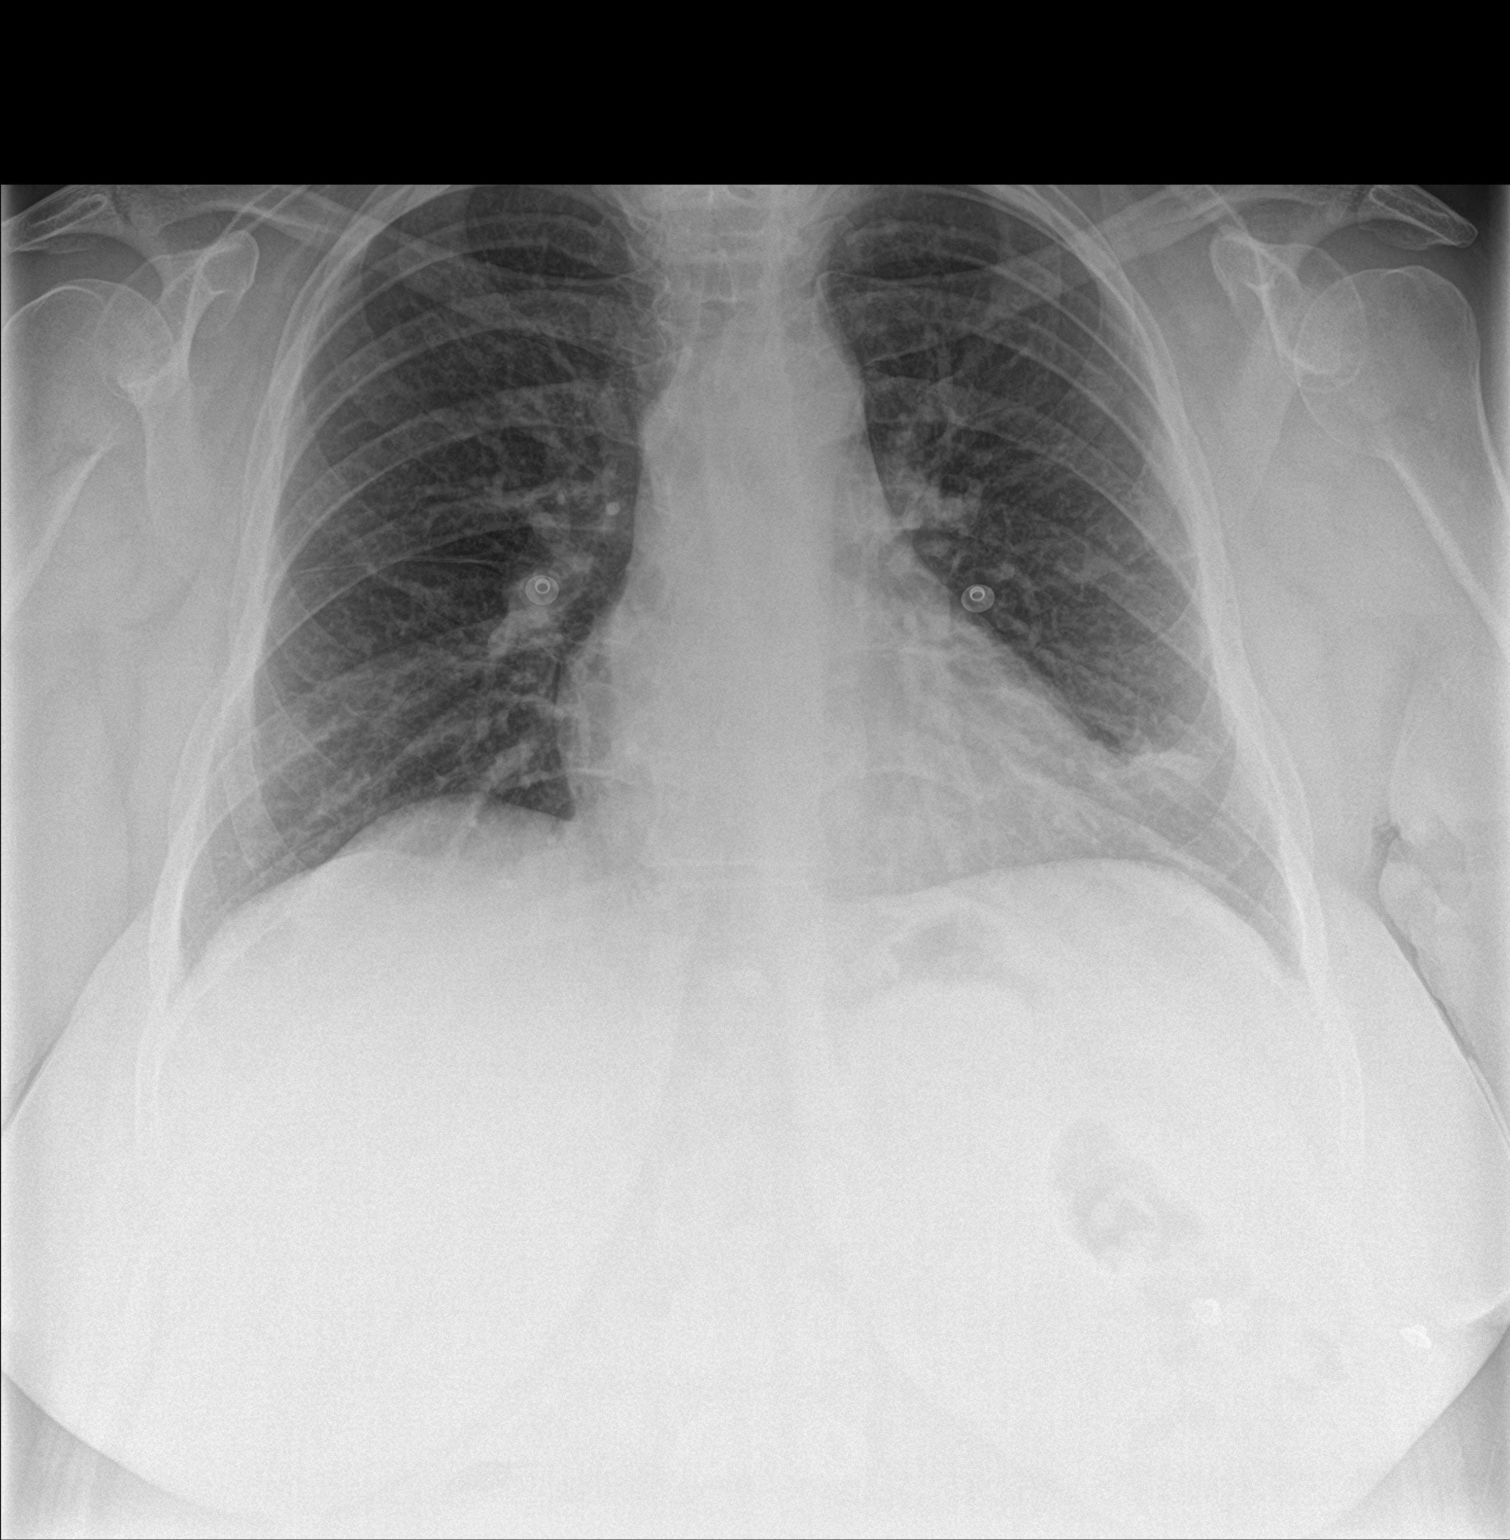

[chest lat]
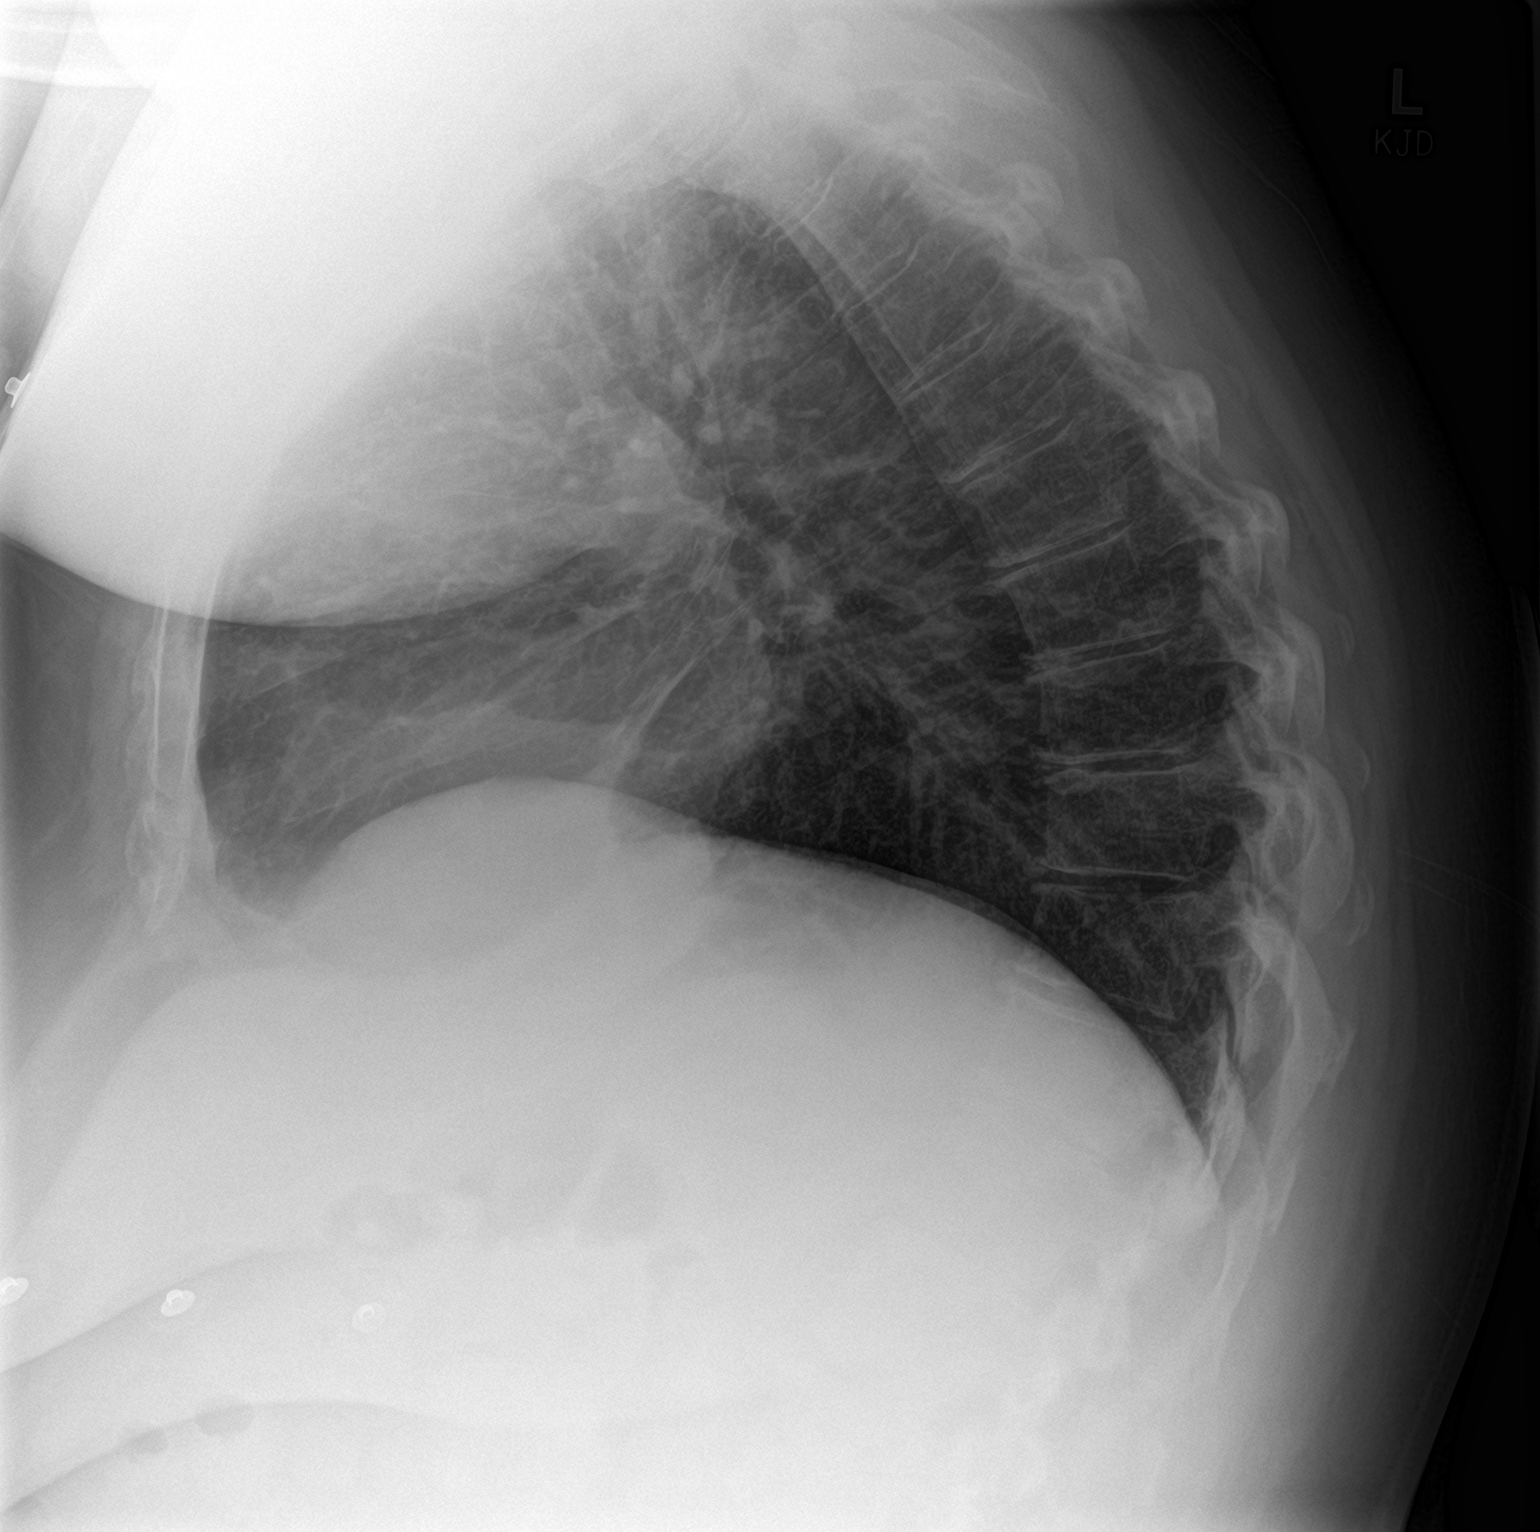

[2 of 2 positions shown; findings below may reference images not displayed]

FINDINGS: Clearing of the opacity over the right upper lobe. Mild scarring or
atelectasis at the lingula. No edema, effusion, or pneumothorax.
Normal heart size and mediastinal contours.
IMPRESSION: No evidence of active disease. Interval clearing of right apical
opacity.

## 2021-05-20 ENCOUNTER — Emergency Department (HOSPITAL_COMMUNITY)
Admission: EM | Admit: 2021-05-20 | Discharge: 2021-05-21 | Disposition: A | Discharge status: Home / Self Care | Payer: HMO | Attending: Emergency Medicine | Admitting: Emergency Medicine

## 2021-05-20 ENCOUNTER — Encounter (HOSPITAL_COMMUNITY): Payer: Self-pay | Admitting: Emergency Medicine

## 2021-05-20 DIAGNOSIS — F17210 Nicotine dependence, cigarettes, uncomplicated: Secondary | ICD-10-CM | POA: Insufficient documentation

## 2021-05-20 DIAGNOSIS — R81 Glycosuria: Secondary | ICD-10-CM | POA: Diagnosis not present

## 2021-05-20 DIAGNOSIS — Z7901 Long term (current) use of anticoagulants: Secondary | ICD-10-CM | POA: Insufficient documentation

## 2021-05-20 DIAGNOSIS — Z8719 Personal history of other diseases of the digestive system: Secondary | ICD-10-CM | POA: Insufficient documentation

## 2021-05-20 DIAGNOSIS — I10 Essential (primary) hypertension: Secondary | ICD-10-CM | POA: Insufficient documentation

## 2021-05-20 DIAGNOSIS — N9489 Other specified conditions associated with female genital organs and menstrual cycle: Secondary | ICD-10-CM | POA: Insufficient documentation

## 2021-05-20 DIAGNOSIS — Z8616 Personal history of COVID-19: Secondary | ICD-10-CM | POA: Insufficient documentation

## 2021-05-20 DIAGNOSIS — R112 Nausea with vomiting, unspecified: Secondary | ICD-10-CM | POA: Insufficient documentation

## 2021-05-20 DIAGNOSIS — R1032 Left lower quadrant pain: Secondary | ICD-10-CM

## 2021-05-20 DIAGNOSIS — Z20822 Contact with and (suspected) exposure to covid-19: Secondary | ICD-10-CM | POA: Diagnosis not present

## 2021-05-20 DIAGNOSIS — Z79899 Other long term (current) drug therapy: Secondary | ICD-10-CM | POA: Diagnosis not present

## 2021-05-20 DIAGNOSIS — R109 Unspecified abdominal pain: Secondary | ICD-10-CM

## 2021-05-20 NOTE — ED Triage Notes (Signed)
Pt c/o LLQ abdominal pain that radiates to groin that began yesterday. Movement worsens pain, rest improves pain. Hx of diverticulitis. Denies fevers, vomiting, cp, shob.

## 2021-05-21 ENCOUNTER — Emergency Department (HOSPITAL_COMMUNITY): Payer: HMO

## 2021-05-21 ENCOUNTER — Encounter (HOSPITAL_COMMUNITY): Payer: Self-pay

## 2021-05-21 LAB — CBC WITH DIFFERENTIAL/PLATELET
Abs Immature Granulocytes: 0.07 10*3/uL (ref 0.00–0.07)
Basophils Absolute: 0.1 10*3/uL (ref 0.0–0.1)
Basophils Relative: 1 %
Eosinophils Absolute: 0.1 10*3/uL (ref 0.0–0.5)
Eosinophils Relative: 1 %
HCT: 51.4 % — ABNORMAL HIGH (ref 36.0–46.0)
Hemoglobin: 15.9 g/dL — ABNORMAL HIGH (ref 12.0–15.0)
Immature Granulocytes: 1 %
Lymphocytes Relative: 25 %
Lymphs Abs: 3 10*3/uL (ref 0.7–4.0)
MCH: 26.9 pg (ref 26.0–34.0)
MCHC: 30.9 g/dL (ref 30.0–36.0)
MCV: 86.8 fL (ref 80.0–100.0)
Monocytes Absolute: 0.5 10*3/uL (ref 0.1–1.0)
Monocytes Relative: 4 %
Neutro Abs: 8.6 10*3/uL — ABNORMAL HIGH (ref 1.7–7.7)
Neutrophils Relative %: 68 %
Platelets: 494 10*3/uL — ABNORMAL HIGH (ref 150–400)
RBC: 5.92 MIL/uL — ABNORMAL HIGH (ref 3.87–5.11)
RDW: 16.5 % — ABNORMAL HIGH (ref 11.5–15.5)
WBC: 12.3 10*3/uL — ABNORMAL HIGH (ref 4.0–10.5)
nRBC: 0 % (ref 0.0–0.2)

## 2021-05-21 LAB — COMPREHENSIVE METABOLIC PANEL
ALT: 21 U/L (ref 0–44)
AST: 24 U/L (ref 15–41)
Albumin: 3.9 g/dL (ref 3.5–5.0)
Alkaline Phosphatase: 92 U/L (ref 38–126)
Anion gap: 13 (ref 5–15)
BUN: 19 mg/dL (ref 6–20)
CO2: 22 mmol/L (ref 22–32)
Calcium: 9 mg/dL (ref 8.9–10.3)
Chloride: 100 mmol/L (ref 98–111)
Creatinine, Ser: 0.8 mg/dL (ref 0.44–1.00)
GFR, Estimated: >60 mL/min (ref >60)
Glucose, Bld: 267 mg/dL — ABNORMAL HIGH (ref 70–99)
Potassium: 4 mmol/L (ref 3.5–5.1)
Sodium: 135 mmol/L (ref 135–145)
Total Bilirubin: 0.4 mg/dL (ref 0.3–1.2)
Total Protein: 8.1 g/dL (ref 6.5–8.1)

## 2021-05-21 LAB — URINALYSIS, ROUTINE W REFLEX MICROSCOPIC
Bilirubin Urine: NEGATIVE
Glucose, UA: 500 mg/dL — AB
Hgb urine dipstick: NEGATIVE
Ketones, ur: NEGATIVE mg/dL
Leukocytes,Ua: NEGATIVE
Nitrite: NEGATIVE
Specific Gravity, Urine: 1.02 (ref 1.005–1.030)
pH: 5.5 (ref 5.0–8.0)

## 2021-05-21 LAB — RESP PANEL BY RT-PCR (FLU A&B, COVID) ARPGX2
Influenza A by PCR: NEGATIVE
Influenza B by PCR: NEGATIVE
SARS Coronavirus 2 by RT PCR: NEGATIVE

## 2021-05-21 LAB — LIPASE, BLOOD: Lipase: 30 U/L (ref 11–51)

## 2021-05-21 LAB — I-STAT BETA HCG BLOOD, ED (MC, WL, AP ONLY): I-stat hCG, quantitative: <5 m[IU]/mL (ref <5)

## 2021-05-21 MED ORDER — SODIUM CHLORIDE 0.9 % IV BOLUS
1000.0000 mL | Freq: Once | INTRAVENOUS | Status: AC
  Administered 2021-05-21: 1000 mL via INTRAVENOUS

## 2021-05-21 MED ORDER — HYDROMORPHONE HCL 1 MG/ML IJ SOLN
1.0000 mg | Freq: Once | INTRAMUSCULAR | Status: AC
  Administered 2021-05-21: 1 mg via INTRAVENOUS
  Filled 2021-05-21: qty 1

## 2021-05-21 MED ORDER — ONDANSETRON HCL 4 MG/2ML IJ SOLN
4.0000 mg | Freq: Once | INTRAMUSCULAR | Status: AC
  Administered 2021-05-21: 4 mg via INTRAVENOUS
  Filled 2021-05-21: qty 2

## 2021-05-21 MED ORDER — IOHEXOL 350 MG/ML SOLN
100.0000 mL | Freq: Once | INTRAVENOUS | Status: AC | PRN
  Administered 2021-05-21: 100 mL via INTRAVENOUS

## 2021-05-21 MED ORDER — SODIUM CHLORIDE (PF) 0.9 % IJ SOLN
INTRAMUSCULAR | Status: AC
  Filled 2021-05-21: qty 50

## 2021-05-21 MED ORDER — HYDROMORPHONE HCL 1 MG/ML IJ SOLN
0.5000 mg | Freq: Once | INTRAMUSCULAR | Status: AC
Start: 2021-05-21 — End: 2021-05-21
  Administered 2021-05-21: 0.5 mg via INTRAVENOUS
  Filled 2021-05-21: qty 1

## 2021-05-21 MED ORDER — ONDANSETRON 4 MG PO TBDP: 4.0000 mg | ORAL_TABLET | Freq: Three times a day (TID) | ORAL | 0 refills | Status: DC | PRN

## 2021-05-21 MED ORDER — OXYCODONE HCL 5 MG PO TABS: 5.0000 mg | ORAL_TABLET | Freq: Four times a day (QID) | ORAL | 0 refills | Status: DC | PRN

## 2021-05-21 MED ORDER — KETOROLAC TROMETHAMINE 30 MG/ML IJ SOLN
30.0000 mg | Freq: Once | INTRAMUSCULAR | Status: DC | PRN
  Filled 2021-05-21: qty 1

## 2021-05-21 NOTE — ED Notes (Signed)
Pt able to tolerate half a cup of water without nausea or vomiting.

## 2021-05-21 NOTE — Discharge Instructions (Addendum)
Thank you for allowing me to care for you today in the Emergency Department.   As we discussed, your work-up did not reveal a specific cause of your pain.  We are going to treat a musculoskeletal cause of pain.  You can take 650 mg of Tylenol once every 6 hours.  Continue to take your home tizanidine as prescribed.  For severe, uncontrollable pain, you can take 1 tablet of oxycodone every 6 hours.  Continue to avoid NSAIDs.  Apply ice pack for 15 to 20 minutes up to 3-4 times a day for worsening symptoms.  Try to stretch as your pain allows. 1 tablet of Zofran dissolve in your tongue every 8 hours as needed for nausea or vomiting.  Follow-up with primary care for reevaluation of your symptoms.  Return to the emergency department if you develop uncontrollable vomiting, if you have black or bloody stool, if you start having a fever, temperature of 100.4 F or higher, if you become unable to walk, have new numbness or weakness, or have other new, concerning symptoms.

## 2021-05-21 NOTE — ED Provider Notes (Signed)
[facility] DEPT Provider Note   CSN: [id number] Arrival date & time: 05/20/21  2348     History Chief Complaint  Patient presents with   Abdominal Pain   Nausea    Holly Moon is a 50 y.o. 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  The patient reports that she had aching pain in her lower abdomen several days ago and thought that she pulled a muscle.  However yesterday, she developed constant, sharp pain that significantly worsened tonight.  The pain radiates into her groin and into her left lower back.  She rates the current pain is 10 out of 10.  Pain is worse with laying supine, movement, walking, and palpation.  No other known aggravating or alleviating factors. She has had associated nausea and had 1 episode of nonbloody, nonbilious vomiting just prior to arrival in the emergency department.    She denies fever, chills, hematochezia, melena, dysuria, hematuria, vaginal bleeding or discharge, flank pain, numbness, weakness, headache, chest pain, shortness of breath.  No treatment prior to arrival.  The history is provided by the patient and medical records. No language interpreter was used.      Past Medical History:  Diagnosis Date   COVID-19    Hypertension 2015   IBS (irritable bowel syndrome)    Pulmonary embolism [facility])     Patient Active Problem List   Diagnosis Date Noted   Embolism ([place])    Chronic back pain 05/30/2020   Acute DVT (deep venous thrombosis) ([place]) 05/29/2020   Hyperglycemia 05/29/2020   Cellulitis 08/04/2019   Hypoxia    Intra-abdominal abscess ([place])    Intraabdominal fluid collection    Perforation of sigmoid colon due to diverticulitis 07/21/2019   Sinus bradycardia 07/21/2019   Severe sepsis ([place]) 07/21/2019    Diverticulitis 07/08/2019   MDD (major depressive disorder) 09/08/2018   HLA B27 (HLA B27 positive) 02/28/2018   History of carpal tunnel release 02/17/2018   History of anxiety 02/17/2018   History of hypertension 02/17/2018   DDD (degenerative disc disease), lumbar 02/17/2018   History of pulmonary embolism 02/17/2018   BMI 50.0-59.9, adult ([place]) 05/04/2017    Past Surgical History:  Procedure Laterality Date   FRACTURE SURGERY Left 1987   leg fracture   IR ANGIOGRAM SELECTIVE EACH ADDITIONAL VESSEL  06/02/2020   IR ANGIOGRAM SELECTIVE EACH ADDITIONAL VESSEL  06/02/2020   IR INFUSION THROMBOL ARTERIAL INITIAL (MS)  06/01/2020   IR INFUSION THROMBOL ARTERIAL INITIAL (MS)  06/02/2020   IR SINUS/FIST TUBE CHK-NON GI  07/26/2019   IR THROMB F/U EVAL ART/VEN FINAL DAY (MS)  06/02/2020   IR US GUIDE VASC ACCESS RIGHT  06/01/2020   RIGHT CARPAL     WRIST FRACTURE SURGERY       OB History   No obstetric history on file.     Family History  Problem Relation Age of Onset   Kidney failure Father     Social History   Tobacco Use   Smoking status: Every Day    Packs/day: 0.05    Types: Cigarettes   Smokeless tobacco: Never  Vaping Use   Vaping Use: Never used  Substance Use Topics   Alcohol use: Yes    Comment: very rarely    Drug use: No    Home Medications Prior to  Admission medications   Medication Sig Start Date End Date Taking? Authorizing Provider  ALPRAZolam Duanne Moron) 0.5 MG tablet Take 0.5 mg by mouth 3 (three) times daily. 06/30/19  Yes [provider]  amLODipine (NORVASC) 5 MG tablet TAKE 1 TABLET (5 MG TOTAL) BY MOUTH DAILY. Patient taking differently: Take 10 mg by mouth every evening. 06/08/20 06/08/21 Yes Swayze, Ava, DO  ARIPiprazole (ABILIFY) 5 MG tablet Take 5 mg by mouth every evening. 02/20/20  Yes [provider]  busPIRone (BUSPAR) 30 MG tablet Take 30 mg by mouth every evening. 06/29/19  Yes [provider]  desvenlafaxine  (PRISTIQ) 100 MG 24 hr tablet Take 100 mg by mouth every evening.   Yes [provider]  gabapentin (NEURONTIN) 300 MG capsule Take 600-900 mg by mouth See admin instructions. Take 2 capsules every morning and at noon then take 3 capsules at bedtime 01/25/18  Yes [provider]  JANUVIA 100 MG tablet Take 100 mg by mouth every evening. 05/17/21  Yes [provider]  ondansetron (ZOFRAN ODT) 4 MG disintegrating tablet Take 1 tablet (4 mg total) by mouth every 8 (eight) hours as needed. 05/21/21  Yes Maryam Feely A, PA-C  ondansetron (ZOFRAN) 8 MG tablet Take 8 mg by mouth every 8 (eight) hours as needed for nausea or vomiting. 01/31/20  Yes [provider]  oxyCODONE (ROXICODONE) 5 MG immediate release tablet Take 1 tablet (5 mg total) by mouth every 6 (six) hours as needed for severe pain. 05/21/21  Yes Hue Steveson A, PA-C  warfarin (COUMADIN) 5 MG tablet Take 5 mg by mouth See admin instructions. Take 6m by mouth once every evening, except on Sunday take 7.521m(one and one-half tablet) 06/13/20  Yes [provider]  amLODipine (NORVASC) 10 MG tablet Take 10 mg by mouth daily. 06/20/20   [provider]  metFORMIN (GLUCOPHAGE) 500 MG tablet TAKE 1 TABLET (500 MG TOTAL) BY MOUTH TWO TIMES DAILY WITH A MEAL. Patient not taking: No sig reported 06/08/20 06/08/21  Swayze, Ava, DO  tiZANidine (ZANAFLEX) 4 MG tablet Take 4 mg by mouth at bedtime as needed for muscle spasms. 02/04/18   [provider]  warfarin (COUMADIN) 7.5 MG tablet TAKE 1 TABLET (7.5 MG TOTAL) BY MOUTH DAILY AT 4 PM. Patient not taking: Reported on 05/21/2021 06/08/20 06/08/21  Swayze, Ava, DO    Allergies    Covid-19 mrna vacc (moderna) and Tape  Review of Systems   Review of Systems  Constitutional:  Negative for activity change, chills, diaphoresis and fever.  HENT:  Negative for sore throat.   Respiratory:  Negative for shortness of breath.   Cardiovascular:   Negative for chest pain.  Gastrointestinal:  Positive for abdominal pain, nausea and vomiting. Negative for blood in stool, constipation and diarrhea.  Genitourinary:  Negative for decreased urine volume, dysuria, enuresis, flank pain, frequency, hematuria, urgency, vaginal bleeding and vaginal pain.  Musculoskeletal:  Negative for back pain, gait problem, joint swelling, myalgias, neck pain and neck stiffness.  Skin:  Negative for rash.  Allergic/Immunologic: Negative for immunocompromised state.  Neurological:  Negative for seizures, syncope, weakness, light-headedness, numbness and headaches.  Psychiatric/Behavioral:  Negative for confusion.    Physical Exam Updated Vital Signs BP (!) 173/88   Pulse 95   Temp 98.3 F (36.8 C) (Oral)   Resp (!) 22   Ht 5' 6"  (1.676 m)   Wt (!) 154.2 kg   SpO2 94%   BMI 54.88 kg/m  Physical Exam Vitals and nursing note reviewed.  Constitutional:      General: She is not in acute distress.    Appearance: She is not ill-appearing, toxic-appearing or diaphoretic.  HENT:     Head: Normocephalic.  Eyes:     Conjunctiva/sclera: Conjunctivae normal.  Cardiovascular:     Rate and Rhythm: Normal rate and regular rhythm.     Heart sounds: No murmur heard.   No friction rub. No gallop.  Pulmonary:     Effort: Pulmonary effort is normal. No respiratory distress.     Breath sounds: No stridor. No wheezing, rhonchi or rales.  Chest:     Chest wall: No tenderness.  Abdominal:     General: There is no distension.     Palpations: Abdomen is soft. There is no mass.     Tenderness: There is abdominal tenderness. There is no right CVA tenderness, left CVA tenderness, guarding or rebound.     Hernia: No hernia is present.     Comments: Tender to palpation in the left lower quadrant.  No rebound or guarding.  Hypoactive bowel sounds in all 4 quadrants.  No tenderness over McBurney's point.  No CVA tenderness bilaterally.  Negative Murphy sign.   Musculoskeletal:     Cervical back: Neck supple.     Right lower leg: No edema.     Left lower leg: No edema.  Skin:    General: Skin is warm.     Findings: No rash.  Neurological:     Mental Status: She is alert.  Psychiatric:        Behavior: Behavior normal.    ED Results / Procedures / Treatments   Labs (all labs ordered are listed, but only abnormal results are displayed) Labs Reviewed  CBC WITH DIFFERENTIAL/PLATELET - Abnormal; Notable for the following components:      Result Value   WBC 12.3 (*)    RBC 5.92 (*)    Hemoglobin 15.9 (*)    HCT 51.4 (*)    RDW 16.5 (*)    Platelets 494 (*)    Neutro Abs 8.6 (*)    All other components within normal limits  COMPREHENSIVE METABOLIC PANEL - Abnormal; Notable for the following components:   Glucose, Bld 267 (*)    All other components within normal limits  URINALYSIS, ROUTINE W REFLEX MICROSCOPIC - Abnormal; Notable for the following components:   Color, Urine YELLOW (*)    APPearance CLEAR (*)    Glucose, UA 500 (*)    Protein, ur TRACE (*)    Bacteria, UA RARE (*)    All other components within normal limits  RESP PANEL BY RT-PCR (FLU A&B, COVID) ARPGX2  LIPASE, BLOOD  I-STAT BETA HCG BLOOD, ED ([facility], [facility], [facility] ONLY)    EKG None  Radiology CT ABDOMEN PELVIS W CONTRAST  Result Date: 05/21/2021 CLINICAL DATA:  Left lower quadrant abdominal pain, leukocytosis EXAM: CT ABDOMEN AND PELVIS WITH CONTRAST TECHNIQUE: Multidetector CT imaging of the abdomen and pelvis was performed using the standard protocol following bolus administration of intravenous contrast. CONTRAST:  12m OMNIPAQUE IOHEXOL 350 MG/ML SOLN COMPARISON:  01/10/2020 FINDINGS: Lower chest: The visualized lung bases are clear. The visualized heart and pericardium are unremarkable. Hepatobiliary: Severe hepatic steatosis with focal fatty sparing within the gallbladder fossa and central aspect of segment 4 B. the degree of hepatic steatosis appears progressive  since prior examination. No enhancing intrahepatic mass. No intra or extrahepatic biliary ductal dilation. Gallbladder is unremarkable. Pancreas:  Unremarkable Spleen: Unremarkable Adrenals/Urinary Tract: Severe sigmoid diverticulosis without superimposed acute inflammatory change. The stomach, small bowel, and large bowel are otherwise unremarkable. Appendix normal. No free intraperitoneal gas or fluid. Stomach/Bowel: Stomach is within normal limits. Appendix appears normal. No evidence of bowel wall thickening, distention, or inflammatory changes. Vascular/Lymphatic: No significant vascular findings are present. No enlarged abdominal or pelvic lymph nodes. Reproductive: Uterus and bilateral adnexa are unremarkable. Other: No abdominal wall hernia. Musculoskeletal: No acute bone abnormality. No lytic or blastic bone lesion. IMPRESSION: No acute intra-abdominal pathology. No definite radiographic explanation for the patient's reported symptoms. Severe sigmoid diverticulosis without superimposed acute inflammatory change. Progressive, severe hepatic steatosis. Electronically Signed   By: Fidela Salisbury M.D.   On: 05/21/2021 02:07   US PELVIC COMPLETE WITH TRANSVAGINAL  Result Date: 05/21/2021 CLINICAL DATA:  Abdominal pain EXAM: TRANSABDOMINAL AND TRANSVAGINAL ULTRASOUND OF PELVIS TECHNIQUE: Both transabdominal and transvaginal ultrasound examinations of the pelvis were performed. Transabdominal technique was performed for global imaging of the pelvis including uterus, ovaries, adnexal regions, and pelvic cul-de-sac. It was necessary to proceed with endovaginal exam following the transabdominal exam to visualize the uterus and ovaries. COMPARISON:  Abdominal CT from earlier today FINDINGS: Uterus Measurements: 9 x 4 x 4 cm = volume: 65 mL. No fibroids or other mass visualized. Endometrium Thickness: 6 mm.  No focal abnormality visualized. Right ovary Not visualized.  No adnexal mass Left ovary Not visualized.  No  adnexal mass Other findings No abnormal free fluid. IMPRESSION: 1. No explanation for pain. 2. Nonvisualized ovaries but both appeared normal on preceding CT. Electronically Signed   By: Jorje Guild M.D.   On: 05/21/2021 04:30    Procedures Procedures   Medications Ordered in ED Medications  ketorolac (TORADOL) 30 MG/ML injection 30 mg (has no administration in time range)  ondansetron (ZOFRAN) injection 4 mg (4 mg Intravenous Given 05/21/21 0039)  HYDROmorphone (DILAUDID) injection 1 mg (1 mg Intravenous Given 05/21/21 0038)  sodium chloride 0.9 % bolus 1,000 mL (0 mLs Intravenous Stopped 05/21/21 0249)  iohexol (OMNIPAQUE) 350 MG/ML injection 100 mL (100 mLs Intravenous Contrast Given 05/21/21 0135)  HYDROmorphone (DILAUDID) injection 0.5 mg (0.5 mg Intravenous Given 05/21/21 0132)  sodium chloride (PF) 0.9 % injection (  Given 05/21/21 0248)    ED Course  I have reviewed the triage vital signs and the nursing notes.  Pertinent labs & imaging results that were available during my care of the patient were reviewed by me and considered in my medical decision making (see chart for details).    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  She had 1 episode of nonbloody, nonbilious vomiting just prior to arrival accompanied by nausea.  No constitutional symptoms.  Markedly hypertensive and tachycardic in the 120s on arrival.  I suspect this may have been secondary to pain as her blood pressure significantly improved after she was given 1 dose of Dilaudid, Zofran, and IV fluids.  On exam, she is tender to palpation in the left  lower quadrant, but does not have any peritoneal signs.  Labs  and imaging of been reviewed and independently interpreted by me.  Work-up is overall reassuring.  She does have a leukocytosis of 12.3, but I suspect that this is hemoconcentrated given her H/H.  She was treated with IV fluids.  Glucose is elevated at 267, but she has a normal bicarb and anion gap.  She does have mild glucosuria in her urine, which is new.  However, no evidence of DKA or HHS.  Pregnancy test is negative.  Urinalysis is not concerning for infection.  CT abdomen pelvis did demonstrate severe diverticulosis, but no diverticulitis, or other acute findings to explain her pain.  She did express concern that pain may be related to her ovary, but on exam, she did not appear focally tender over her bilateral pelvic regions.  She denies any vaginal bleeding or vaginal discharge.  No abnormalities noted to ovaries on CT.  However, given that patient was then significant pain, to be thorough, pelvic ultrasound was obtained.  Although ovaries were not able to be visualized, there were no noted abnormalities on CT.  No other abnormalities noted on pelvic ultrasound.  I have a low suspicion for peritonitis, diverticulitis, bowel obstruction, mesenteric ischemia, pyelonephritis, ureterolithiasis, cholecystitis, pancreatitis, GI bleed.  Given the patient did initially state that muscle several days ago before symptoms worsen, I think it is reasonable to treat the patient for abdominal wall musculoskeletal pain.  She has been advised to continue to take her home tizanidine.  We will send the patient home with a short course of oxycodone for severe, uncontrollable pain.  She can also take Tylenol.  Patient has been advised to continue to avoid NSAIDs.  Home supportive care has also been discussed.  She can follow-up with her primary care provider for reevaluation.  She is hemodynamically stable and in no acute distress.  Safe for discharge home with  outpatient follow-up as discussed.    Final Clinical Impression(s) / ED Diagnoses Final diagnoses:  Abdominal pain  Abdominal wall pain in left lower quadrant  Glucosuria    Rx / DC Orders ED Discharge Orders          Ordered    oxyCODONE (ROXICODONE) 5 MG immediate release tablet  Every 6 hours PRN        05/21/21 0449    ondansetron (ZOFRAN ODT) 4 MG disintegrating tablet  Every 8 hours PRN        05/21/21 0451             Deaire Mcwhirter A, PA-C 05/21/21 0731    Mesner, Corene Cornea, MD 05/21/21 2355

## 2021-06-01 ENCOUNTER — Other Ambulatory Visit: Payer: Self-pay | Admitting: Physician Assistant

## 2021-06-01 ENCOUNTER — Other Ambulatory Visit: Payer: Self-pay | Admitting: Sports Medicine

## 2021-06-01 DIAGNOSIS — G8929 Other chronic pain: Secondary | ICD-10-CM

## 2021-06-01 DIAGNOSIS — M545 Low back pain, unspecified: Secondary | ICD-10-CM

## 2021-06-01 DIAGNOSIS — Z1231 Encounter for screening mammogram for malignant neoplasm of breast: Secondary | ICD-10-CM

## 2021-06-11 ENCOUNTER — Other Ambulatory Visit: Payer: Self-pay

## 2021-06-11 ENCOUNTER — Ambulatory Visit
Admission: RE | Admit: 2021-06-11 | Discharge: 2021-06-11 | Disposition: A | Discharge status: Home / Self Care | Payer: HMO | Source: Ambulatory Visit | Attending: Sports Medicine | Admitting: Sports Medicine

## 2021-06-11 DIAGNOSIS — G8929 Other chronic pain: Secondary | ICD-10-CM

## 2021-06-11 DIAGNOSIS — M545 Low back pain, unspecified: Secondary | ICD-10-CM

## 2021-06-11 MED ORDER — IOPAMIDOL (ISOVUE-M 200) INJECTION 41%
1.0000 mL | Freq: Once | INTRAMUSCULAR | Status: AC
  Administered 2021-06-11: 1 mL via EPIDURAL

## 2021-06-11 MED ORDER — METHYLPREDNISOLONE ACETATE 40 MG/ML INJ SUSP (RADIOLOG
80.0000 mg | Freq: Once | INTRAMUSCULAR | Status: AC
  Administered 2021-06-11: 80 mg via EPIDURAL

## 2021-06-11 NOTE — Discharge Instructions (Signed)

## 2021-06-26 ENCOUNTER — Ambulatory Visit
Admission: RE | Admit: 2021-06-26 | Discharge: 2021-06-26 | Disposition: A | Discharge status: Home / Self Care | Payer: HMO | Source: Ambulatory Visit | Attending: Physician Assistant | Admitting: Physician Assistant

## 2021-06-26 ENCOUNTER — Other Ambulatory Visit: Payer: Self-pay

## 2021-06-26 DIAGNOSIS — Z1231 Encounter for screening mammogram for malignant neoplasm of breast: Secondary | ICD-10-CM

## 2021-07-24 ENCOUNTER — Other Ambulatory Visit: Payer: Self-pay | Admitting: Gastroenterology

## 2021-09-28 ENCOUNTER — Encounter (HOSPITAL_COMMUNITY): Payer: Self-pay | Admitting: Gastroenterology
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
## 2021-10-05 NOTE — Anesthesia Preprocedure Evaluation (Addendum)
Anesthesia Evaluation  Patient identified by MRN, date of birth, ID band Patient awake    Reviewed: Allergy & Precautions, NPO status , Patient's Chart, lab work & pertinent test results  Airway Mallampati: II  TM Distance: >3 FB     Dental   Pulmonary Current Smoker and Patient abstained from smoking.,    breath sounds clear to auscultation       Cardiovascular hypertension,  Rhythm:Regular Rate:Normal     Neuro/Psych PSYCHIATRIC DISORDERS    GI/Hepatic Neg liver ROS, History noted Dr. Nyoka Cowden   Endo/Other  negative endocrine ROS  Renal/GU negative Renal ROS     Musculoskeletal  (+) Arthritis ,   Abdominal   Peds  Hematology   Anesthesia Other Findings   Reproductive/Obstetrics                            Anesthesia Physical Anesthesia Plan  ASA: 3  Anesthesia Plan: MAC   Post-op Pain Management:    Induction: Intravenous  PONV Risk Score and Plan: Ondansetron and Propofol infusion  Airway Management Planned:   Additional Equipment:   Intra-op Plan:   Post-operative Plan:   Informed Consent: I have reviewed the patients History and Physical, chart, labs and discussed the procedure including the risks, benefits and alternatives for the proposed anesthesia with the patient or authorized representative who has indicated his/her understanding and acceptance.     Dental advisory given  Plan Discussed with: CRNA and Anesthesiologist  Anesthesia Plan Comments:         Anesthesia Quick Evaluation

## 2021-10-06 ENCOUNTER — Encounter (HOSPITAL_COMMUNITY): Payer: Self-pay | Admitting: Gastroenterology

## 2021-10-06 ENCOUNTER — Encounter (HOSPITAL_COMMUNITY)
Admission: RE | Disposition: A | Discharge status: Home / Self Care | Payer: Self-pay | Source: Home / Self Care | Attending: Gastroenterology

## 2021-10-06 ENCOUNTER — Ambulatory Visit (HOSPITAL_BASED_OUTPATIENT_CLINIC_OR_DEPARTMENT_OTHER): Payer: HMO | Admitting: Certified Registered Nurse Anesthetist

## 2021-10-06 ENCOUNTER — Ambulatory Visit (HOSPITAL_COMMUNITY)
Admission: RE | Admit: 2021-10-06 | Discharge: 2021-10-06 | Disposition: A | Discharge status: Home / Self Care | Payer: HMO | Attending: Gastroenterology | Admitting: Gastroenterology

## 2021-10-06 ENCOUNTER — Other Ambulatory Visit: Payer: Self-pay

## 2021-10-06 ENCOUNTER — Ambulatory Visit (HOSPITAL_COMMUNITY): Payer: HMO | Admitting: Certified Registered Nurse Anesthetist

## 2021-10-06 DIAGNOSIS — Z8616 Personal history of COVID-19: Secondary | ICD-10-CM | POA: Insufficient documentation

## 2021-10-06 DIAGNOSIS — F17200 Nicotine dependence, unspecified, uncomplicated: Secondary | ICD-10-CM | POA: Diagnosis not present

## 2021-10-06 DIAGNOSIS — K5730 Diverticulosis of large intestine without perforation or abscess without bleeding: Secondary | ICD-10-CM

## 2021-10-06 DIAGNOSIS — I10 Essential (primary) hypertension: Secondary | ICD-10-CM | POA: Diagnosis not present

## 2021-10-06 DIAGNOSIS — F419 Anxiety disorder, unspecified: Secondary | ICD-10-CM | POA: Insufficient documentation

## 2021-10-06 DIAGNOSIS — Z86718 Personal history of other venous thrombosis and embolism: Secondary | ICD-10-CM | POA: Insufficient documentation

## 2021-10-06 DIAGNOSIS — R197 Diarrhea, unspecified: Secondary | ICD-10-CM | POA: Diagnosis not present

## 2021-10-06 DIAGNOSIS — M797 Fibromyalgia: Secondary | ICD-10-CM | POA: Insufficient documentation

## 2021-10-06 DIAGNOSIS — K219 Gastro-esophageal reflux disease without esophagitis: Secondary | ICD-10-CM | POA: Diagnosis not present

## 2021-10-06 DIAGNOSIS — K760 Fatty (change of) liver, not elsewhere classified: Secondary | ICD-10-CM | POA: Diagnosis not present

## 2021-10-06 DIAGNOSIS — Z6841 Body Mass Index (BMI) 40.0 and over, adult: Secondary | ICD-10-CM | POA: Diagnosis not present

## 2021-10-06 DIAGNOSIS — Z86711 Personal history of pulmonary embolism: Secondary | ICD-10-CM | POA: Insufficient documentation

## 2021-10-06 DIAGNOSIS — Z79899 Other long term (current) drug therapy: Secondary | ICD-10-CM | POA: Diagnosis not present

## 2021-10-06 DIAGNOSIS — K3189 Other diseases of stomach and duodenum: Secondary | ICD-10-CM

## 2021-10-06 DIAGNOSIS — F32A Depression, unspecified: Secondary | ICD-10-CM | POA: Insufficient documentation

## 2021-10-06 DIAGNOSIS — Z7901 Long term (current) use of anticoagulants: Secondary | ICD-10-CM | POA: Diagnosis not present

## 2021-10-06 DIAGNOSIS — K648 Other hemorrhoids: Secondary | ICD-10-CM

## 2021-10-06 HISTORY — PX: ESOPHAGOGASTRODUODENOSCOPY (EGD) WITH PROPOFOL: SHX5813

## 2021-10-06 HISTORY — PX: BIOPSY: SHX5522

## 2021-10-06 HISTORY — PX: COLONOSCOPY WITH PROPOFOL: SHX5780

## 2021-10-06 LAB — POCT I-STAT, CHEM 8
BUN: 14 mg/dL (ref 6–20)
Calcium, Ion: 1.06 mmol/L — ABNORMAL LOW (ref 1.15–1.40)
Chloride: 103 mmol/L (ref 98–111)
Creatinine, Ser: 0.6 mg/dL (ref 0.44–1.00)
Glucose, Bld: 180 mg/dL — ABNORMAL HIGH (ref 70–99)
HCT: 51 % — ABNORMAL HIGH (ref 36.0–46.0)
Hemoglobin: 17.3 g/dL — ABNORMAL HIGH (ref 12.0–15.0)
Potassium: 4 mmol/L (ref 3.5–5.1)
Sodium: 136 mmol/L (ref 135–145)
TCO2: 24 mmol/L (ref 22–32)

## 2021-10-06 SURGERY — COLONOSCOPY WITH PROPOFOL
Anesthesia: Monitor Anesthesia Care

## 2021-10-06 SURGERY — ESOPHAGOGASTRODUODENOSCOPY (EGD) WITH PROPOFOL
Anesthesia: Monitor Anesthesia Care

## 2021-10-06 MED ORDER — PROPOFOL 500 MG/50ML IV EMUL
INTRAVENOUS | Status: DC | PRN
  Administered 2021-10-06: 125 ug/kg/min via INTRAVENOUS

## 2021-10-06 MED ORDER — PHENYLEPHRINE HCL (PRESSORS) 10 MG/ML IV SOLN
INTRAVENOUS | Status: AC
  Filled 2021-10-06: qty 1

## 2021-10-06 MED ORDER — SODIUM CHLORIDE 0.9 % IV SOLN: INTRAVENOUS | Status: DC

## 2021-10-06 MED ORDER — LACTATED RINGERS IV SOLN: INTRAVENOUS | Status: DC | PRN

## 2021-10-06 MED ORDER — LIDOCAINE 2% (20 MG/ML) 5 ML SYRINGE
INTRAMUSCULAR | Status: DC | PRN
  Administered 2021-10-06: 100 mg via INTRAVENOUS

## 2021-10-06 MED ORDER — PROPOFOL 10 MG/ML IV BOLUS
INTRAVENOUS | Status: DC | PRN
  Administered 2021-10-06 (×2): 20 mg via INTRAVENOUS
  Administered 2021-10-06: 30 mg via INTRAVENOUS

## 2021-10-06 MED ORDER — PROPOFOL 1000 MG/100ML IV EMUL
INTRAVENOUS | Status: AC
  Filled 2021-10-06: qty 100

## 2021-10-06 SURGICAL SUPPLY — 25 items

## 2021-10-06 NOTE — Op Note (Signed)
[facility] Patient Name: Holly Moon Procedure Date: 10/06/2021 MRN: [id number] Attending MD: Ronnette Juniper , MD Date of Birth: 11/11/1970 CSN: [id number] Age: 51 Admit Type: Outpatient Procedure:                Upper GI endoscopy Indications:              Positive celiac serologies, Diarrhea Providers:                Ronnette Juniper, MD, Jaci Carrel, RN, Cherylynn Ridges,                            Technician, Christell Faith, CRNA Referring MD:             Roddie Mc Medicines:                Monitored Anesthesia Care Complications:            No immediate complications. Estimated blood loss:                            Minimal. Estimated Blood Loss:     Estimated blood loss was minimal. Procedure:                Pre-Anesthesia Assessment:                           - Prior to the procedure, a History and Physical                            was performed, and patient medications and                            allergies were reviewed. The patient's tolerance of                            previous anesthesia was also reviewed. The risks                            and benefits of the procedure and the sedation                            options and risks were discussed with the patient.                            All questions were answered, and informed consent                            was obtained. Prior Anticoagulants: The patient has                            taken Coumadin (warfarin), last dose was 5 days                            prior to procedure. ASA Grade Assessment: III - A  patient with severe systemic disease. After                            reviewing the risks and benefits, the patient was                            deemed in satisfactory condition to undergo the                            procedure.                           After obtaining informed consent, the endoscope was                            passed under direct  vision. Throughout the                            procedure, the patient's blood pressure, pulse, and                            oxygen saturations were monitored continuously. The                            [id number] Olympus endoscope was introduced                            through the mouth, and advanced to the second part                            of duodenum. The upper GI endoscopy was                            accomplished without difficulty. The patient                            tolerated the procedure well. Findings:      The examined esophagus was normal.      The Z-line was regular and was found 35 cm from the incisors.      A few dispersed diminutive erosions with no bleeding and no stigmata of       recent bleeding were found in the cardia, in the gastric fundus and in       the gastric body.      Localized mildly erythematous mucosa without bleeding was found in the       gastric antrum. Biopsies were taken with a cold forceps for Helicobacter       pylori testing.      The cardia and gastric fundus were normal on retroflexion.      The examined duodenum was normal. Biopsies for histology were taken with       a cold forceps for evaluation of celiac disease. Impression:               - Normal esophagus.                           -  Z-line regular, 35 cm from the incisors.                           - Erosive gastropathy with no bleeding and no                            stigmata of recent bleeding.                           - Erythematous mucosa in the antrum. Biopsied.                           - Normal examined duodenum. Biopsied. Moderate Sedation:      Patient did not receive moderate sedation for this procedure, but       instead received monitored anesthesia care. Recommendation:           - Patient has a contact number available for                            emergencies. The signs and symptoms of potential                            delayed complications  were discussed with the                            patient. Return to normal activities tomorrow.                            Written discharge instructions were provided to the                            patient.                           - Resume regular diet.                           - Continue present medications.                           - Await pathology results.                           - Resume Coumadin (warfarin) at prior dose tomorrow.                           - Perform a colonoscopy today. Procedure Code(s):        --- Professional ---                           [phone], Esophagogastroduodenoscopy, flexible,                            transoral; with biopsy, single or multiple Diagnosis Code(s):        --- Professional ---  K31.89, Other diseases of stomach and duodenum                           R76.8, Other specified abnormal immunological                            findings in serum                           R19.7, Diarrhea, unspecified CPT copyright 2019 American Medical Association. All rights reserved. The codes documented in this report are preliminary and upon coder review may  be revised to meet current compliance requirements. Ronnette Juniper, MD 10/06/2021 8:45:39 AM This report has been signed electronically. Number of Addenda: 0

## 2021-10-06 NOTE — Anesthesia Procedure Notes (Signed)
Procedure Name: MAC Date/Time: 10/06/2021 8:10 AM Performed by: West Pugh, CRNA Pre-anesthesia Checklist: Patient identified, Emergency Drugs available, Suction available, Patient being monitored and Timeout performed Patient Re-evaluated:Patient Re-evaluated prior to induction Oxygen Delivery Method: Simple face mask Preoxygenation: Pre-oxygenation with 100% oxygen Induction Type: IV induction Placement Confirmation: positive ETCO2 Dental Injury: Teeth and Oropharynx as per pre-operative assessment

## 2021-10-06 NOTE — Op Note (Signed)
[facility] Patient Name: Holly Moon Procedure Date: 10/06/2021 MRN: [id number] Attending MD: Ronnette Juniper , MD Date of Birth: 08-Jul-1971 CSN: [id number] Age: 51 Admit Type: Outpatient Procedure:                Colonoscopy Indications:              This is the patient's first colonoscopy, Diarrhea,                            history of diverticulitis-colon perforation,                            abnormal Ct abdomen Providers:                Ronnette Juniper, MD, Jaci Carrel, RN, Cherylynn Ridges,                            Technician, Christell Faith, CRNA Referring MD:             Roddie Mc Medicines:                Monitored Anesthesia Care Complications:            No immediate complications. Estimated blood loss:                            Minimal. Estimated Blood Loss:     Estimated blood loss was minimal. Procedure:                Pre-Anesthesia Assessment:                           - Prior to the procedure, a History and Physical                            was performed, and patient medications and                            allergies were reviewed. The patient's tolerance of                            previous anesthesia was also reviewed. The risks                            and benefits of the procedure and the sedation                            options and risks were discussed with the patient.                            All questions were answered, and informed consent                            was obtained. Prior Anticoagulants: The patient has                            taken Coumadin (warfarin), last  dose was 5 days                            prior to procedure. ASA Grade Assessment: III - A                            patient with severe systemic disease. After                            reviewing the risks and benefits, the patient was                            deemed in satisfactory condition to undergo the                             procedure.                           - Prior to the procedure, a History and Physical                            was performed, and patient medications and                            allergies were reviewed. The patient's tolerance of                            previous anesthesia was also reviewed. The risks                            and benefits of the procedure and the sedation                            options and risks were discussed with the patient.                            All questions were answered, and informed consent                            was obtained. Prior Anticoagulants: The patient has                            taken Coumadin (warfarin), last dose was 5 days                            prior to procedure. ASA Grade Assessment: III - A                            patient with severe systemic disease. After                            reviewing the risks and benefits, the patient was  deemed in satisfactory condition to undergo the                            procedure.                           After obtaining informed consent, the colonoscope                            was passed under direct vision. Throughout the                            procedure, the patient's blood pressure, pulse, and                            oxygen saturations were monitored continuously. The                            [id number] Olympus colonoscope was                            introduced through the anus and advanced to the the                            terminal ileum. The colonoscopy was performed                            without difficulty. The patient tolerated the                            procedure well. The quality of the bowel                            preparation was fair. Scope In: 8:22:59 AM Scope Out: 8:40:52 AM Scope Withdrawal Time: 0 hours 12 minutes 46 seconds  Total Procedure Duration: 0 hours 17 minutes 53 seconds  Findings:       Hemorrhoids were found on perianal exam.      The terminal ileum appeared normal.      Multiple small and large-mouthed diverticula were found in the sigmoid       colon, descending colon and proximal transverse colon.      Biopsies for histology were taken with a cold forceps for evaluation of       microscopic colitis.      A moderate amount of semi-solid solid stool was found in the sigmoid       colon and in the descending colon, making visualization difficult.       Lavage of the area was performed, resulting in clearance with fair       visualization.      Non-bleeding internal hemorrhoids were found during retroflexion. Impression:               - Preparation of the colon was fair.                           - Hemorrhoids found on perianal exam.                           -  The examined portion of the ileum was normal.                           - Diverticulosis in the sigmoid colon, in the                            descending colon and in the proximal transverse                            colon.                           - Stool in the sigmoid colon and in the descending                            colon.                           - Non-bleeding internal hemorrhoids.                           - Biopsies were taken with a cold forceps for                            evaluation of microscopic colitis. Moderate Sedation:      Patient did not receive moderate sedation for this procedure, but       instead received monitored anesthesia care. Recommendation:           - Patient has a contact number available for                            emergencies. The signs and symptoms of potential                            delayed complications were discussed with the                            patient. Return to normal activities tomorrow.                            Written discharge instructions were provided to the                            patient.                           - High fiber  diet.                           - Continue present medications.                           - Await pathology results.                           - Repeat colonoscopy in 10 years for screening  purposes. Procedure Code(s):        --- Professional ---                           [phone], Colonoscopy, flexible; with biopsy, single                            or multiple Diagnosis Code(s):        --- Professional ---                           K64.8, Other hemorrhoids                           R19.7, Diarrhea, unspecified                           K57.30, Diverticulosis of large intestine without                            perforation or abscess without bleeding CPT copyright 2019 American Medical Association. All rights reserved. The codes documented in this report are preliminary and upon coder review may  be revised to meet current compliance requirements. Ronnette Juniper, MD 10/06/2021 8:49:32 AM This report has been signed electronically. Number of Addenda: 0

## 2021-10-06 NOTE — Anesthesia Postprocedure Evaluation (Signed)
Anesthesia Post Note  Patient: Holly Moon  Procedure(s) Performed: ESOPHAGOGASTRODUODENOSCOPY (EGD) WITH PROPOFOL COLONOSCOPY WITH PROPOFOL BIOPSY     Patient location during evaluation: Endoscopy Anesthesia Type: MAC Level of consciousness: awake Pain management: pain level controlled Vital Signs Assessment: post-procedure vital signs reviewed and stable Respiratory status: spontaneous breathing Cardiovascular status: stable Postop Assessment: no apparent nausea or vomiting Anesthetic complications: no   No notable events documented.  Last Vitals:  Vitals:   10/06/21 0910 10/06/21 0920  BP: (!) 159/105 (!) 177/90  Pulse: 83 91  Resp: 19 (!) 23  Temp:    SpO2: 93% 96%    Last Pain:  Vitals:   10/06/21 0920  TempSrc:   PainSc: 0-No pain                 Jimmy Stipes

## 2021-10-06 NOTE — Discharge Instructions (Signed)

## 2021-10-06 NOTE — Transfer of Care (Signed)
Immediate Anesthesia Transfer of Care Note  Patient: Holly Moon  Procedure(s) Performed: ESOPHAGOGASTRODUODENOSCOPY (EGD) WITH PROPOFOL COLONOSCOPY WITH PROPOFOL BIOPSY  Patient Location: PACU and Endoscopy Unit  Anesthesia Type:MAC  Level of Consciousness: awake, alert , oriented and patient cooperative  Airway & Oxygen Therapy: Patient Spontanous Breathing and Patient connected to face mask oxygen  Post-op Assessment: Report given to RN and Post -op Vital signs reviewed and stable  Post vital signs: Reviewed and stable  Last Vitals:  Vitals Value Taken Time  BP    Temp    Pulse 87 10/06/21 0848  Resp 21 10/06/21 0848  SpO2 99 % 10/06/21 0848  Vitals shown include unvalidated device data.  Last Pain:  Vitals:   10/06/21 0716  TempSrc: Temporal  PainSc: 0-No pain         Complications: No notable events documented.

## 2021-10-07 ENCOUNTER — Encounter (HOSPITAL_COMMUNITY): Payer: Self-pay | Admitting: Gastroenterology

## 2021-10-07 LAB — SURGICAL PATHOLOGY

## 2022-01-05 ENCOUNTER — Emergency Department (HOSPITAL_COMMUNITY): Payer: HMO

## 2022-01-05 ENCOUNTER — Emergency Department (HOSPITAL_COMMUNITY)
Admission: EM | Admit: 2022-01-05 | Discharge: 2022-01-05 | Disposition: A | Discharge status: Home / Self Care | Payer: HMO | Attending: Emergency Medicine | Admitting: Emergency Medicine

## 2022-01-05 ENCOUNTER — Other Ambulatory Visit: Payer: Self-pay

## 2022-01-05 ENCOUNTER — Encounter (HOSPITAL_COMMUNITY): Payer: Self-pay

## 2022-01-05 DIAGNOSIS — R1032 Left lower quadrant pain: Secondary | ICD-10-CM | POA: Diagnosis present

## 2022-01-05 DIAGNOSIS — Z8616 Personal history of COVID-19: Secondary | ICD-10-CM | POA: Insufficient documentation

## 2022-01-05 DIAGNOSIS — K5792 Diverticulitis of intestine, part unspecified, without perforation or abscess without bleeding: Secondary | ICD-10-CM | POA: Diagnosis not present

## 2022-01-05 DIAGNOSIS — I10 Essential (primary) hypertension: Secondary | ICD-10-CM | POA: Insufficient documentation

## 2022-01-05 DIAGNOSIS — F17210 Nicotine dependence, cigarettes, uncomplicated: Secondary | ICD-10-CM | POA: Diagnosis not present

## 2022-01-05 DIAGNOSIS — N9489 Other specified conditions associated with female genital organs and menstrual cycle: Secondary | ICD-10-CM | POA: Diagnosis not present

## 2022-01-05 LAB — COMPREHENSIVE METABOLIC PANEL
ALT: 15 U/L (ref 0–44)
AST: 14 U/L — ABNORMAL LOW (ref 15–41)
Albumin: 3.7 g/dL (ref 3.5–5.0)
Alkaline Phosphatase: 83 U/L (ref 38–126)
Anion gap: 10 (ref 5–15)
BUN: 15 mg/dL (ref 6–20)
CO2: 23 mmol/L (ref 22–32)
Calcium: 9.1 mg/dL (ref 8.9–10.3)
Chloride: 105 mmol/L (ref 98–111)
Creatinine, Ser: 0.99 mg/dL (ref 0.44–1.00)
GFR, Estimated: >60 mL/min (ref >60)
Glucose, Bld: 256 mg/dL — ABNORMAL HIGH (ref 70–99)
Potassium: 3.9 mmol/L (ref 3.5–5.1)
Sodium: 138 mmol/L (ref 135–145)
Total Bilirubin: 0.5 mg/dL (ref 0.3–1.2)
Total Protein: 8 g/dL (ref 6.5–8.1)

## 2022-01-05 LAB — CBC WITH DIFFERENTIAL/PLATELET
Abs Immature Granulocytes: 0.19 10*3/uL — ABNORMAL HIGH (ref 0.00–0.07)
Basophils Absolute: 0 10*3/uL (ref 0.0–0.1)
Basophils Relative: 0 %
Eosinophils Absolute: 0.1 10*3/uL (ref 0.0–0.5)
Eosinophils Relative: 0 %
HCT: 50.8 % — ABNORMAL HIGH (ref 36.0–46.0)
Hemoglobin: 16.6 g/dL — ABNORMAL HIGH (ref 12.0–15.0)
Immature Granulocytes: 2 %
Lymphocytes Relative: 21 %
Lymphs Abs: 2.5 10*3/uL (ref 0.7–4.0)
MCH: 29.1 pg (ref 26.0–34.0)
MCHC: 32.7 g/dL (ref 30.0–36.0)
MCV: 89.1 fL (ref 80.0–100.0)
Monocytes Absolute: 0.5 10*3/uL (ref 0.1–1.0)
Monocytes Relative: 4 %
Neutro Abs: 8.9 10*3/uL — ABNORMAL HIGH (ref 1.7–7.7)
Neutrophils Relative %: 73 %
Platelets: 472 10*3/uL — ABNORMAL HIGH (ref 150–400)
RBC: 5.7 MIL/uL — ABNORMAL HIGH (ref 3.87–5.11)
RDW: 14.8 % (ref 11.5–15.5)
WBC: 12.2 10*3/uL — ABNORMAL HIGH (ref 4.0–10.5)
nRBC: 0 % (ref 0.0–0.2)

## 2022-01-05 LAB — URINALYSIS, ROUTINE W REFLEX MICROSCOPIC
Bilirubin Urine: NEGATIVE
Glucose, UA: NEGATIVE mg/dL
Ketones, ur: 5 mg/dL — AB
Leukocytes,Ua: NEGATIVE
Nitrite: NEGATIVE
Protein, ur: 100 mg/dL — AB
RBC / HPF: >50 RBC/hpf — ABNORMAL HIGH (ref 0–5)
Specific Gravity, Urine: 1.026 (ref 1.005–1.030)
pH: 5 (ref 5.0–8.0)

## 2022-01-05 LAB — I-STAT BETA HCG BLOOD, ED (MC, WL, AP ONLY): I-stat hCG, quantitative: <5 m[IU]/mL (ref <5)

## 2022-01-05 LAB — PROTIME-INR
INR: 2.6 — ABNORMAL HIGH (ref 0.8–1.2)
Prothrombin Time: 27.9 seconds — ABNORMAL HIGH (ref 11.4–15.2)

## 2022-01-05 LAB — LIPASE, BLOOD: Lipase: 30 U/L (ref 11–51)

## 2022-01-05 MED ORDER — SODIUM CHLORIDE 0.9 % IV BOLUS
1000.0000 mL | Freq: Once | INTRAVENOUS | Status: AC
  Administered 2022-01-05: 1000 mL via INTRAVENOUS

## 2022-01-05 MED ORDER — HYDROMORPHONE HCL 1 MG/ML IJ SOLN
1.0000 mg | Freq: Once | INTRAMUSCULAR | Status: AC
  Administered 2022-01-05: 1 mg via INTRAVENOUS
  Filled 2022-01-05: qty 1

## 2022-01-05 MED ORDER — AMOXICILLIN-POT CLAVULANATE 875-125 MG PO TABS
1.0000 | ORAL_TABLET | Freq: Once | ORAL | Status: AC
  Administered 2022-01-05: 1 via ORAL
  Filled 2022-01-05: qty 1

## 2022-01-05 MED ORDER — IOHEXOL 300 MG/ML  SOLN
100.0000 mL | Freq: Once | INTRAMUSCULAR | Status: AC | PRN
  Administered 2022-01-05: 100 mL via INTRAVENOUS

## 2022-01-05 MED ORDER — AMOXICILLIN-POT CLAVULANATE 875-125 MG PO TABS: 1.0000 | ORAL_TABLET | Freq: Two times a day (BID) | ORAL | 0 refills | Status: AC

## 2022-01-05 MED ORDER — ONDANSETRON HCL 4 MG/2ML IJ SOLN
4.0000 mg | Freq: Once | INTRAMUSCULAR | Status: AC
  Administered 2022-01-05: 4 mg via INTRAVENOUS
  Filled 2022-01-05: qty 2

## 2022-01-05 MED ORDER — OXYCODONE HCL 5 MG PO TABS: 5.0000 mg | ORAL_TABLET | ORAL | 0 refills | Status: AC | PRN

## 2022-01-05 MED ORDER — SODIUM CHLORIDE (PF) 0.9 % IJ SOLN
INTRAMUSCULAR | Status: AC
  Filled 2022-01-05: qty 50

## 2022-01-05 NOTE — ED Notes (Signed)
Pt is on 2 liters of O2  ?

## 2022-01-05 NOTE — ED Provider Notes (Signed)
?[facility]-EMERGENCY DEPT ?[facility] Emergency Department ?Provider Note ?MRN:  [id number]  ?Arrival date & time: 01/05/22    ? ?Chief Complaint   ?Abdominal Pain ?  ?History of Present Illness   ?Holly Moon is a 51 y.o. 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 ? ?Left lower quadrant abdominal pain starting yesterday, getting worse and spreading to the left upper quadrant.  Feels similar to prior episode of perforated diverticulitis.  No fever, nausea but no vomiting, no diarrhea or constipation.  No chest pain or shortness of breath. ? ?Review of Systems  ?A thorough review of systems was obtained and all systems are negative except as noted in the HPI and PMH.  ? ?Patient's Health History   ? ?Past Medical History:  ?Diagnosis Date  ? COVID-19   ? Hypertension 2015  ? IBS (irritable bowel syndrome)   ? Pulmonary embolism (HCC)   ?  ?Past Surgical History:  ?Procedure Laterality Date  ? BIOPSY  10/06/2021  ? Procedure: BIOPSY;  Surgeon: Kerin Salen, MD;  Location: [facility];  Service: Gastroenterology;;  EGD and COLON  ? COLONOSCOPY WITH PROPOFOL N/A 10/06/2021  ? Procedure: COLONOSCOPY WITH PROPOFOL;  Surgeon: Kerin Salen, MD;  Location: [facility] END[facility];  Service: Gastroenterology;  Laterality: N/A;  ? ESOPHAGOGASTRODUODENOSCOPY (EGD) WITH PROPOFOL N/A 10/06/2021  ? Procedure: ESOPHAGOGASTRODUODENOSCOPY (EGD) WITH PROPOFOL;  Surgeon: Kerin Salen, MD;  Location: [facility] ENDOSCOPY;  Service: Gastroenterology;  Laterality: N/A;  ? FRACTURE SURGERY Left 1987  ? leg fracture  ? IR ANGIOGRAM SELECTIVE EACH ADDITIONAL VESSEL  06/02/2020  ? IR ANGIOGRAM SELECTIVE EACH ADDITIONAL VESSEL  06/02/2020  ? IR INFUSION THROMBOL ARTERIAL INITIAL (MS)  06/01/2020  ? IR INFUSION THROMBOL ARTERIAL INITIAL (MS)  06/02/2020  ? IR SINUS/FIST TUBE CHK-NON GI  07/26/2019  ? IR THROMB F/U EVAL ART/VEN FINAL DAY (MS)  06/02/2020  ?  IR US GUIDE VASC ACCESS RIGHT  06/01/2020  ? RIGHT CARPAL    ? WRIST FRACTURE SURGERY    ?  ?Family History  ?Problem Relation Age of Onset  ? Kidney failure Father   ? Breast cancer Neg Hx   ?  ?Social History  ? ?Socioeconomic History  ? Marital status: Married  ?  Spouse name: Not on file  ? Number of children: Not on file  ? Years of education: Not on file  ? Highest education level: Not on file  ?Occupational History  ? Not on file  ?Tobacco Use  ? Smoking status: Every Day  ?  Packs/day: 0.05  ?  Types: Cigarettes  ? Smokeless tobacco: Never  ?Vaping Use  ? Vaping Use: Never used  ?Substance and Sexual Activity  ? Alcohol use: Yes  ?  Comment: very rarely   ? Drug use: No  ? Sexual activity: Not on file  ?Other Topics Concern  ? Not on file  ?Social History Narrative  ? Not on file  ? ?Social Determinants of Health  ? ?Financial Resource Strain: Not on file  ?Food Insecurity: Not on file  ?Transportation Needs: Not on file  ?Physical Activity: Not on file  ?Stress: Not on file  ?Social Connections: Not on file  ?Intimate Partner Violence: Not on file  ?  ? ?Physical Exam  ? ?Vitals:  ? 01/05/22 0215 01/05/22 0247  ?BP: 99/80 121/81  ?Pulse: 77 71  ?Resp: 17 17  ?Temp:    ?SpO2: 95% 91%  ?  ?  CONSTITUTIONAL: Well-appearing, NAD ?NEURO/PSYCH:  Alert and oriented x 3, no focal deficits ?EYES:  eyes equal and reactive ?ENT/NECK:  no LAD, no JVD ?CARDIO: Regular rate, well-perfused, normal S1 and S2 ?PULM:  CTAB no wheezing or rhonchi ?GI/GU: Left lower quadrant tenderness to palpation, left upper quadrant tenderness to palpation, mild guarding ?MSK/SPINE:  No gross deformities, no edema ?SKIN:  no rash, atraumatic ? ? ?*Additional and/or pertinent findings included in MDM below ? ?Diagnostic and Interventional Summary  ? ? EKG Interpretation ? ?Date/Time:    ?Ventricular Rate:    ?PR Interval:    ?QRS Duration:   ?QT Interval:    ?QTC Calculation:   ?R Axis:     ?Text Interpretation:   ?  ? ?  ? ?Labs Reviewed   ?COMPREHENSIVE METABOLIC PANEL - Abnormal; Notable for the following components:  ?    Result Value  ? Glucose, Bld 256 (*)   ? AST 14 (*)   ? All other components within normal limits  ?CBC WITH DIFFERENTIAL/PLATELET - Abnormal; Notable for the following components:  ? WBC 12.2 (*)   ? RBC 5.70 (*)   ? Hemoglobin 16.6 (*)   ? HCT 50.8 (*)   ? Platelets 472 (*)   ? Neutro Abs 8.9 (*)   ? Abs Immature Granulocytes 0.19 (*)   ? All other components within normal limits  ?URINALYSIS, ROUTINE W REFLEX MICROSCOPIC - Abnormal; Notable for the following components:  ? Color, Urine AMBER (*)   ? APPearance CLOUDY (*)   ? Hgb urine dipstick LARGE (*)   ? Ketones, ur 5 (*)   ? Protein, ur 100 (*)   ? RBC / HPF >50 (*)   ? Bacteria, UA RARE (*)   ? All other components within normal limits  ?PROTIME-INR - Abnormal; Notable for the following components:  ? Prothrombin Time 27.9 (*)   ? INR 2.6 (*)   ? All other components within normal limits  ?LIPASE, BLOOD  ?I-STAT BETA HCG BLOOD, ED ([facility], [facility], [facility] ONLY)  ?  ?CT ABDOMEN PELVIS W CONTRAST  ?Final Result  ?  ?  ?Medications  ?sodium chloride 0.9 % bolus 1,000 mL (1,000 mLs Intravenous New Bag/Given 01/05/22 0100)  ?HYDROmorphone (DILAUDID) injection 1 mg (1 mg Intravenous Given 01/05/22 0100)  ?ondansetron (ZOFRAN) injection 4 mg (4 mg Intravenous Given 01/05/22 0100)  ?iohexol (OMNIPAQUE) 300 MG/ML solution 100 mL (100 mLs Intravenous Contrast Given 01/05/22 0222)  ?sodium chloride (PF) 0.9 % injection (  Given by Other 01/05/22 0239)  ?amoxicillin-clavulanate (AUGMENTIN) 875-125 MG per tablet 1 tablet (1 tablet Oral Given 01/05/22 0255)  ?  ? ?Procedures  /  Critical Care ?Procedures ? ?ED Course and Medical Decision Making  ?Initial Impression and Ddx ?Patient has a history of perforated viscus, tender with some rebound, concern for diverticulitis, recurrence of perforated viscus, small bowel obstruction.  Awaiting labs, CTA. ? ?Past medical/surgical history that increases  complexity of ED encounter: Perforated diverticulitis ? ?Interpretation of Diagnostics ?I personally reviewed the laboratory assessment and my interpretation is as follows: Minimal leukocytosis, otherwise no significant blood count or electrolyte disturbance ?   ?CT imaging is revealing sigmoid diverticulitis without complication ? ?Patient Reassessment and Ultimate Disposition/Management ?On reassessment patient feels well, vital signs normal, appropriate for discharge. ? ?Patient management required discussion with the following services or consulting groups:  None ? ?Complexity of Problems Addressed ?Acute illness or injury that poses threat of life of bodily function ? ?Additional Data  Reviewed and Analyzed ?Further history obtained from: ?Further history from spouse/family member ? ?Additional Factors Impacting ED Encounter Risk ?Prescriptions and Consideration of hospitalization ? ?Elmer Sow. Pilar Plate, MD ?[facility] Emergency Medicine ?[facility] [email or web address] ? ?Final Clinical Impressions(s) / ED Diagnoses  ? ?  ICD-10-CM   ?1. Diverticulitis  K57.92   ?  ?  ?ED Discharge Orders   ? ?      Ordered  ?  amoxicillin-clavulanate (AUGMENTIN) 875-125 MG tablet  Every 12 hours       ? 01/05/22 4081  ?  oxyCODONE (ROXICODONE) 5 MG immediate release tablet  Every 4 hours PRN       ? 01/05/22 4481  ? ?  ?  ? ?  ?  ? ?Discharge Instructions Discussed with and Provided to Patient:  ? ? ? ?Discharge Instructions   ? ?  ?You were evaluated in the Emergency Department and after careful evaluation, we did not find any emergent condition requiring admission or further testing in the hospital. ? ?Your exam/testing today is overall reassuring.  Symptoms seem to be due to diverticulitis.  Please take the Augmentin antibiotic as directed.  Recommend Tylenol 1000 mg every 4-6 hours for pain.  You can use the oxycodone medication for more significant pain. ? ?Please return to the Emergency Department if you  experience any worsening of your condition.   Thank you for allowing [place] to be a part of your care. ? ? ? ? ?  ?Sabas Sous, MD ?01/05/22 [phone] ? ?

## 2022-01-05 NOTE — ED Triage Notes (Signed)
Pt reports with upper and lower abdominal pain since yesterday.  ?

## 2022-01-05 NOTE — Discharge Instructions (Signed)
You were evaluated in the Emergency Department and after careful evaluation, we did not find any emergent condition requiring admission or further testing in the hospital. ? ?Your exam/testing today is overall reassuring.  Symptoms seem to be due to diverticulitis.  Please take the Augmentin antibiotic as directed.  Recommend Tylenol 1000 mg every 4-6 hours for pain.  You can use the oxycodone medication for more significant pain. ? ?Please return to the Emergency Department if you experience any worsening of your condition.   Thank you for allowing [place] to be a part of your care. ?

## 2022-03-24 ENCOUNTER — Other Ambulatory Visit: Payer: Self-pay | Admitting: *Deleted

## 2022-03-24 NOTE — Patient Outreach (Signed)
  Care Coordination   Initial Visit Note   03/24/2022 Name: Holly Moon No MRN: [id number] DOB: November 17, 1970  Holly Moon is a 51 y.o. year old female who sees [place], [place], [place] for primary care. I spoke with  Verner Dawn Ingman by phone today  What matters to the patients health and wellness today?  Wants to work with her primary care MD only.   SDOH assessments and interventions completed:  No     Care Coordination Interventions Activated:  No  Care Coordination Interventions:  No, not indicated   Follow up plan: No further intervention required.   Encounter Outcome:  Pt. Visit Completed   Noralyn Pick C. Burgess Estelle, MSN, [facility] Gerontological Nurse Practitioner [facility] Care Management [phone]

## 2024-03-15 NOTE — Progress Notes (Signed)
 Sent message, via epic in basket, requesting orders in epic from Careers adviser.

## 2024-03-20 NOTE — Patient Instructions (Signed)
 SURGICAL WAITING ROOM VISITATION  Patients having surgery or a procedure may have no more than 2 support people in the waiting area - these visitors may rotate.    Children under the age of 77 must have an adult with them who is not the patient.  Visitors with respiratory illnesses are discouraged from visiting and should remain at home.  If the patient needs to stay at the hospital during part of their recovery, the visitor guidelines for inpatient rooms apply. Pre-op nurse will coordinate an appropriate time for 1 support person to accompany patient in pre-op.  This support person may not rotate.    Please refer to the [facility] website for the visitor guidelines for Inpatients (after your surgery is over and you are in a regular room).       Your procedure is scheduled on:  03/27/2024    Report to [facility] Main Entrance    Report to admitting at  0700 AM   Call this number if you have problems the morning of surgery [phone]   Do not eat food :After Midnight.   After Midnight you may have the following liquids until _ 0600_____ Am DAY OF SURGERY  Water Non-Citrus Juices (without pulp, NO RED-Apple, White grape, White cranberry) Black Coffee (NO MILK/CREAM OR CREAMERS, sugar ok)  Clear Tea (NO MILK/CREAM OR CREAMERS, sugar ok) regular and decaf                             Plain Jell-O (NO RED)                                           Fruit ices (not with fruit pulp, NO RED)                                     Popsicles (NO RED)                                                               Sports drinks like Gatorade (NO RED)                     The day of surgery:  Drink ONE (1) Pre-Surgery Clear Ensure or G2 at 0600 AM the morning of surgery. Drink in one sitting. Do not sip.  This drink was given to you during your hospital  pre-op appointment visit. Nothing else to drink after completing the  Pre-Surgery Clear Ensure or G2.          If you have  questions, please contact your surgeon's office.       Oral Hygiene is also important to reduce your risk of infection.                                    Remember - BRUSH YOUR TEETH THE MORNING OF SURGERY WITH YOUR REGULAR TOOTHPASTE  DENTURES WILL BE REMOVED PRIOR TO SURGERY PLEASE DO NOT APPLY Poly grip OR  ADHESIVES!!!   Do NOT smoke after Midnight   Stop all vitamins and herbal supplements 7 days before surgery.   Take these medicines the morning of surgery with A SIP OF WATER:  buspar              Januvia- none am of surgery.   DO NOT TAKE ANY ORAL DIABETIC MEDICATIONS DAY OF YOUR SURGERY  Bring CPAP mask and tubing day of surgery.                              You may not have any metal on your body including hair pins, jewelry, and body piercing             Do not wear make-up, lotions, powders, perfumes/cologne, or deodorant  Do not wear nail polish including gel and S&S, artificial/acrylic nails, or any other type of covering on natural nails including finger and toenails. If you have artificial nails, gel coating, etc. that needs to be removed by a nail salon please have this removed prior to surgery or surgery may need to be canceled/ delayed if the surgeon/ anesthesia feels like they are unable to be safely monitored.   Do not shave  48 hours prior to surgery.               Men may shave face and neck.   Do not bring valuables to the hospital. [facility] IS NOT             RESPONSIBLE   FOR VALUABLES.   Contacts, glasses, dentures or bridgework may not be worn into surgery.   Bring small overnight bag day of surgery.   DO NOT BRING YOUR HOME MEDICATIONS TO THE HOSPITAL. PHARMACY WILL DISPENSE MEDICATIONS LISTED ON YOUR MEDICATION LIST TO YOU DURING YOUR ADMISSION IN THE HOSPITAL!    Patients discharged on the day of surgery will not be allowed to drive home.  Someone NEEDS to stay with you for the first 24 hours after anesthesia.   Special Instructions: Bring  a copy of your healthcare power of attorney and living will documents the day of surgery if you haven't scanned them before.              Please read over the following fact sheets you were given: IF YOU HAVE QUESTIONS ABOUT YOUR PRE-OP INSTRUCTIONS PLEASE CALL [phone].   If you received a COVID test during your pre-op visit  it is requested that you wear a mask when out in public, stay away from anyone that may not be feeling well and notify your surgeon if you develop symptoms. If you test positive for Covid or have been in contact with anyone that has tested positive in the last 10 days please notify you surgeon.    [facility] - Preparing for Surgery Before surgery, you can play an important role.  Because skin is not sterile, your skin needs to be as free of germs as possible.  You can reduce the number of germs on your skin by washing with CHG (chlorahexidine gluconate) soap before surgery.  CHG is an antiseptic cleaner which kills germs and bonds with the skin to continue killing germs even after washing. Please DO NOT use if you have an allergy to CHG or antibacterial soaps.  If your skin becomes reddened/irritated stop using the CHG and inform your nurse when you arrive at Short Stay. Do not shave (including legs and underarms)  for at least 48 hours prior to the first CHG shower.  You may shave your face/neck. Please follow these instructions carefully:  1.  Shower with CHG Soap the night before surgery and the  morning of Surgery.  2.  If you choose to wash your hair, wash your hair first as usual with your  normal  shampoo.  3.  After you shampoo, rinse your hair and body thoroughly to remove the  shampoo.                           4.  Use CHG as you would any other liquid soap.  You can apply chg directly  to the skin and wash                       Gently with a scrungie or clean washcloth.  5.  Apply the CHG Soap to your body ONLY FROM THE NECK DOWN.   Do not use on face/ open                            Wound or open sores. Avoid contact with eyes, ears mouth and genitals (private parts).                       Wash face,  Genitals (private parts) with your normal soap.             6.  Wash thoroughly, paying special attention to the area where your surgery  will be performed.  7.  Thoroughly rinse your body with warm water from the neck down.  8.  DO NOT shower/wash with your normal soap after using and rinsing off  the CHG Soap.                9.  Pat yourself dry with a clean towel.            10.  Wear clean pajamas.            11.  Place clean sheets on your bed the night of your first shower and do not  sleep with pets. Day of Surgery : Do not apply any lotions/deodorants the morning of surgery.  Please wear clean clothes to the hospital/surgery center.  FAILURE TO FOLLOW THESE INSTRUCTIONS MAY RESULT IN THE CANCELLATION OF YOUR SURGERY PATIENT SIGNATURE_________________________________  NURSE SIGNATURE__________________________________  ________________________________________________________________________

## 2024-03-20 NOTE — H&P (Signed)
 PREOPERATIVE H&P  Chief Complaint: RIGHT KNEE MEDIAL AND LATERAL MENISCI TEARS  HPI: Holly Moon is a 53 y.o. female who presents with a diagnosis of RIGHT KNEE MEDIAL AND LATERAL MENISCI TEARS. Symptoms are rated as moderate to severe, and have been worsening.  This is significantly impairing activities of daily living.  She has elected for surgical management.   Past Medical History:  Diagnosis Date   COVID-19    Hypertension 2015   IBS (irritable bowel syndrome)    Pulmonary embolism [facility])    Past Surgical History:  Procedure Laterality Date   BIOPSY  10/06/2021   Procedure: BIOPSY;  Surgeon: Saintclair Jasper, MD;  Location: [facility];  Service: Gastroenterology;;  EGD and COLON   COLONOSCOPY WITH PROPOFOL  N/A 10/06/2021   Procedure: COLONOSCOPY WITH PROPOFOL ;  Surgeon: Saintclair Jasper, MD;  Location: [facility];  Service: Gastroenterology;  Laterality: N/A;   ESOPHAGOGASTRODUODENOSCOPY (EGD) WITH PROPOFOL  N/A 10/06/2021   Procedure: ESOPHAGOGASTRODUODENOSCOPY (EGD) WITH PROPOFOL ;  Surgeon: Saintclair Jasper, MD;  Location: [facility];  Service: Gastroenterology;  Laterality: N/A;   FRACTURE SURGERY Left 1987   leg fracture   IR ANGIOGRAM SELECTIVE EACH ADDITIONAL VESSEL  06/02/2020   IR ANGIOGRAM SELECTIVE EACH ADDITIONAL VESSEL  06/02/2020   IR INFUSION THROMBOL ARTERIAL INITIAL (MS)  06/01/2020   IR INFUSION THROMBOL ARTERIAL INITIAL (MS)  06/02/2020   IR SINUS/FIST TUBE CHK-NON GI  07/26/2019   IR THROMB F/U EVAL ART/VEN FINAL DAY (MS)  06/02/2020   IR US  GUIDE VASC ACCESS RIGHT  06/01/2020   RIGHT CARPAL     WRIST FRACTURE SURGERY     Social History   Socioeconomic History   Marital status: Married    Spouse name: Not on file   Number of children: Not on file   Years of education: Not on file   Highest education level: Not on file  Occupational History   Not on file  Tobacco Use   Smoking status: Every Day    Current packs/day: 0.05    Types: Cigarettes   Smokeless  tobacco: Never  Vaping Use   Vaping status: Never Used  Substance and Sexual Activity   Alcohol use: Yes    Comment: very rarely    Drug use: No   Sexual activity: Not on file  Other Topics Concern   Not on file  Social History Narrative   Not on file   Social Drivers of Health   Financial Resource Strain: Low Risk  (03/15/2024)   Received from [facility]   Overall Financial Resource Strain (CARDIA)    How hard is it for you to pay for the very basics like food, housing, medical care, and heating?: Not hard at all  Food Insecurity: No Food Insecurity (03/15/2024)   Received from [facility]   Hunger Vital Sign    Within the past 12 months, you worried that your food would run out before you got the money to buy more.: Never true    Within the past 12 months, the food you bought just didn't last and you didn't have money to get more.: Never true  Transportation Needs: No Transportation Needs (03/15/2024)   Received from [facility] - Transportation    In the past 12 months, has lack of transportation kept you from medical appointments or from getting medications?: No    In the past 12 months, has lack of transportation kept you from meetings, work, or from getting things needed for daily living?:  No  Physical Activity: Inactive (03/15/2024)   Received from [facility]   Exercise Vital Sign    On average, how many days per week do you engage in moderate to strenuous exercise (like a brisk walk)?: 0 days    Minutes of Exercise per Session: Not on file  Stress: No Stress Concern Present (03/15/2024)   Received from [facility] - Occupational Stress Questionnaire    Do you feel stress - tense, restless, nervous, or anxious, or unable to sleep at night because your mind is troubled all the time - these days?: Not at all  Social Connections: Socially Integrated (03/15/2024)   Received from [facility]   Social Network     How would you rate your social network (family, work, friends)?: Good participation with social networks   Family History  Problem Relation Age of Onset   Kidney failure Father    Breast cancer Neg Hx    Allergies  Allergen Reactions   Covid-19 Mrna Vacc (Moderna) Swelling and Other (See Comments)    Arm developed blisters and a sunburned appearance and throat/tonsils were swollen the next morning   Tape     redness, irritation, blistering, painful   Prior to Admission medications   Medication Sig Start Date End Date Taking? Authorizing Provider  amLODipine  (NORVASC ) 5 MG tablet TAKE 1 TABLET (5 MG TOTAL) BY MOUTH DAILY. Patient taking differently: Take 10 mg by mouth every evening. 06/08/20 10/06/21  Swayze, Ava, DO  ARIPiprazole  (ABILIFY ) 10 MG tablet Take 10 mg by mouth every evening. 02/20/20   [provider]  busPIRone  (BUSPAR ) 30 MG tablet Take 30 mg by mouth 3 (three) times daily. 06/29/19   [provider]  desvenlafaxine  (PRISTIQ ) 100 MG 24 hr tablet Take 100 mg by mouth every evening.    [provider]  JANUVIA 100 MG tablet Take 100 mg by mouth every evening. 05/17/21   [provider]  ondansetron  (ZOFRAN  ODT) 4 MG disintegrating tablet Take 1 tablet (4 mg total) by mouth every 8 (eight) hours as needed. 05/21/21   McDonald, Mia A, PA-C  ondansetron  (ZOFRAN ) 8 MG tablet Take 8 mg by mouth every 8 (eight) hours as needed for nausea or vomiting. 01/31/20   [provider]  oxyCODONE  (ROXICODONE ) 5 MG immediate release tablet Take 1 tablet (5 mg total) by mouth every 4 (four) hours as needed for severe pain. 01/05/22   Theadore Ozell HERO, MD  PRESCRIPTION MEDICATION Inject into the skin once. Aripiprazole  Injection    [provider]  tiZANidine  (ZANAFLEX ) 4 MG tablet Take 4-6 mg by mouth every 8 (eight) hours as needed for muscle spasms. 02/04/18   [provider]  traZODone (DESYREL) 100 MG tablet Take 150 mg by mouth at  bedtime.    [provider]  warfarin (COUMADIN ) 5 MG tablet Take 5-7.5 mg by mouth See admin instructions. Take 5mg  by mouth once every evening, except on Sunday take 7.5mg  06/13/20   [provider]     Positive ROS: All other systems have been reviewed and were otherwise negative with the exception of those mentioned in the HPI and as above.  Physical Exam: General: Alert, no acute distress, morbidly obese, limping with cane Cardiovascular: No pedal edema Respiratory: No cyanosis, no use of accessory musculature GI: No organomegaly, abdomen is soft and non-tender Skin: No lesions in the area of chief complaint Neurologic: Sensation intact distally Psychiatric: Patient is competent for  consent with normal mood and affect Lymphatic: No axillary or cervical lymphadenopathy  MUSCULOSKELETAL: TTP medial and lateral joint lines, pain with full ROM, decreased strength right leg, + McMurray's test, NVI   Imaging: complex degenerative tear of posterior horn and body of lateral meniscus, moderate sized radial tear of posterior horn of medial meniscus with slight extrusion, and chronic MCL sprain   Assessment: RIGHT KNEE MEDIAL AND LATERAL MENISCI TEARS  Plan: Plan for Procedure(s): ARTHROSCOPY, KNEE, WITH MEDIAL MENISCECTOMY  The risks benefits and alternatives were discussed with the patient including but not limited to the risks of nonoperative treatment, versus surgical intervention including infection, bleeding, nerve injury,  blood clots, cardiopulmonary complications, morbidity, mortality, among others, and they were willing to proceed.   Weightbearing: WBAT RLE Orthopedic devices: none Showering: POD 3 Dressing: reinforce PRN Medicines: already on Warfarin (will need to stop 36 hr preop and use Lovenox  until 48-72 hours postop), Oxy 5mg  q4h PRN, and Tizanidine ; Send Norco and Zofran   Discharge: home Follow up: 04/06/24 at 10:45am    Gerard CHRISTELLA Large,  PA-C Office [phone] 03/20/2024 9:24 AM

## 2024-03-22 ENCOUNTER — Encounter (HOSPITAL_COMMUNITY)
Admission: RE | Admit: 2024-03-22 | Discharge: 2024-03-22 | Disposition: A | Discharge status: Home / Self Care | Source: Ambulatory Visit | Attending: Orthopedic Surgery | Admitting: Orthopedic Surgery

## 2024-03-22 ENCOUNTER — Other Ambulatory Visit: Payer: Self-pay

## 2024-03-22 ENCOUNTER — Encounter (HOSPITAL_COMMUNITY): Payer: Self-pay

## 2024-03-22 VITALS — BP 118/93 | HR 88 | Temp 98.3°F | Resp 16 | Ht 64.5 in | Wt 321.0 lb

## 2024-03-22 DIAGNOSIS — X58XXXA Exposure to other specified factors, initial encounter: Secondary | ICD-10-CM | POA: Diagnosis not present

## 2024-03-22 DIAGNOSIS — E119 Type 2 diabetes mellitus without complications: Secondary | ICD-10-CM | POA: Insufficient documentation

## 2024-03-22 DIAGNOSIS — F17210 Nicotine dependence, cigarettes, uncomplicated: Secondary | ICD-10-CM | POA: Diagnosis not present

## 2024-03-22 DIAGNOSIS — Z8679 Personal history of other diseases of the circulatory system: Secondary | ICD-10-CM | POA: Diagnosis not present

## 2024-03-22 DIAGNOSIS — S83241A Other tear of medial meniscus, current injury, right knee, initial encounter: Secondary | ICD-10-CM | POA: Diagnosis not present

## 2024-03-22 DIAGNOSIS — S83281A Other tear of lateral meniscus, current injury, right knee, initial encounter: Secondary | ICD-10-CM | POA: Insufficient documentation

## 2024-03-22 DIAGNOSIS — Z86711 Personal history of pulmonary embolism: Secondary | ICD-10-CM | POA: Diagnosis not present

## 2024-03-22 DIAGNOSIS — Z7901 Long term (current) use of anticoagulants: Secondary | ICD-10-CM | POA: Insufficient documentation

## 2024-03-22 DIAGNOSIS — I10 Essential (primary) hypertension: Secondary | ICD-10-CM | POA: Diagnosis not present

## 2024-03-22 DIAGNOSIS — Z01818 Encounter for other preprocedural examination: Secondary | ICD-10-CM | POA: Insufficient documentation

## 2024-03-22 HISTORY — DX: Anxiety disorder, unspecified: F41.9

## 2024-03-22 HISTORY — DX: Fibromyalgia: M79.7

## 2024-03-22 HISTORY — DX: Unspecified osteoarthritis, unspecified site: M19.90

## 2024-03-22 HISTORY — DX: Type 2 diabetes mellitus without complications: E11.9

## 2024-03-22 HISTORY — DX: Depression, unspecified: F32.A

## 2024-03-22 LAB — BASIC METABOLIC PANEL WITH GFR
Anion gap: 12 (ref 5–15)
BUN: 17 mg/dL (ref 6–20)
CO2: 24 mmol/L (ref 22–32)
Calcium: 9 mg/dL (ref 8.9–10.3)
Chloride: 101 mmol/L (ref 98–111)
Creatinine, Ser: 1.09 mg/dL — ABNORMAL HIGH (ref 0.44–1.00)
GFR, Estimated: >60 mL/min (ref >60)
Glucose, Bld: 165 mg/dL — ABNORMAL HIGH (ref 70–99)
Potassium: 4.1 mmol/L (ref 3.5–5.1)
Sodium: 137 mmol/L (ref 135–145)

## 2024-03-22 LAB — CBC
HCT: 47.1 % — ABNORMAL HIGH (ref 36.0–46.0)
Hemoglobin: 15 g/dL (ref 12.0–15.0)
MCH: 29.1 pg (ref 26.0–34.0)
MCHC: 31.8 g/dL (ref 30.0–36.0)
MCV: 91.5 fL (ref 80.0–100.0)
Platelets: 556 K/uL — ABNORMAL HIGH (ref 150–400)
RBC: 5.15 MIL/uL — ABNORMAL HIGH (ref 3.87–5.11)
RDW: 14.6 % (ref 11.5–15.5)
WBC: 12.7 K/uL — ABNORMAL HIGH (ref 4.0–10.5)
nRBC: 0 % (ref 0.0–0.2)

## 2024-03-22 LAB — GLUCOSE, CAPILLARY: Glucose-Capillary: 174 mg/dL — ABNORMAL HIGH (ref 70–99)

## 2024-03-22 NOTE — Progress Notes (Addendum)
 Anesthesia Review:  PCP: Bernita Ned 03/15/24- LOV  Cardiologist : DR Crawford- with [facility]  in [place] 02/23/24 Preop exam   PPM/ ICD: Device Orders: Rep Notified:  Chest x-ray : EKG : 02/08/24 - requested 12 lead EKG tracing by fax on 03/22/24.   Echo : 02/23/24  Monitor- 02/23/24  Stress test: Cardiac Cath :   Activity level: cannot do a flight of stairs wtihout difficutoy  Sleep Study/ CPAP : hx of Sleep Study which was negative  Fasting Blood Sugar :      / Checks Blood Sugar -- times a day:     DM- type 2- checks gluce once daily  Hgba1c-01/25/24-8.5 repeated on 03/22/24- 7.4 -routed to Dr Beverley on 03/26/24.  Lantus- Take 1/2 dose nite beofre surgery  Ozempic- last dose on 03/18/24.   Blood Thinner/ Instructions Cherre Dose: ASA / Instructions/ Last Dose :    Coumadin  for Prothrombin Mutation followed by Dr Crawford  Hx of PE x 2  Last dose Coumadin  on 03/21/2024  Lovenox  Bridge- Last dose of Lovenox  Bridge 03/26/24 in am .  PT unsure at preop when Lovenox  to start.     Bari Stretcher requested on 03/22/24.    CBC with elevated white count routed to DR Beverley on 03/22/24.   Blood pressure in lower arm

## 2024-03-23 LAB — HEMOGLOBIN A1C
Hgb A1c MFr Bld: 7.4 % — ABNORMAL HIGH (ref 4.8–5.6)
Mean Plasma Glucose: 166 mg/dL
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
## 2024-03-23 NOTE — Anesthesia Preprocedure Evaluation (Addendum)
 Anesthesia Evaluation  Patient identified by MRN, date of birth, ID band Patient awake    Reviewed: Allergy & Precautions, NPO status , Patient's Chart, lab work & pertinent test results, reviewed documented beta blocker date and time   Airway Mallampati: II  TM Distance: >3 FB     Dental no notable dental hx. (+) Teeth Intact, Dental Advisory Given   Pulmonary Current Smoker and Patient abstained from smoking., PE   breath sounds clear to auscultation + decreased breath sounds      Cardiovascular hypertension, Pt. on medications and Pt. on home beta blockers + DVT  Normal cardiovascular exam Rhythm:Regular Rate:Normal  Echo 05/30/20 1. The RV is markedly dilated and has severely reduced function .  Consider pulmonary embolus.   2. Left ventricular ejection fraction, by estimation, is >75%. The left  ventricle has hyperdynamic function. The left ventricle has no regional  wall motion abnormalities. Left ventricular diastolic parameters are  consistent with Grade I diastolic  dysfunction (impaired relaxation).   3. Right ventricular systolic function is severely reduced. The right  ventricular size is moderately enlarged.   4. The mitral valve is normal in structure. No evidence of mitral valve  regurgitation.   5. The aortic valve is normal in structure. Aortic valve regurgitation is  not visualized. No aortic stenosis is present.    EKG 06/23/20 Normal sinus rhythm Possible Anterior infarct , age undetermined   Neuro/Psych  PSYCHIATRIC DISORDERS Anxiety Depression     Neuromuscular disease    GI/Hepatic negative GI ROS, Neg liver ROS,,,  Endo/Other  diabetes, Well Controlled, Type 2, Oral Hypoglycemic Agents  Class 4 obesityGLP-1 RA therapy- Last dose 7/27 HLD  Renal/GU Renal InsufficiencyRenal disease  negative genitourinary   Musculoskeletal  (+) Arthritis , Osteoarthritis,  Fibromyalgia -Medial and lateral menisci  tears right knee   Abdominal  (+) + obese  Peds  Hematology Coumadin  therapy- last dose 7/31 Prothrombin III mutation   Anesthesia Other Findings   Reproductive/Obstetrics                              Anesthesia Physical Anesthesia Plan  ASA: 3  Anesthesia Plan: General   Post-op Pain Management: Minimal or no pain anticipated, Precedex  and Ofirmev  IV (intra-op)*   Induction: Intravenous  PONV Risk Score and Plan: 3 and Treatment may vary due to age or medical condition, Midazolam , Ondansetron , Dexamethasone  and Scopolamine  patch - Pre-op  Airway Management Planned: LMA  Additional Equipment: None  Intra-op Plan:   Post-operative Plan: Extubation in OR  Informed Consent: I have reviewed the patients History and Physical, chart, labs and discussed the procedure including the risks, benefits and alternatives for the proposed anesthesia with the patient or authorized representative who has indicated his/her understanding and acceptance.     Dental advisory given  Plan Discussed with: CRNA and Anesthesiologist  Anesthesia Plan Comments: (See PAT note 03/22/2024)         Anesthesia Quick Evaluation

## 2024-03-26 ENCOUNTER — Encounter (HOSPITAL_COMMUNITY): Payer: Self-pay | Admitting: Orthopedic Surgery

## 2024-03-27 ENCOUNTER — Ambulatory Visit (HOSPITAL_COMMUNITY): Payer: Self-pay | Admitting: Medical

## 2024-03-27 ENCOUNTER — Ambulatory Visit (HOSPITAL_COMMUNITY)
Admission: RE | Admit: 2024-03-27 | Discharge: 2024-03-27 | Disposition: A | Discharge status: Home / Self Care | Attending: Orthopedic Surgery | Admitting: Orthopedic Surgery

## 2024-03-27 ENCOUNTER — Ambulatory Visit (HOSPITAL_COMMUNITY): Payer: Self-pay | Admitting: Physician Assistant

## 2024-03-27 ENCOUNTER — Encounter (HOSPITAL_COMMUNITY): Payer: Self-pay | Admitting: Orthopedic Surgery

## 2024-03-27 ENCOUNTER — Encounter (HOSPITAL_COMMUNITY)
Admission: RE | Disposition: A | Discharge status: Home / Self Care | Payer: Self-pay | Source: Home / Self Care | Attending: Orthopedic Surgery

## 2024-03-27 DIAGNOSIS — I10 Essential (primary) hypertension: Secondary | ICD-10-CM | POA: Diagnosis not present

## 2024-03-27 DIAGNOSIS — S83241A Other tear of medial meniscus, current injury, right knee, initial encounter: Secondary | ICD-10-CM

## 2024-03-27 DIAGNOSIS — E119 Type 2 diabetes mellitus without complications: Secondary | ICD-10-CM | POA: Diagnosis not present

## 2024-03-27 DIAGNOSIS — M797 Fibromyalgia: Secondary | ICD-10-CM | POA: Diagnosis not present

## 2024-03-27 DIAGNOSIS — S83281A Other tear of lateral meniscus, current injury, right knee, initial encounter: Secondary | ICD-10-CM | POA: Diagnosis not present

## 2024-03-27 DIAGNOSIS — Z7901 Long term (current) use of anticoagulants: Secondary | ICD-10-CM | POA: Insufficient documentation

## 2024-03-27 DIAGNOSIS — Z7985 Long-term (current) use of injectable non-insulin antidiabetic drugs: Secondary | ICD-10-CM | POA: Insufficient documentation

## 2024-03-27 DIAGNOSIS — F17210 Nicotine dependence, cigarettes, uncomplicated: Secondary | ICD-10-CM | POA: Insufficient documentation

## 2024-03-27 DIAGNOSIS — Z7984 Long term (current) use of oral hypoglycemic drugs: Secondary | ICD-10-CM | POA: Diagnosis not present

## 2024-03-27 DIAGNOSIS — Z01818 Encounter for other preprocedural examination: Secondary | ICD-10-CM

## 2024-03-27 DIAGNOSIS — M1990 Unspecified osteoarthritis, unspecified site: Secondary | ICD-10-CM | POA: Diagnosis not present

## 2024-03-27 DIAGNOSIS — Z86711 Personal history of pulmonary embolism: Secondary | ICD-10-CM | POA: Diagnosis not present

## 2024-03-27 DIAGNOSIS — E785 Hyperlipidemia, unspecified: Secondary | ICD-10-CM | POA: Insufficient documentation

## 2024-03-27 DIAGNOSIS — S83231A Complex tear of medial meniscus, current injury, right knee, initial encounter: Secondary | ICD-10-CM

## 2024-03-27 DIAGNOSIS — X58XXXA Exposure to other specified factors, initial encounter: Secondary | ICD-10-CM | POA: Diagnosis not present

## 2024-03-27 DIAGNOSIS — F418 Other specified anxiety disorders: Secondary | ICD-10-CM | POA: Diagnosis not present

## 2024-03-27 DIAGNOSIS — I749 Embolism and thrombosis of unspecified artery: Secondary | ICD-10-CM

## 2024-03-27 DIAGNOSIS — Z79899 Other long term (current) drug therapy: Secondary | ICD-10-CM | POA: Insufficient documentation

## 2024-03-27 HISTORY — PX: KNEE ARTHROSCOPY WITH MEDIAL MENISECTOMY: SHX5651

## 2024-03-27 LAB — GLUCOSE, CAPILLARY
Glucose-Capillary: 193 mg/dL — ABNORMAL HIGH (ref 70–99)
Glucose-Capillary: 147 mg/dL — ABNORMAL HIGH (ref 70–99)

## 2024-03-27 LAB — PROTIME-INR
INR: 1.1 (ref 0.8–1.2)
Prothrombin Time: 14.4 s (ref 11.4–15.2)

## 2024-03-27 SURGERY — ARTHROSCOPY, KNEE, WITH MEDIAL MENISCECTOMY
Anesthesia: General | Site: Knee | Laterality: Right

## 2024-03-27 MED ORDER — FENTANYL CITRATE PF 50 MCG/ML IJ SOSY
50.0000 ug | PREFILLED_SYRINGE | Freq: Once | INTRAMUSCULAR | Status: DC
  Filled 2024-03-27: qty 2

## 2024-03-27 MED ORDER — DROPERIDOL 2.5 MG/ML IJ SOLN: 0.6250 mg | Freq: Once | INTRAMUSCULAR | Status: DC | PRN

## 2024-03-27 MED ORDER — LACTATED RINGERS IV SOLN: INTRAVENOUS | Status: DC

## 2024-03-27 MED ORDER — PROPOFOL 10 MG/ML IV BOLUS
INTRAVENOUS | Status: DC | PRN
  Administered 2024-03-27: 200 mg via INTRAVENOUS
  Administered 2024-03-27: 50 mg via INTRAVENOUS

## 2024-03-27 MED ORDER — MIDAZOLAM HCL 2 MG/2ML IJ SOLN
INTRAMUSCULAR | Status: AC
  Filled 2024-03-27: qty 2

## 2024-03-27 MED ORDER — BUPIVACAINE-EPINEPHRINE (PF) 0.5% -1:200000 IJ SOLN
INTRAMUSCULAR | Status: AC
Start: 2024-03-27 — End: 2024-03-27
  Filled 2024-03-27: qty 30

## 2024-03-27 MED ORDER — DEXAMETHASONE SODIUM PHOSPHATE 4 MG/ML IJ SOLN
INTRAMUSCULAR | Status: DC | PRN
Start: 2024-03-27 — End: 2024-03-27
  Administered 2024-03-27: 5 mg via INTRAVENOUS

## 2024-03-27 MED ORDER — ONDANSETRON HCL 4 MG/2ML IJ SOLN: 4.0000 mg | Freq: Once | INTRAMUSCULAR | Status: DC | PRN

## 2024-03-27 MED ORDER — ONDANSETRON HCL 4 MG/2ML IJ SOLN
INTRAMUSCULAR | Status: AC
  Filled 2024-03-27: qty 2

## 2024-03-27 MED ORDER — SODIUM CHLORIDE 0.9 % IR SOLN
Status: DC | PRN
  Administered 2024-03-27: 6000 mL

## 2024-03-27 MED ORDER — SCOPOLAMINE 1 MG/3DAYS TD PT72
MEDICATED_PATCH | TRANSDERMAL | Status: DC | PRN
  Administered 2024-03-27: 1 via TRANSDERMAL

## 2024-03-27 MED ORDER — OXYCODONE HCL 5 MG PO TABS: 5.0000 mg | ORAL_TABLET | Freq: Once | ORAL | Status: DC | PRN

## 2024-03-27 MED ORDER — FENTANYL CITRATE (PF) 100 MCG/2ML IJ SOLN
INTRAMUSCULAR | Status: AC
  Filled 2024-03-27: qty 2

## 2024-03-27 MED ORDER — OXYCODONE HCL 5 MG/5ML PO SOLN: 5.0000 mg | Freq: Once | ORAL | Status: DC | PRN

## 2024-03-27 MED ORDER — HYDROMORPHONE HCL 1 MG/ML IJ SOLN
0.2500 mg | INTRAMUSCULAR | Status: DC | PRN
  Administered 2024-03-27 (×2): 0.25 mg via INTRAVENOUS

## 2024-03-27 MED ORDER — CEFAZOLIN SODIUM-DEXTROSE 3-4 GM/150ML-% IV SOLN
3.0000 g | INTRAVENOUS | Status: AC
  Administered 2024-03-27: 3 g via INTRAVENOUS
  Filled 2024-03-27: qty 150

## 2024-03-27 MED ORDER — ONDANSETRON 4 MG PO TBDP: 4.0000 mg | ORAL_TABLET | Freq: Three times a day (TID) | ORAL | 0 refills | Status: AC | PRN

## 2024-03-27 MED ORDER — METHYLPREDNISOLONE ACETATE 40 MG/ML IJ SUSP
INTRAMUSCULAR | Status: AC
Start: 2024-03-27 — End: 2024-03-27
  Filled 2024-03-27: qty 1

## 2024-03-27 MED ORDER — METHYLPREDNISOLONE ACETATE 80 MG/ML IJ SUSP
INTRAMUSCULAR | Status: DC | PRN
  Administered 2024-03-27: 40 mg

## 2024-03-27 MED ORDER — LIDOCAINE HCL (CARDIAC) PF 100 MG/5ML IV SOSY
PREFILLED_SYRINGE | INTRAVENOUS | Status: DC | PRN
  Administered 2024-03-27: 100 mg via INTRATRACHEAL

## 2024-03-27 MED ORDER — TRIAMCINOLONE ACETONIDE 40 MG/ML IJ SUSP
INTRAMUSCULAR | Status: AC
Start: 2024-03-27 — End: 2024-03-27
  Filled 2024-03-27: qty 1

## 2024-03-27 MED ORDER — PROPOFOL 500 MG/50ML IV EMUL
INTRAVENOUS | Status: AC
Start: 2024-03-27 — End: 2024-03-27
  Filled 2024-03-27: qty 50

## 2024-03-27 MED ORDER — ORAL CARE MOUTH RINSE: 15.0000 mL | Freq: Once | OROMUCOSAL | Status: AC

## 2024-03-27 MED ORDER — LIDOCAINE HCL (PF) 2 % IJ SOLN
INTRAMUSCULAR | Status: AC
  Filled 2024-03-27: qty 5

## 2024-03-27 MED ORDER — BUPIVACAINE-EPINEPHRINE 0.5% -1:200000 IJ SOLN
INTRAMUSCULAR | Status: DC | PRN
  Administered 2024-03-27: 4 mL

## 2024-03-27 MED ORDER — DEXAMETHASONE SODIUM PHOSPHATE 10 MG/ML IJ SOLN
INTRAMUSCULAR | Status: AC
  Filled 2024-03-27: qty 1

## 2024-03-27 MED ORDER — ONDANSETRON HCL 4 MG/2ML IJ SOLN
INTRAMUSCULAR | Status: DC | PRN
  Administered 2024-03-27: 4 mg via INTRAVENOUS

## 2024-03-27 MED ORDER — HYDROCODONE-ACETAMINOPHEN 10-325 MG PO TABS: 1.0000 | ORAL_TABLET | Freq: Three times a day (TID) | ORAL | 0 refills | Status: AC | PRN

## 2024-03-27 MED ORDER — DEXMEDETOMIDINE HCL IN NACL 80 MCG/20ML IV SOLN
INTRAVENOUS | Status: DC | PRN
  Administered 2024-03-27: 8 ug via INTRAVENOUS

## 2024-03-27 MED ORDER — MIDAZOLAM HCL 2 MG/2ML IJ SOLN
INTRAMUSCULAR | Status: DC | PRN
  Administered 2024-03-27: 2 mg via INTRAVENOUS

## 2024-03-27 MED ORDER — POVIDONE-IODINE 10 % EX SWAB: 2.0000 | Freq: Once | CUTANEOUS | Status: DC

## 2024-03-27 MED ORDER — HYDROMORPHONE HCL 1 MG/ML IJ SOLN
INTRAMUSCULAR | Status: AC
  Filled 2024-03-27: qty 2

## 2024-03-27 MED ORDER — FENTANYL CITRATE (PF) 100 MCG/2ML IJ SOLN
INTRAMUSCULAR | Status: DC | PRN
  Administered 2024-03-27 (×4): 50 ug via INTRAVENOUS

## 2024-03-27 MED ORDER — ACETAMINOPHEN 500 MG PO TABS
1000.0000 mg | ORAL_TABLET | Freq: Once | ORAL | Status: AC
  Administered 2024-03-27: 1000 mg via ORAL
  Filled 2024-03-27: qty 2

## 2024-03-27 MED ORDER — INSULIN ASPART 100 UNIT/ML IJ SOLN
0.0000 [IU] | INTRAMUSCULAR | Status: DC | PRN
  Administered 2024-03-27: 4 [IU] via SUBCUTANEOUS
  Filled 2024-03-27: qty 1

## 2024-03-27 MED ORDER — SCOPOLAMINE 1 MG/3DAYS TD PT72
MEDICATED_PATCH | TRANSDERMAL | Status: AC
Start: 2024-03-27 — End: 2024-03-27
  Filled 2024-03-27: qty 1

## 2024-03-27 MED ORDER — CHLORHEXIDINE GLUCONATE 0.12 % MT SOLN
15.0000 mL | Freq: Once | OROMUCOSAL | Status: AC
  Administered 2024-03-27: 15 mL via OROMUCOSAL

## 2024-03-27 MED ORDER — DEXMEDETOMIDINE HCL IN NACL 80 MCG/20ML IV SOLN
INTRAVENOUS | Status: AC
Start: 2024-03-27 — End: 2024-03-27
  Filled 2024-03-27: qty 20

## 2024-03-27 MED ORDER — MIDAZOLAM HCL 2 MG/2ML IJ SOLN
1.0000 mg | Freq: Once | INTRAMUSCULAR | Status: DC
  Filled 2024-03-27: qty 2

## 2024-03-27 SURGICAL SUPPLY — 30 items
BAG COUNTER SPONGE SURGICOUNT (BAG) IMPLANT
BNDG ELASTIC 6INX 5YD STR LF (GAUZE/BANDAGES/DRESSINGS) IMPLANT
CHLORAPREP W/TINT 26 (MISCELLANEOUS) ×1 IMPLANT
DISSECTOR 3.8MM X 13CM (MISCELLANEOUS) ×1 IMPLANT
DRAPE SHEET LG 3/4 BI-LAMINATE (DRAPES) ×1 IMPLANT
DRAPE TOP 10253 STERILE (DRAPES) ×1 IMPLANT
DRAPE U-SHAPE 47X51 STRL (DRAPES) ×1 IMPLANT
DRSG EMULSION OIL 3X16 NADH (GAUZE/BANDAGES/DRESSINGS) ×1 IMPLANT
DRSG EMULSION OIL 3X3 NADH (GAUZE/BANDAGES/DRESSINGS) IMPLANT
EXCALIBUR 3.8MM X 13CM (MISCELLANEOUS) ×1 IMPLANT
GAUZE 4X4 16PLY ~~LOC~~+RFID DBL (SPONGE) ×1 IMPLANT
GAUZE PAD ABD 8X10 STRL (GAUZE/BANDAGES/DRESSINGS) IMPLANT
GAUZE SPONGE 4X4 12PLY STRL (GAUZE/BANDAGES/DRESSINGS) IMPLANT
GAUZE XEROFORM 1X8 LF (GAUZE/BANDAGES/DRESSINGS) IMPLANT
GLOVE BIO SURGEON STRL SZ7.5 (GLOVE) ×1 IMPLANT
GLOVE BIOGEL PI IND STRL 7.5 (GLOVE) ×1 IMPLANT
GLOVE BIOGEL PI IND STRL 8 (GLOVE) ×1 IMPLANT
GOWN STRL REUS W/ TWL LRG LVL3 (GOWN DISPOSABLE) ×2 IMPLANT
KIT BASIN OR (CUSTOM PROCEDURE TRAY) ×1 IMPLANT
KIT TURNOVER KIT A (KITS) ×1 IMPLANT
MANIFOLD NEPTUNE II (INSTRUMENTS) IMPLANT
NS IRRIG 1000ML POUR BTL (IV SOLUTION) ×1 IMPLANT
PACK ARTHROSCOPY WL (CUSTOM PROCEDURE TRAY) ×1 IMPLANT
PADDING CAST COTTON 6X4 STRL (CAST SUPPLIES) IMPLANT
SUT ETHILON 2 0 PS N (SUTURE) IMPLANT
SUT NYLON 3 0 (SUTURE) ×1 IMPLANT
SYR 10ML LL (SYRINGE) ×1 IMPLANT
TOWEL OR 17X26 10 PK STRL BLUE (TOWEL DISPOSABLE) ×1 IMPLANT
TUBING ARTHROSCOPY IRRIG 16FT (MISCELLANEOUS) ×1 IMPLANT
WATER STERILE IRR 1000ML POUR (IV SOLUTION) ×1 IMPLANT

## 2024-03-27 NOTE — Progress Notes (Signed)
 Clarified with Dr. Beverley when patient is to resume Coumadin . Patient may resume this evening.

## 2024-03-27 NOTE — Transfer of Care (Signed)
 Immediate Anesthesia Transfer of Care Note  Patient: Holly Moon  Procedure(s) Performed: ARTHROSCOPY, KNEE, WITH MEDIAL MENISCECTOMY (Right: Knee)  Patient Location: PACU  Anesthesia Type:General  Level of Consciousness: awake and patient cooperative  Airway & Oxygen Therapy: Patient Spontanous Breathing and Patient connected to face mask  Post-op Assessment: Report given to RN and Post -op Vital signs reviewed and stable  Post vital signs: Reviewed and stable  Last Vitals:  Vitals Value Taken Time  BP    Temp    Pulse 73 03/27/24 09:41  Resp 15 03/27/24 09:41  SpO2 93 % 03/27/24 09:41  Vitals shown include unfiled device data.  Last Pain:  Vitals:   03/27/24 0745  TempSrc:   PainSc: 3          Complications: No notable events documented.

## 2024-03-27 NOTE — Interval H&P Note (Signed)
 History and Physical Interval Note:  03/27/2024 7:13 AM  Holly Moon  has presented today for surgery, with the diagnosis of RIGHT KNEE MEDIAL AND LATERAL MENISCI TEARS.  The various methods of treatment have been discussed with the patient and family. After consideration of risks, benefits and other options for treatment, the patient has consented to  Procedure(s) with comments: ARTHROSCOPY, KNEE, WITH MEDIAL MENISCECTOMY (Right) - MEDIAL AND LATERAL MENISCECTOMY WITH CHONDROPLASTY as a surgical intervention.  The patient's history has been reviewed, patient examined, no change in status, stable for surgery.  I have reviewed the patient's chart and labs.  Questions were answered to the patient's satisfaction.     Holly Moon

## 2024-03-27 NOTE — Discharge Instructions (Addendum)

## 2024-03-27 NOTE — Anesthesia Postprocedure Evaluation (Signed)
 Anesthesia Post Note  Patient: Holly Moon  Procedure(s) Performed: ARTHROSCOPY, KNEE, WITH MEDIAL MENISCECTOMY (Right: Knee)     Patient location during evaluation: PACU Anesthesia Type: General Level of consciousness: awake and alert and oriented Pain management: pain level controlled Vital Signs Assessment: post-procedure vital signs reviewed and stable Respiratory status: spontaneous breathing, nonlabored ventilation and respiratory function stable Cardiovascular status: blood pressure returned to baseline and stable Postop Assessment: no apparent nausea or vomiting Anesthetic complications: no   No notable events documented.  Last Vitals:  Vitals:   03/27/24 1030 03/27/24 1037  BP: 122/78 137/69  Pulse: 66 70  Resp: 14 15  Temp: 36.5 C 36.9 C  SpO2: 94% 93%    Last Pain:  Vitals:   03/27/24 1037  TempSrc:   PainSc: 3                  Doshie Maggi A.

## 2024-03-27 NOTE — Anesthesia Procedure Notes (Signed)
 Procedure Name: LMA Insertion Date/Time: 03/27/2024 8:54 AM  Performed by: Judythe Tanda Aran, CRNAPre-anesthesia Checklist: Emergency Drugs available, Patient identified, Suction available and Patient being monitored Patient Re-evaluated:Patient Re-evaluated prior to induction Oxygen Delivery Method: Circle system utilized Preoxygenation: Pre-oxygenation with 100% oxygen Induction Type: IV induction Ventilation: Mask ventilation without difficulty LMA: LMA inserted LMA Size: 4.0 Number of attempts: 1 Placement Confirmation: positive ETCO2 and breath sounds checked- equal and bilateral Tube secured with: Tape Dental Injury: Teeth and Oropharynx as per pre-operative assessment

## 2024-03-27 NOTE — Op Note (Signed)
 03/27/2024  9:35 AM  PATIENT:  Holly Moon    PRE-OPERATIVE DIAGNOSIS:  RIGHT KNEE MEDIAL AND LATERAL MENISCI TEARS  POST-OPERATIVE DIAGNOSIS:  Same  PROCEDURE:  ARTHROSCOPY, KNEE, WITH MEDIAL MENISCECTOMY  SURGEON:  Evalene JONETTA Chancy, MD  ASSISTANT: Gerard Large, PA-C, he was present and scrubbed throughout the case, critical for completion in a timely fashion, and for retraction, instrumentation, and closure.   ANESTHESIA:   General  BLOOD LOSS: min  COMPLICATIONS: None   PREOPERATIVE INDICATIONS:  Holly Moon is a  53 y.o. female with a diagnosis of RIGHT KNEE MEDIAL AND LATERAL MENISCI TEARS who failed conservative measures and elected for surgical management.    The risks benefits and alternatives were discussed with the patient preoperatively including but not limited to the risks of infection, bleeding, nerve injury, cardiopulmonary complications, the need for revision surgery, among others, and the patient was willing to proceed.  OPERATIVE IMPLANTS: None  OPERATIVE FINDINGS: Examination under anesthesia: Stable Diagnostic Arthroscopy:  articular cartilage: Significant chondral loss in multiple compartments Medial meniscus: Posterior tear Lateral meniscus: Posterior tear Anterior cruciate ligament/PCL: Stable Loose bodies: Multiple    OPERATIVE PROCEDURE:  Patient was identified in the preoperative holding area and site was marked by me female was transported to the operating theater and placed on the table in supine position taking care to pad all bony prominences. After a preincinduction time out anesthesia was induced.  female received Ancef  for preoperative antibiotics. The right extremity was prepped and draped in normal sterile fashion and a pre-incision timeout was performed.   A small stab incision was made in the anterolateral portal position. The arthroscope was introduced in the joint. A medial portal was then established under direct  visualization just above the anterior horn of the medial meniscus. Diagnostic arthroscopy was then carried out with findings as described above.  I first remove multiple loose bodies from the super pro patellar pouch using a grabber and expanded medial portal.  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 The knee was infiltrated with Marcaine  Depo-Medrol .  Sterile dressings were then applied including Xeroform 4 x 4's ABDs an ACE bandage.  The patient was then allowed to awaken from general anesthesia, transferred to the stretcher and taken to the recovery room in stable condition.  POSTOPERATIVE PLAN: The patient will be discharged home today and will followup in one week for suture removal and wound check.  VTE prophylaxis: Mobilize resume Coumadin 

## 2024-03-28 ENCOUNTER — Encounter (HOSPITAL_COMMUNITY): Payer: Self-pay | Admitting: Orthopedic Surgery
